# Patient Record
Sex: Male | Born: 1965 | Race: Black or African American | Hispanic: No | Marital: Married | Smoking: Never smoker
Health system: Southern US, Community
[De-identification: ages and names within clinical notes are randomized; demographics above are authoritative.]

## PROBLEM LIST (undated history)

## (undated) DIAGNOSIS — C9590 Leukemia, unspecified not having achieved remission: Secondary | ICD-10-CM

## (undated) DIAGNOSIS — E785 Hyperlipidemia, unspecified: Secondary | ICD-10-CM

## (undated) DIAGNOSIS — I10 Essential (primary) hypertension: Secondary | ICD-10-CM

## (undated) HISTORY — DX: Hyperlipidemia, unspecified: E78.5

## (undated) HISTORY — PX: APPENDECTOMY: SHX54

## (undated) HISTORY — PX: CHOLECYSTECTOMY: SHX55

---

## 2003-07-23 ENCOUNTER — Emergency Department (HOSPITAL_COMMUNITY): Admission: AD | Admit: 2003-07-23 | Discharge: 2003-07-23 | Payer: Self-pay | Admitting: Emergency Medicine

## 2003-07-23 ENCOUNTER — Encounter: Payer: Self-pay | Admitting: Emergency Medicine

## 2005-01-21 ENCOUNTER — Inpatient Hospital Stay (HOSPITAL_COMMUNITY): Admission: EM | Admit: 2005-01-21 | Discharge: 2005-01-25 | Payer: Self-pay | Admitting: *Deleted

## 2009-10-24 ENCOUNTER — Inpatient Hospital Stay (HOSPITAL_COMMUNITY): Admission: EM | Admit: 2009-10-24 | Discharge: 2009-11-05 | Payer: Self-pay | Admitting: Emergency Medicine

## 2011-01-14 IMAGING — CT CT PELVIS W/ CM
1 of 3 series · 14 of 32 positions shown, 19 images · IV contrast (Omnipaque 300)
Comparison: 01/22/2005.

CT ABDOMEN

CLINICAL DATA: Nausea and vomiting for 2 days.  Abdominal pain.
Status post appendectomy and cholecystectomy.

CT ABDOMEN AND PELVIS WITH CONTRAST
TECHNIQUE: Multidetector CT imaging of the abdomen and pelvis was
performed using the standard protocol following bolus
administration of intravenous contrast.
Contrast: 100 ml [id number].

[Series 2: abd_pel_with 5.0 b40f · axial · 0.66mm/px · z∈[-508,-108]mm · 14 of 91 slices shown, 19 images]
[im 6/91  soft-tissue]
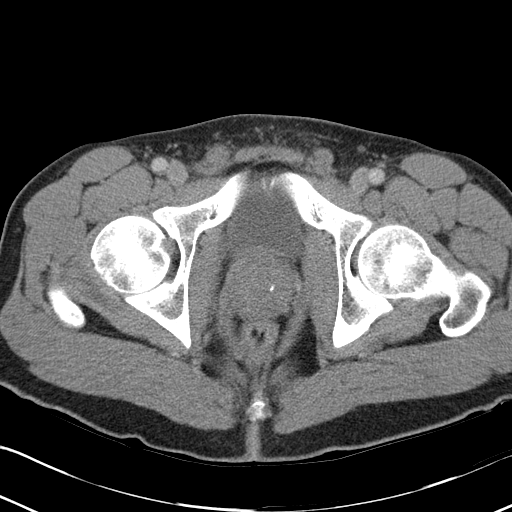
[im 6/91  bone]
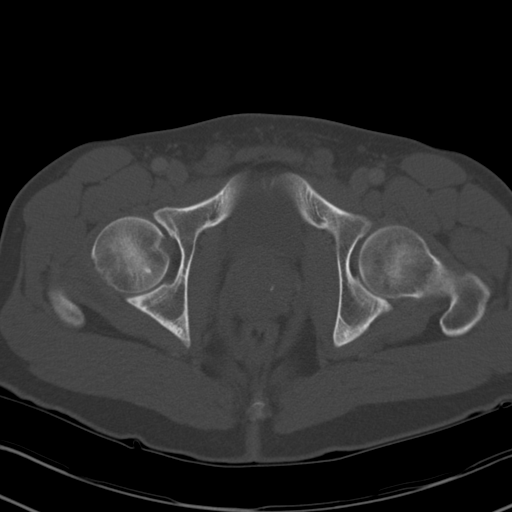
[im 11/91  soft-tissue]
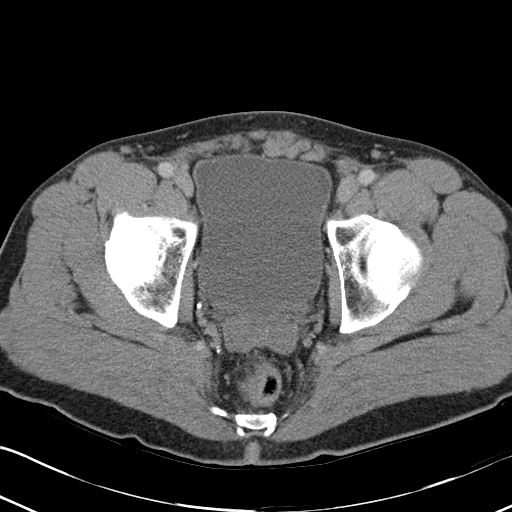
[im 21/91  soft-tissue]
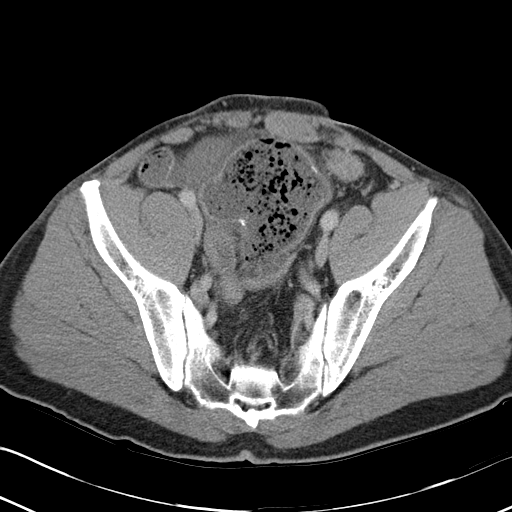
[im 26/91  soft-tissue]
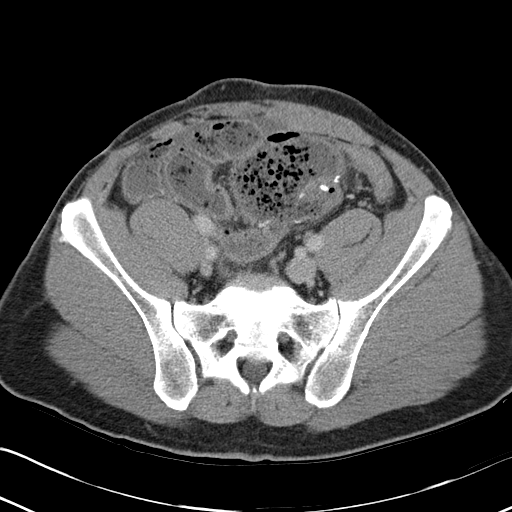
[im 31/91  soft-tissue]
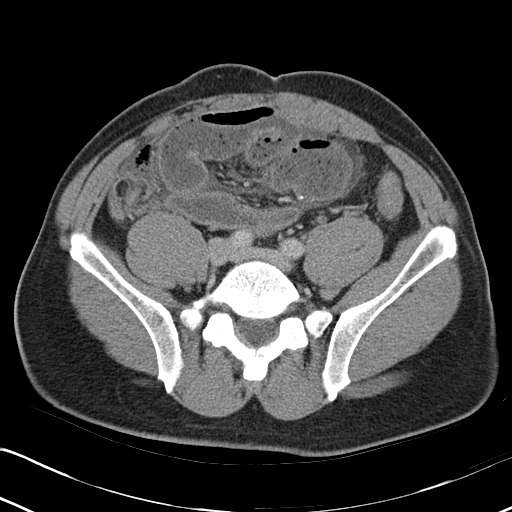
[im 41/91  soft-tissue]
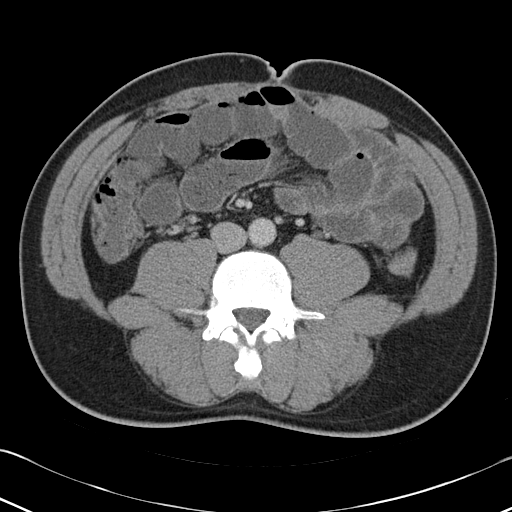
[im 46/91  soft-tissue]
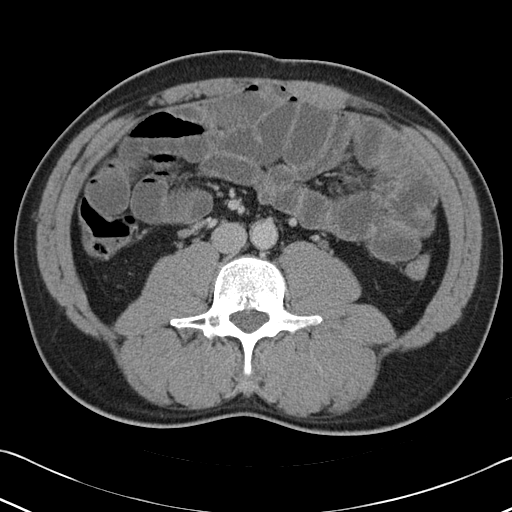
[im 51/91  soft-tissue]
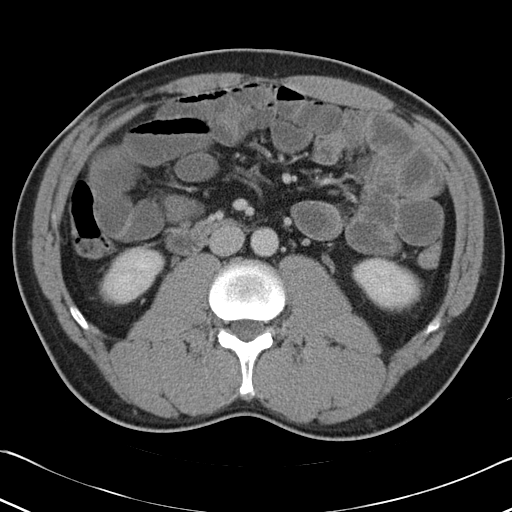
[im 61/91  soft-tissue]
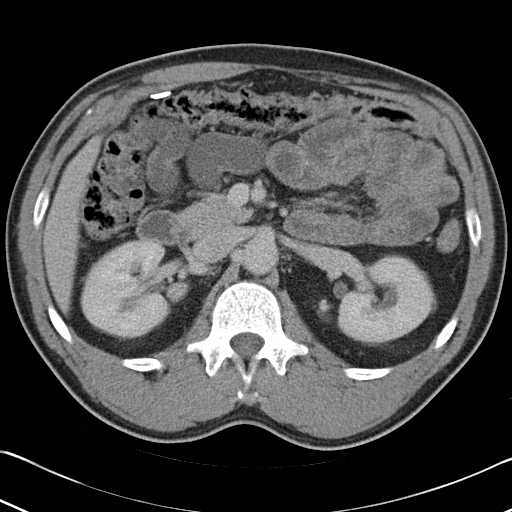
[im 61/91  bone]
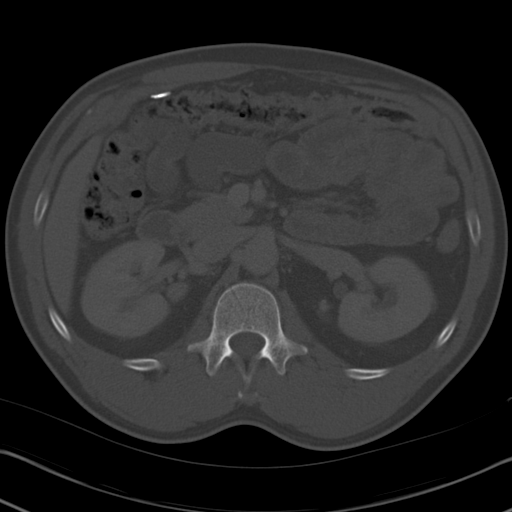
[im 66/91  soft-tissue]
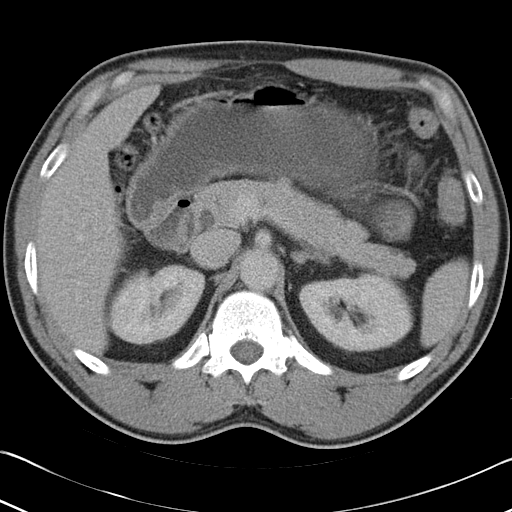
[im 71/91  soft-tissue]
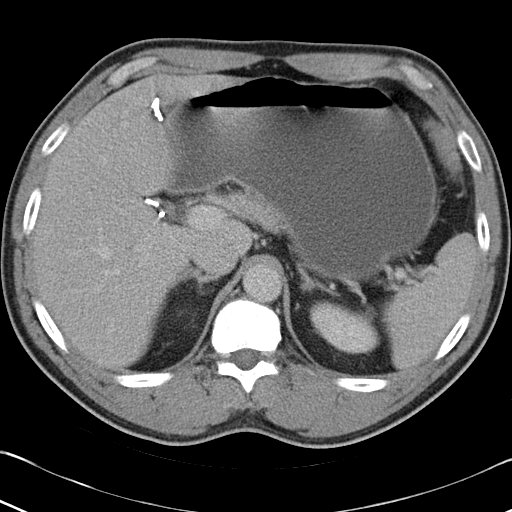
[im 71/91  lung]
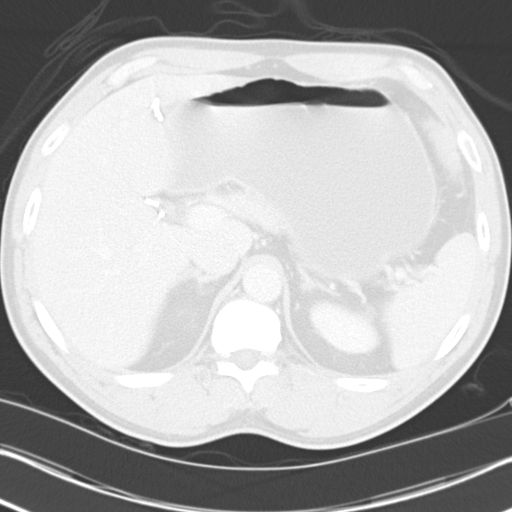
[im 76/91  lung]
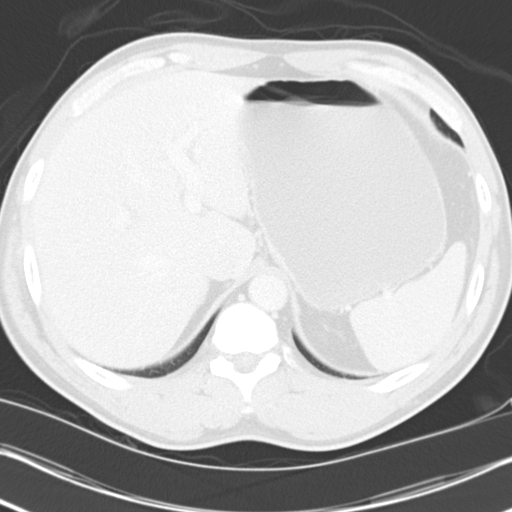
[im 81/91  soft-tissue]
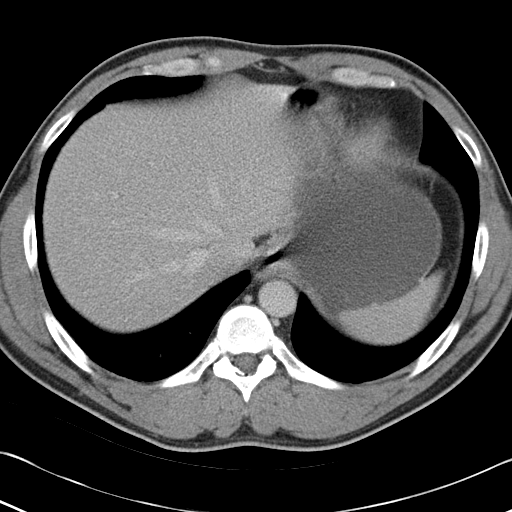
[im 81/91  lung]
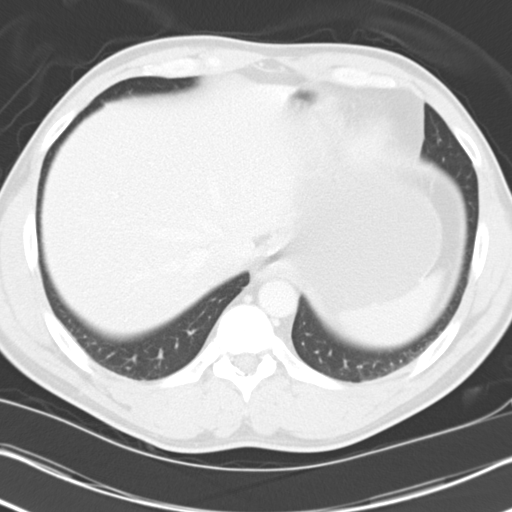
[im 86/91  soft-tissue]
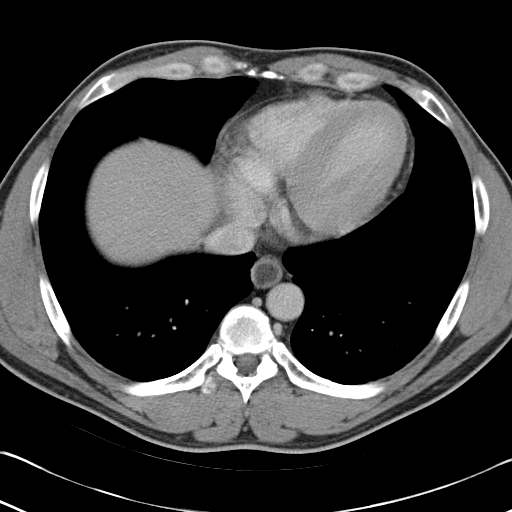
[im 86/91  lung]
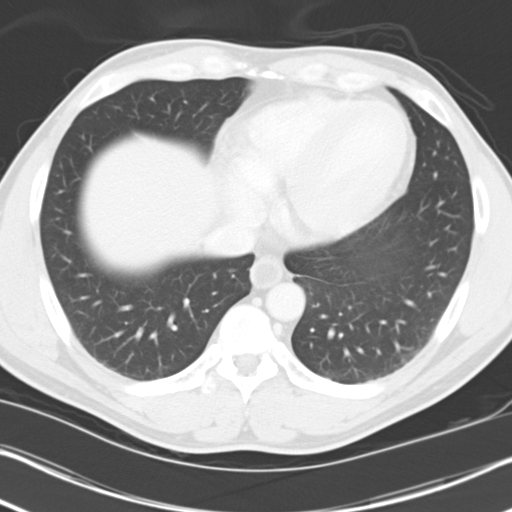

[14 of 32 positions shown; findings below may reference images not displayed]

FINDINGS: Minimal atelectatic changes lung bases.  Status post
cholecystectomy.  No focal hepatic, splenic, pancreatic or adrenal
lesion.  Duplicated left renal collecting system tear within the
left upper pole there is a 1.1 cm low density structure which
cannot be confirmed as a simple cyst but was present previously and
does not appear larger.  There are scattered smaller sub centimeter
low density lesions within both kidneys too small to adequately
characterize.

No abdominal aortic aneurysm.

Fluid filled dilated distal esophagus, stomach and small bowel to
the level of the right lower quadrant.  The right colon is
decompressed.  It is possible this small bowel obstruction is
related to adhesions.  No free intraperitoneal air or abscess
collection.  No bony destructive lesion.
IMPRESSION: Small bowel obstructive pattern.  Question if this is related to
adhesions?

Sub centimeter renal lesions too small to adequately characterize.

CT PELVIS
FINDINGS: Status post colonic surgery.  Moderate amount of stool
in the rectosigmoid region at the level of the surgery.
Decompressed remainder of colon.  Bladder appears to be grossly
within normal limits.
IMPRESSION: Prior colonic surgery with prominent stool at the surgical site
(question if the patient had pouch formation).  The remainder of
the colon is decompressed.

## 2011-02-14 LAB — BASIC METABOLIC PANEL
BUN: 10 mg/dL (ref 6–23)
BUN: 11 mg/dL (ref 6–23)
BUN: 6 mg/dL (ref 6–23)
BUN: 6 mg/dL (ref 6–23)
CO2: 25 mEq/L (ref 19–32)
CO2: 27 mEq/L (ref 19–32)
Calcium: 8.4 mg/dL (ref 8.4–10.5)
Calcium: 8.4 mg/dL (ref 8.4–10.5)
Calcium: 8.6 mg/dL (ref 8.4–10.5)
Chloride: 100 mEq/L (ref 96–112)
Chloride: 100 mEq/L (ref 96–112)
Creatinine, Ser: 0.63 mg/dL (ref 0.4–1.5)
Creatinine, Ser: 0.86 mg/dL (ref 0.4–1.5)
Creatinine, Ser: 0.88 mg/dL (ref 0.4–1.5)
GFR calc Af Amer: >60 mL/min (ref >60)
GFR calc Af Amer: >60 mL/min (ref >60)
GFR calc Af Amer: >60 mL/min (ref >60)
GFR calc Af Amer: >60 mL/min (ref >60)
GFR calc non Af Amer: >60 mL/min (ref >60)
GFR calc non Af Amer: >60 mL/min (ref >60)
GFR calc non Af Amer: >60 mL/min (ref >60)
Glucose, Bld: 128 mg/dL — ABNORMAL HIGH (ref 70–99)
Glucose, Bld: 144 mg/dL — ABNORMAL HIGH (ref 70–99)
Glucose, Bld: 78 mg/dL (ref 70–99)
Potassium: 3.4 mEq/L — ABNORMAL LOW (ref 3.5–5.1)
Potassium: 3.6 mEq/L (ref 3.5–5.1)
Potassium: 3.8 mEq/L (ref 3.5–5.1)
Sodium: 133 mEq/L — ABNORMAL LOW (ref 135–145)
Sodium: 135 mEq/L (ref 135–145)
Sodium: 137 mEq/L (ref 135–145)

## 2011-02-14 LAB — DIFFERENTIAL
Basophils Absolute: 0 10*3/uL (ref 0.0–0.1)
Basophils Absolute: 0 10*3/uL (ref 0.0–0.1)
Basophils Absolute: 0 10*3/uL (ref 0.0–0.1)
Basophils Absolute: 0 10*3/uL (ref 0.0–0.1)
Basophils Relative: 0 % (ref 0–1)
Basophils Relative: 0 % (ref 0–1)
Basophils Relative: 0 % (ref 0–1)
Eosinophils Absolute: 0.1 10*3/uL (ref 0.0–0.7)
Eosinophils Absolute: 0.1 10*3/uL (ref 0.0–0.7)
Eosinophils Relative: 2 % (ref 0–5)
Eosinophils Relative: 2 % (ref 0–5)
Lymphocytes Relative: 11 % — ABNORMAL LOW (ref 12–46)
Lymphs Abs: 0.8 10*3/uL (ref 0.7–4.0)
Lymphs Abs: 0.9 10*3/uL (ref 0.7–4.0)
Monocytes Absolute: 0.8 10*3/uL (ref 0.1–1.0)
Monocytes Absolute: 1.2 10*3/uL — ABNORMAL HIGH (ref 0.1–1.0)
Monocytes Relative: 12 % (ref 3–12)
Monocytes Relative: 13 % — ABNORMAL HIGH (ref 3–12)
Monocytes Relative: 15 % — ABNORMAL HIGH (ref 3–12)
Neutro Abs: 4.4 10*3/uL (ref 1.7–7.7)
Neutro Abs: 4.9 10*3/uL (ref 1.7–7.7)
Neutro Abs: 5.9 10*3/uL (ref 1.7–7.7)
Neutrophils Relative %: 70 % (ref 43–77)
Neutrophils Relative %: 72 % (ref 43–77)
Neutrophils Relative %: 78 % — ABNORMAL HIGH (ref 43–77)

## 2011-02-14 LAB — CBC
HCT: 33 % — ABNORMAL LOW (ref 39.0–52.0)
HCT: 35.8 % — ABNORMAL LOW (ref 39.0–52.0)
HCT: 36.5 % — ABNORMAL LOW (ref 39.0–52.0)
HCT: 39.7 % (ref 39.0–52.0)
Hemoglobin: 11.1 g/dL — ABNORMAL LOW (ref 13.0–17.0)
Hemoglobin: 11.9 g/dL — ABNORMAL LOW (ref 13.0–17.0)
Hemoglobin: 12.2 g/dL — ABNORMAL LOW (ref 13.0–17.0)
Hemoglobin: 13 g/dL (ref 13.0–17.0)
MCHC: 32.8 g/dL (ref 30.0–36.0)
MCHC: 33.8 g/dL (ref 30.0–36.0)
MCV: 85.6 fL (ref 78.0–100.0)
MCV: 85.6 fL (ref 78.0–100.0)
MCV: 86 fL (ref 78.0–100.0)
Platelets: 159 10*3/uL (ref 150–400)
Platelets: 169 10*3/uL (ref 150–400)
Platelets: 181 10*3/uL (ref 150–400)
RBC: 3.85 MIL/uL — ABNORMAL LOW (ref 4.22–5.81)
RBC: 4.23 MIL/uL (ref 4.22–5.81)
RDW: 12.9 % (ref 11.5–15.5)
RDW: 13 % (ref 11.5–15.5)
RDW: 13.2 % (ref 11.5–15.5)
RDW: 13.6 % (ref 11.5–15.5)
WBC: 6.3 10*3/uL (ref 4.0–10.5)
WBC: 6.8 10*3/uL (ref 4.0–10.5)

## 2011-02-14 LAB — MAGNESIUM
Magnesium: 1.8 mg/dL (ref 1.5–2.5)
Magnesium: 1.8 mg/dL (ref 1.5–2.5)

## 2011-02-14 LAB — PHOSPHORUS: Phosphorus: 4.2 mg/dL (ref 2.3–4.6)

## 2011-02-15 LAB — URINE MICROSCOPIC-ADD ON

## 2011-02-15 LAB — CBC
HCT: 38.1 % — ABNORMAL LOW (ref 39.0–52.0)
HCT: 42 % (ref 39.0–52.0)
Hemoglobin: 12.8 g/dL — ABNORMAL LOW (ref 13.0–17.0)
Hemoglobin: 14 g/dL (ref 13.0–17.0)
MCHC: 33.3 g/dL (ref 30.0–36.0)
MCHC: 33.7 g/dL (ref 30.0–36.0)
MCV: 85.6 fL (ref 78.0–100.0)
MCV: 86.4 fL (ref 78.0–100.0)
Platelets: 158 10*3/uL (ref 150–400)
Platelets: 160 10*3/uL (ref 150–400)
RDW: 12.9 % (ref 11.5–15.5)
RDW: 13 % (ref 11.5–15.5)
WBC: 10.9 10*3/uL — ABNORMAL HIGH (ref 4.0–10.5)

## 2011-02-15 LAB — DIFFERENTIAL
Basophils Absolute: 0 10*3/uL (ref 0.0–0.1)
Basophils Relative: 0 % (ref 0–1)
Basophils Relative: 0 % (ref 0–1)
Eosinophils Absolute: 0 10*3/uL (ref 0.0–0.7)
Eosinophils Relative: 1 % (ref 0–5)
Lymphs Abs: 0.5 10*3/uL — ABNORMAL LOW (ref 0.7–4.0)
Lymphs Abs: 1.2 10*3/uL (ref 0.7–4.0)
Monocytes Absolute: 0.2 10*3/uL (ref 0.1–1.0)
Monocytes Absolute: 0.7 10*3/uL (ref 0.1–1.0)
Monocytes Relative: 2 % — ABNORMAL LOW (ref 3–12)
Neutro Abs: 7.7 10*3/uL (ref 1.7–7.7)
Neutrophils Relative %: 82 % — ABNORMAL HIGH (ref 43–77)
Neutrophils Relative %: 91 % — ABNORMAL HIGH (ref 43–77)

## 2011-02-15 LAB — URINALYSIS, ROUTINE W REFLEX MICROSCOPIC
Bilirubin Urine: NEGATIVE
Glucose, UA: NEGATIVE mg/dL
Leukocytes, UA: NEGATIVE
Protein, ur: NEGATIVE mg/dL
Specific Gravity, Urine: 1.015 (ref 1.005–1.030)
Urobilinogen, UA: 0.2 mg/dL (ref 0.0–1.0)
pH: 7 (ref 5.0–8.0)

## 2011-02-15 LAB — BASIC METABOLIC PANEL
BUN: 7 mg/dL (ref 6–23)
CO2: 25 mEq/L (ref 19–32)
Calcium: 8.6 mg/dL (ref 8.4–10.5)
Chloride: 100 mEq/L (ref 96–112)
Creatinine, Ser: 0.83 mg/dL (ref 0.4–1.5)
Glucose, Bld: 82 mg/dL (ref 70–99)
Potassium: 4 mEq/L (ref 3.5–5.1)
Sodium: 137 mEq/L (ref 135–145)

## 2011-02-15 LAB — STONE ANALYSIS: Stone Weight KSTONE: 0.114 g

## 2011-02-15 LAB — COMPREHENSIVE METABOLIC PANEL
Albumin: 4.1 g/dL (ref 3.5–5.2)
BUN: 8 mg/dL (ref 6–23)
Calcium: 9.4 mg/dL (ref 8.4–10.5)
Glucose, Bld: 119 mg/dL — ABNORMAL HIGH (ref 70–99)
Sodium: 137 mEq/L (ref 135–145)
Total Protein: 8 g/dL (ref 6.0–8.3)

## 2011-02-15 LAB — LACTIC ACID, PLASMA: Lactic Acid, Venous: 2.7 mmol/L — ABNORMAL HIGH (ref 0.5–2.2)

## 2011-02-15 LAB — URINE CULTURE
Colony Count: NO GROWTH
Culture: NO GROWTH

## 2011-04-01 NOTE — Discharge Summary (Signed)
Bryce Hamilton, Bryce Hamilton               ACCOUNT NO.:  [id number]   MEDICAL RECORD NO.:  [id number]          PATIENT TYPE:  INP   LOCATION:  A226                          FACILITY:  [facility]   PHYSICIAN:  Osvaldo Shipper, MD     DATE OF BIRTH:  1966-08-06   DATE OF ADMISSION:  01/21/2005  DATE OF DISCHARGE:  03/14/2006LH                                 DISCHARGE SUMMARY   PRIMARY CARE PHYSICIAN:  The patient does not have a primary care doctor.   DISCHARGE DIAGNOSIS:  Adynamic ileus versus partial small bowel obstruction.   HISTORY OF PRESENT ILLNESS:  Please review the history and physical dictated  on January 21, 2005 for details regarding the patient's presenting illness.   BRIEF HOSPITAL COURSE:  The patient is a 45 year old African-American male  with no significant medical history, however, history of abdominal surgeries  including appendectomy and cholecystectomy and for adhesions in the past.  The patient presented with acute abdominal pain and nausea.  The patient  also had a few episodes of emesis.  The patient was admitted with diagnosis  of partial small bowel obstruction versus ileus.  The patient was kept NPO,  was started on antiemetics.  He was put on intravenous fluids.  Dr. Elpidio Anis was consulted to see the patient.  The patient underwent serial  abdominal series, also underwent a CT scan of his abdomen as well as a small  bowel follow through.  All of these studies suggested improvement in his  obstruction and actually the small bowel follow through showed slow transit  time but no evidence for any acute obstruction.  The patient also improved  clinically over the course of this admission with all of his symptoms  resolving.  He was started on a p.o. diet last night and he was tolerated  the dinner as well as breakfast at the time of discharge.  His vital signs  are all stable.  He does not have any nausea or vomiting or any abdominal  pain and he is considered stable for  discharge.   DISCHARGE MEDICATIONS:  The only thing patient needs to take is Reglan 10 mg  q.a.c. and q.h.s.   DIET:  Regular.   ACTIVITY:  As tolerated.   FOLLOW UP CARE:  Follow up with Dr. Elpidio Anis in two to three weeks.  An  appointment will be set up for him.   The patient has been given a note to stay off work until Monday, January 31, 2005.      GK/MEDQ  D:  01/25/2005  T:  01/25/2005  Job:  [id number]   cc:   Dirk Dress. Katrinka Blazing, M.D.  [address]  [place]  [place] [address]  Fax: [phone]

## 2011-04-01 NOTE — Consult Note (Signed)
[name], Hamilton               ACCOUNT NO.:  [id number]   MEDICAL RECORD NO.:  [id number]          PATIENT TYPE:  INP   LOCATION:  A226                          FACILITY:  [facility]   PHYSICIAN:  Dirk Dress. Katrinka Blazing, M.D.   DATE OF BIRTH:  02/14/1966   DATE OF CONSULTATION:  01/21/2005  DATE OF DISCHARGE:                                   CONSULTATION   Bryce Hamilton is a 45 year old male admitted for evaluation of severe  abdominal pain with pattern suggestive of either partial obstruction or  ileus.  The patient gives a history of having onset of severe cramping,  suprapubic abdominal pain after eating on the evening of 8 March.  The pain  progressed throughout the night, but he tried to go to work the next day.  He had severe cramping, abdominal pain, with nausea and vomiting on that  night and in the morning and was unable to work.  He returned from work and  was able to go to bed but had persistent pain with nausea and vomiting.  He  was seen in the emergency room in the early morning of 10 March with  complaint of diffuse crampy abdominal pain and nausea.  His workup  suggestive adynamic ileus, and the patient is admitted for evaluation.   PAST MEDICAL HISTORY:  1.  The patient had a ruptured appendix at age 41.  Based on his abdominal      scars and his history, he had total abdominal peritonitis requiring      continuous peritoneal lavage and extensive drainage.  He was      hospitalized for greater than two months at that time.  He recovered      from that and has done relatively well except that he has had two      exploratory laparotomies for small-bowel obstruction, all related to the      major inflammatory process in the right lower quadrant according to the      patient.  2.  He had a cholecystectomy done which, according to history, was done      laparoscopically and was done uneventfully.  This is remarkable in that      his history suggests that he had acute  peritonitis.   His medical history otherwise is unremarkable.  He has no other medical  illnesses, and he takes no chronic medications.   PHYSICAL EXAMINATION:  GENERAL:  He is lying quietly in bed.  He does not  appear to be in any acute distress.  VITAL SIGNS:  Blood pressure 139/88, pulse 90, respirations 18.  Temperature  98.2.  HEENT:  Unremarkable.  NECK:  Supple.  No JVD, bruit, adenopathy, or thyromegaly.  CHEST:  Clear to auscultation.  No rales, rubs, rhonchi, or wheezes.  HEART:  Regular rate and rhythm without murmur, gallop, or rub.  Normal S1  and S2.  ABDOMEN:  Nondistended.  He has no tenderness by palpation.  He has  hyperactive bowel sounds.  The patient has a right upper quadrant transverse  midline incision extending from mid epigastric area down to  the suprapubic  area.  There is a right lower quadrant paramedian incision, a tangential  incision in both lower quadrants following the course of the inguinal  ligament.  He has multiple healed puncture sites from suspected previous  drains.  There is no evidence of hernia except the superior-most aspect of  the midline incision bulges would be suspect.  EXTREMITIES:  No cyanosis, clubbing, or edema.  NEUROLOGIC:  Nonfocal.   LABORATORY DATA AND OTHER STUDIES:  Abdominal series shows dilated colon and  small bowel.  There is gas in the colon, starting in the cecum and extending  all the way down into the rectum.  There are dilated loops of small bowel  scattered throughout the mid abdomen.  On the upright film, there are  differential air/fluid levels, and the colon is well into the small bowel.   IMPRESSION:  Suspected adynamic ileus due to gastroenteritis  Doubt complete  bowel obstruction at this time.  There is the possibility of partial  obstruction; however, with the extensive amount of gas in the colon, it is  quite apparent that some gas is getting through.   RECOMMENDATIONS:  The patient should have serial  abdominal series.  If he is  not clinically improved in the morning, CT of the abdomen with oral contrast  should be done to check the possibility of strictured small bowel.  If he  should develop nausea and vomiting, a nasogastric tube  with decompression  of his stomach and the proximal small bowel probably will facilitate his  improvement.  Reglan has been added IV. This only works in the stomach and  the proximal small bowel, but increased propulsive force proximally usually  helps emptying facility.  I will follow the patient closely, but I doubt if  he will need surgical therapy.      LCS/MEDQ  D:  01/21/2005  T:  01/21/2005  Job:  [id number]

## 2011-04-01 NOTE — H&P (Signed)
Bryce Hamilton, Bryce Hamilton               ACCOUNT NO.:  [id number]   MEDICAL RECORD NO.:  [id number]          PATIENT TYPE:  INP   LOCATION:  A226                          FACILITY:  [facility]   PHYSICIAN:  Margaretmary Dys, M.D.DATE OF BIRTH:  1966/07/02   DATE OF ADMISSION:  01/21/2005  DATE OF DISCHARGE:  LH                                HISTORY & PHYSICAL   ADMISSION DIAGNOSES:  1.  Acute abdominal pain.  2.  Small-bowel obstruction, likely secondary to adhesions.  3.  Obstipation.   CHIEF COMPLAINT:  Abdominal pain of 3 days' duration.   HISTORY OF PRESENT ILLNESS:  Bryce Hamilton is a 45 year old African  American male who presented to the emergency room earlier this morning,  complaining of diffuse abdominal pain.  He describes the pain as being  generalized all over his abdomen, sharp to crampy-like occasionally, seems  to be partially relieved when he passes gas.  He has not had any bowel  movement since Wednesday night; at the time, his bowel movements were a  little harder than normal.  He denies any diarrhea.  He did have some nausea  and actually vomited on Wednesday night, but has not vomited since.   He denies any fever, chills or rigors.  There is no history of  gastroenteritis in the members of his family.   The patient has had multiple surgeries in the past complicated by adhesions  and actually had 2 surgeries to release these adhesions in the past.   The patient currently rates his pain as a 5-6/10.  He also feels bloated.  The pain radiates to the back occasionally.   He says the pain is a little different in character from what he had when he  had adhesion surgery.  He did note that he had a lot of vomiting then.   He has no cough, no shortness of breath.  No headache, dizziness or  lightheadedness.  No skin changes.  No pedal edema.   REVIEW OF SYSTEMS:  A 10-point review of systems was otherwise negative  except as mentioned in the history of present  illness above.   PAST MEDICAL HISTORY:  1.  Ruptured appendix when he was 45 years old.  2.  Gallbladder surgery with cholecystectomy.  3.  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   He denies any history of hypertension, diabetes or cholesterol.   MEDICATIONS:  He does not take any medicines.   FAMILY HISTORY:  His father died from leukemia at the age of 11.  The mother  is 65 years old and she is alive and well.  He has 5 other siblings who are  doing very well as far as he knows.   ALLERGIES:  He has no known drug allergies.   SOCIAL HISTORY:  He is married.  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  He has 3 kids who are healthy with no medical problems.  He  does not smoke, does not drink alcohol, denies any IV drug abuse.   PHYSICAL EXAM:  GENERAL:  Conscious, alert, comfortable, not in acute  distress.  VITAL SIGNS:  Blood pressure was 123/77, pulse of 88, respiratory rate was  18, temperature 97.7.  Oxygen SATS were 100% on room air.  HEENT:  Normocephalic, atraumatic.  Oral mucosa was moist with no exudates.  No thrush was noted.  NECK:  Neck was supple with no JVD or lymphadenopathy.  LUNGS:  Lungs were clear clinically with good air entry bilaterally.  HEART:  S1 and S2, regular.  No S3, S4, gallops or rubs.  ABDOMEN:  Abdomen was not distended.  The patient had multiple scars, both  around the McBurney's point, infra- and supraumbilical areas and over his  liver.  He has diffuse tenderness on palpation.  He had no rebound.  Bowel  sounds were hypoactive.  He had no guarding or rigidity.  EXTREMITIES:  No pitting pedal edema and no calf induration or tenderness  were noted.   LABORATORY DATA:  White blood cell count was 7.6, hemoglobin 14.5,  hematocrit 41.5, platelet count 201,000.  There was no left shift.  Sodium  was 129, potassium 3.7, chloride of 99, CO2 27, glucose 117, BUN of 6,  creatinine 0.9, total bilirubin 1.5.  Alkaline  phosphatase 57, AST is 22,  ALT of 32, total protein 7.4, albumin 3.9, calcium is 8.8, amylase 159,  lipase of 22.  Urinalysis:  Yellow, hazy in appearance; trace ketones; trace  protein.   Abdominal series show distended loops of bowel.  Official reading is still  pending.   ASSESSMENT AND PLAN:  Bryce Hamilton is a 45 year old African American  presenting with acute abdominal pain, crampy in nature, associated with some  obstipation.  The patient also had a single episode of vomiting on the night  of onset.   The patient likely has a small-bowel obstruction, as indicated by his  abdominal series.  His small-bowel obstruction is likely secondary to  adhesions from prior surgery.  The patient is not significantly obstructed  at this time.  He continues to pass some gas with relief of his abdominal  cramping.   He also does not have significant air-fluid levels on his abdominal series.   Plan will be to admit him to 2A for closer monitoring.  I am concerned from  the patient's prior surgeries that he might need surgery again, although at  this time, I do not see any indication for it.  We probably can manage him  conservatively.  I would not put an nasogastric tube at this time, as he is  not significantly distended and is not vomiting.  I will keep him nothing-by-  mouth, but will allow him to have ice chips.  I will hydrate him with fluids  of normal saline at 100 mL/hr with potassium chloride in it.   I will request surgical consult with Dr. Jerolyn Shin C. Katrinka Hamilton as an advance step  so he is aware of the patient in case his obstruction worsens.   The patient is mostly healthy and does not take any chronic medications.   I have explained the overall plan to the patient, who is fairly familiar  with the plan, as he has had 2 bowel obstructions in the past.   I also explained the role of the hospitalists' service in his care.  The patient verbalized full understanding.      AM/MEDQ   D:  01/21/2005  T:  01/21/2005  Job:  [id number]

## 2012-02-12 ENCOUNTER — Emergency Department (HOSPITAL_COMMUNITY)
Admission: EM | Admit: 2012-02-12 | Discharge: 2012-02-12 | Disposition: A | Discharge status: Home / Self Care | Payer: BC Managed Care – PPO | Attending: Emergency Medicine | Admitting: Emergency Medicine

## 2012-02-12 ENCOUNTER — Encounter (HOSPITAL_COMMUNITY): Payer: Self-pay | Admitting: *Deleted

## 2012-02-12 DIAGNOSIS — H73019 Bullous myringitis, unspecified ear: Secondary | ICD-10-CM | POA: Insufficient documentation

## 2012-02-12 DIAGNOSIS — R030 Elevated blood-pressure reading, without diagnosis of hypertension: Secondary | ICD-10-CM | POA: Insufficient documentation

## 2012-02-12 DIAGNOSIS — H73012 Bullous myringitis, left ear: Secondary | ICD-10-CM

## 2012-02-12 DIAGNOSIS — R000 Tachycardia, unspecified: Secondary | ICD-10-CM | POA: Insufficient documentation

## 2012-02-12 MED ORDER — AZITHROMYCIN 250 MG PO TABS: 250.0000 mg | ORAL_TABLET | Freq: Every day | ORAL | Status: AC

## 2012-02-12 MED ORDER — OXYCODONE-ACETAMINOPHEN 5-325 MG PO TABS: 1.0000 | ORAL_TABLET | ORAL | Status: AC | PRN

## 2012-02-12 MED ORDER — AZITHROMYCIN 250 MG PO TABS
500.0000 mg | ORAL_TABLET | Freq: Once | ORAL | Status: AC
  Administered 2012-02-12: 500 mg via ORAL
  Filled 2012-02-12: qty 2

## 2012-02-12 NOTE — Discharge Instructions (Signed)
Bullous Myringitis You have an infection of the ear drum called myringitis. Bullous means blisters have formed. These can produce clear or slightly bloody ear drainage. This infection can be caused by both viruses and germs (bacteria). Symptoms of myringitis include severe earache, slight hearing loss, and clear or bloody drainage from the ear. It is different from most ear infections in that there is no fluid trapped behind the ear drum. Treatment often includes antibiotic ear drops and pain medicine. Sometimes an oral antibiotic may be prescribed. Until the infection is resolved, you should keep water from entering your ear by plugging it with a cotton ball covered with petroleum jelly when you shower.  SEEK MEDICAL CARE IF:  You develop a cough or other symptoms of a respiratory infection.   Your symptoms are not improving after 2 days of treatment.   You develop a severe headache or stiff neck.  Document Released: 12/08/2004 Document Revised: 10/20/2011 Document Reviewed: 10/28/2008 [facility] Patient Information 2012 [place], [place].    Azithromycin tablets What is this medicine? AZITHROMYCIN (az ith roe MYE sin) is a macrolide antibiotic. It is used to treat or prevent certain kinds of bacterial infections. It will not work for colds, flu, or other viral infections. This medicine may be used for other purposes; ask your health care provider or pharmacist if you have questions. What should I tell my health care provider before I take this medicine? They need to know if you have any of these conditions: -kidney disease -liver disease -irregular heartbeat or heart disease -an unusual or allergic reaction to azithromycin, erythromycin, other macrolide antibiotics, foods, dyes, or preservatives -pregnant or trying to get pregnant -breast-feeding How should I use this medicine? Take this medicine by mouth with a full glass of water. Follow the directions on the prescription label. The  tablets can be taken with food or on an empty stomach. If the medicine upsets your stomach, take it with food. Take your medicine at regular intervals. Do not take your medicine more often than directed. Take all of your medicine as directed even if you think your are better. Do not skip doses or stop your medicine early. Talk to your pediatrician regarding the use of this medicine in children. Special care may be needed. Overdosage: If you think you have taken too much of this medicine contact a poison control center or emergency room at once. NOTE: This medicine is only for you. Do not share this medicine with others. What if I miss a dose? If you miss a dose, take it as soon as you can. If it is almost time for your next dose, take only that dose. Do not take double or extra doses. What may interact with this medicine? Do not take this medicine with any of the following medications: -lincomycin This medicine may also interact with the following medications: -amiodarone -antacids -cyclosporine -digoxin -dihydroergotamine or ergotamine -magnesium -nelfinavir -phenytoin -warfarin This list may not describe all possible interactions. Give your health care provider a list of all the medicines, herbs, non-prescription drugs, or dietary supplements you use. Also tell them if you smoke, drink alcohol, or use illegal drugs. Some items may interact with your medicine. What should I watch for while using this medicine? Tell your doctor or health care professional if your symptoms do not improve. Do not treat diarrhea with over the counter products. Contact your doctor if you have diarrhea that lasts more than 2 days or if it is severe and watery. This medicine can  make you more sensitive to the sun. Keep out of the sun. If you cannot avoid being in the sun, wear protective clothing and use sunscreen. Do not use sun lamps or tanning beds/booths. What side effects may I notice from receiving this  medicine? Side effects that you should report to your doctor or health care professional as soon as possible: -allergic reactions like skin rash, itching or hives, swelling of the face, lips, or tongue -confusion, nightmares or hallucinations -dark urine -difficulty breathing -hearing loss -irregular heartbeat or chest pain -pain or difficulty passing urine -redness, blistering, peeling or loosening of the skin, including inside the mouth -white patches or sores in the mouth -yellowing of the eyes or skin Side effects that usually do not require medical attention (report to your doctor or health care professional if they continue or are bothersome): -diarrhea -dizziness, drowsiness -headache -stomach upset or vomiting -tooth discoloration -vaginal irritation This list may not describe all possible side effects. Call your doctor for medical advice about side effects. You may report side effects to FDA at 1-800-FDA-1088. Where should I keep my medicine? Keep out of the reach of children. Store at room temperature between 15 and 30 degrees C (59 and 86 degrees F). Throw away any unused medicine after the expiration date. NOTE: This sheet is a summary. It may not cover all possible information. If you have questions about this medicine, talk to your doctor, pharmacist, or health care provider.  2012, [employer]vier/[employer] Standard. (01/22/2008 1:50:13 PM)  Acetaminophen; Oxycodone tablets What is this medicine? ACETAMINOPHEN; OXYCODONE (a set a MEE noe fen; ox i KOE done) is a pain reliever. It is used to treat mild to moderate pain. This medicine may be used for other purposes; ask your health care provider or pharmacist if you have questions. What should I tell my health care provider before I take this medicine? They need to know if you have any of these conditions: -brain tumor -Crohn's disease, inflammatory bowel disease, or ulcerative colitis -drink more than 3 alcohol containing drinks  per day -drug abuse or addiction -head injury -heart or circulation problems -kidney disease or problems going to the bathroom -liver disease -lung disease, asthma, or breathing problems -an unusual or allergic reaction to acetaminophen, oxycodone, other opioid analgesics, other medicines, foods, dyes, or preservatives -pregnant or trying to get pregnant -breast-feeding How should I use this medicine? Take this medicine by mouth with a full glass of water. Follow the directions on the prescription label. Take your medicine at regular intervals. Do not take your medicine more often than directed. Talk to your pediatrician regarding the use of this medicine in children. Special care may be needed. Patients over 70 years old may have a stronger reaction and need a smaller dose. Overdosage: If you think you have taken too much of this medicine contact a poison control center or emergency room at once. NOTE: This medicine is only for you. Do not share this medicine with others. What if I miss a dose? If you miss a dose, take it as soon as you can. If it is almost time for your next dose, take only that dose. Do not take double or extra doses. What may interact with this medicine? -alcohol or medicines that contain alcohol -antihistamines -barbiturates like amobarbital, butalbital, butabarbital, methohexital, pentobarbital, phenobarbital, thiopental, and secobarbital -benztropine -drugs for bladder problems like solifenacin, trospium, oxybutynin, tolterodine, hyoscyamine, and methscopolamine -drugs for breathing problems like ipratropium and tiotropium -drugs for certain stomach or intestine problems  like propantheline, homatropine methylbromide, glycopyrrolate, atropine, belladonna, and dicyclomine -general anesthetics like etomidate, ketamine, nitrous oxide, propofol, desflurane, enflurane, halothane, isoflurane, and sevoflurane -medicines for depression, anxiety, or psychotic  disturbances -medicines for pain like codeine, morphine, pentazocine, buprenorphine, butorphanol, nalbuphine, tramadol, and propoxyphene -medicines for sleep -muscle relaxants -naltrexone -phenothiazines like perphenazine, thioridazine, chlorpromazine, mesoridazine, fluphenazine, prochlorperazine, promazine, and trifluoperazine -scopolamine -trihexyphenidyl This list may not describe all possible interactions. Give your health care provider a list of all the medicines, herbs, non-prescription drugs, or dietary supplements you use. Also tell them if you smoke, drink alcohol, or use illegal drugs. Some items may interact with your medicine. What should I watch for while using this medicine? Tell your doctor or health care professional if your pain does not go away, if it gets worse, or if you have new or a different type of pain. You may develop tolerance to the medicine. Tolerance means that you will need a higher dose of the medication for pain relief. Tolerance is normal and is expected if you take this medicine for a long time. Do not suddenly stop taking your medicine because you may develop a severe reaction. Your body becomes used to the medicine. This does NOT mean you are addicted. Addiction is a behavior related to getting and using a drug for a nonmedical reason. If you have pain, you have a medical reason to take pain medicine. Your doctor will tell you how much medicine to take. If your doctor wants you to stop the medicine, the dose will be slowly lowered over time to avoid any side effects. You may get drowsy or dizzy. Do not drive, use machinery, or do anything that needs mental alertness until you know how this medicine affects you. Do not stand or sit up quickly, especially if you are an older patient. This reduces the risk of dizzy or fainting spells. Alcohol may interfere with the effect of this medicine. Avoid alcoholic drinks. The medicine will cause constipation. Try to have a bowel  movement at least every 2 to 3 days. If you do not have a bowel movement for 3 days, call your doctor or health care professional. Do not take Tylenol (acetaminophen) or medicines that have acetaminophen with this medicine. Too much acetaminophen can be very dangerous. Many nonprescription medicines contain acetaminophen. Always read the labels carefully to avoid taking more acetaminophen. What side effects may I notice from receiving this medicine? Side effects that you should report to your doctor or health care professional as soon as possible: -allergic reactions like skin rash, itching or hives, swelling of the face, lips, or tongue -breathing difficulties, wheezing -confusion -light headedness or fainting spells -severe stomach pain -yellowing of the skin or the whites of the eyes Side effects that usually do not require medical attention (report to your doctor or health care professional if they continue or are bothersome): -dizziness -drowsiness -nausea -vomiting This list may not describe all possible side effects. Call your doctor for medical advice about side effects. You may report side effects to FDA at 1-800-FDA-1088. Where should I keep my medicine? Keep out of the reach of children. This medicine can be abused. Keep your medicine in a safe place to protect it from theft. Do not share this medicine with anyone. Selling or giving away this medicine is dangerous and against the law. Store at room temperature between 20 and 25 degrees C (68 and 77 degrees F). Keep container tightly closed. Protect from light. Flush any unused medicines down the  toilet. Do not use the medicine after the expiration date. NOTE: This sheet is a summary. It may not cover all possible information. If you have questions about this medicine, talk to your doctor, pharmacist, or health care provider.  2012, Elsevier/Gold Standard. (09/29/2008 10:01:21 AM)

## 2012-02-12 NOTE — ED Provider Notes (Signed)
History     CSN: [id number]  Arrival date & time 02/12/12  0405   First MD Initiated Contact with Patient 02/12/12 0451      Chief Complaint  Patient presents with  . Otalgia    (Consider location/radiation/quality/duration/timing/severity/associated sxs/prior treatment) Patient is a 46 y.o. male presenting with ear pain. The history is provided by the patient.  Otalgia  He had a cold last week but had been improving but yesterday he started having pain and decreased hearing in his left ear which have gotten progressively worse. Pain is severe and it was 10 out of 10 at its worst but as 8/10 currently. He did take a dose of ibuprofen which gave him slight relief. A fever but has had chills. He has not had any sweats. Currently, he has no rhinorrhea or sore throat. Pain does radiate to the left side of his face.  History reviewed. No pertinent past medical history.  Past Surgical History  Procedure Date  . Appendectomy   . Cholecystectomy     History reviewed. No pertinent family history.  History  Substance Use Topics  . Smoking status: Never Smoker   . Smokeless tobacco: Not on file  . Alcohol Use: No      Review of Systems  HENT: Positive for ear pain.   All other systems reviewed and are negative.    Allergies  Review of patient's allergies indicates no known allergies.  Home Medications   Current Outpatient Rx  Name Route Sig Dispense Refill  . AZITHROMYCIN 250 MG PO TABS Oral Take 1 tablet (250 mg total) by mouth daily. 4 tablet 0  . OXYCODONE-ACETAMINOPHEN 5-325 MG PO TABS Oral Take 1 tablet by mouth every 4 (four) hours as needed for pain. 6 tablet 0    BP 158/73  Pulse 112  Temp 98.3 F (36.8 C)  Resp 20  Ht 6\' 1"  (1.854 m)  Wt 205 lb (92.987 kg)  BMI 27.05 kg/m2  SpO2 98%  Physical Exam  Nursing note and vitals reviewed.  46 year old male who is resting comfortably and in no acute distress. Vital signs are significant for mild tachycardia  with heart rate of 112, and mild hypertension with blood pressure 150/73. Oxygen saturation is 98% which is normal. Head is normocephalic and atraumatic. PERRLA, EOMI. There's no sinus tenderness. Right tympanic membrane is clear. Left tympanic membrane has erythema and bulla pattern consistent with bullous myringitis. Oropharynx is clear. Neck is nontender and supple without adenopathy. Lungs are clear without rales, wheezes, rhonchi. Heart has regular rate rhythm without murmur. Abdomen is soft, flat, nontender without masses or hepatosplenomegaly. Extremities have full range of motion, no cyanosis or edema. Skin is warm and dry without rash. Neurologic: Mental status is normal, cranial nerves are intact, there no focal motor or sensory deficits.  ED Course  Procedures (including critical care time)    1. Bullous myringitis of left ear       MDM  Bullous myringitis. He'll be treated with azithromycin and Percocet prescription will be given for pain control.        Dione Booze, MD 02/12/12 [phone]

## 2012-02-12 NOTE — ED Notes (Signed)
Pt c/o left ear pain. Worse pain when he lays down.

## 2014-07-03 ENCOUNTER — Ambulatory Visit (INDEPENDENT_AMBULATORY_CARE_PROVIDER_SITE_OTHER): Payer: BC Managed Care – PPO | Admitting: Family Medicine

## 2014-07-03 ENCOUNTER — Encounter: Payer: Self-pay | Admitting: Family Medicine

## 2014-07-03 VITALS — BP 120/82 | Ht 71.0 in | Wt 214.5 lb

## 2014-07-03 DIAGNOSIS — Z125 Encounter for screening for malignant neoplasm of prostate: Secondary | ICD-10-CM

## 2014-07-03 DIAGNOSIS — Z0000 Encounter for general adult medical examination without abnormal findings: Secondary | ICD-10-CM

## 2014-07-03 DIAGNOSIS — Z79899 Other long term (current) drug therapy: Secondary | ICD-10-CM

## 2014-07-03 NOTE — Progress Notes (Signed)
   Subjective:    Patient ID: Bryce Hamilton, male    DOB: 12-30-1965, 48 y.o.   MRN: [id number]  HPI The patient comes in today for a wellness visit.    A review of their health history was completed.  A review of medications was also completed.  Any needed refills; n/a  Eating habits: good  Falls/  MVA accidents in past few months: none  Regular exercise: just on his job  Specialist pt sees on regular basis: none  Preventative health issues were discussed.   Additional concerns: none  Hx of mild elevation in the past of cholesterol, tries to eat right  Diabetes in the family,   Review of Systems  Constitutional: Negative for fever, activity change and appetite change.  HENT: Negative for congestion and rhinorrhea.   Eyes: Negative for discharge.  Respiratory: Negative for cough and wheezing.   Cardiovascular: Negative for chest pain.  Gastrointestinal: Negative for vomiting, abdominal pain and blood in stool.  Genitourinary: Negative for frequency and difficulty urinating.  Musculoskeletal: Negative for neck pain.  Skin: Negative for rash.  Allergic/Immunologic: Negative for environmental allergies and food allergies.  Neurological: Negative for weakness and headaches.  Psychiatric/Behavioral: Negative for agitation.  All other systems reviewed and are negative.      Objective:   Physical Exam  Constitutional: He appears well-developed and well-nourished.  HENT:  Head: Normocephalic and atraumatic.  Right Ear: External ear normal.  Left Ear: External ear normal.  Nose: Nose normal.  Mouth/Throat: Oropharynx is clear and moist.  Eyes: EOM are normal. Pupils are equal, round, and reactive to light.  Neck: Normal range of motion. Neck supple. No thyromegaly present.  Cardiovascular: Normal rate, regular rhythm and normal heart sounds.   No murmur heard. Pulmonary/Chest: Effort normal and breath sounds normal. No respiratory distress. He has no wheezes.    Abdominal: Soft. Bowel sounds are normal. He exhibits no distension and no mass. There is no tenderness.  Genitourinary: Penis normal.  Prostate exam within normal limits  Musculoskeletal: Normal range of motion. He exhibits no edema.  Lymphadenopathy:    He has no cervical adenopathy.  Neurological: He is alert. He exhibits normal muscle tone.  Skin: Skin is warm and dry. No erythema.  Psychiatric: He has a normal mood and affect. His behavior is normal. Judgment normal.          Assessment & Plan:  Impression 1 wellness exam #2 history of borderline hyperlipidemia. Plan appropriate blood work. Further recommendations based on results Diet exercise discussed in encourage. Colonoscopy also encourage. We'll do patient GI sheet. WSL

## 2014-07-05 ENCOUNTER — Encounter: Payer: Self-pay | Admitting: *Deleted

## 2014-07-05 LAB — BASIC METABOLIC PANEL
BUN: 10 mg/dL (ref 6–23)
CO2: 28 meq/L (ref 19–32)
Calcium: 9.3 mg/dL (ref 8.4–10.5)
Chloride: 104 mEq/L (ref 96–112)
Creat: 0.91 mg/dL (ref 0.50–1.35)
GLUCOSE: 105 mg/dL — AB (ref 70–99)
POTASSIUM: 4.4 meq/L (ref 3.5–5.3)
SODIUM: 138 meq/L (ref 135–145)

## 2014-07-05 LAB — HEPATIC FUNCTION PANEL
ALT: 22 U/L (ref 0–53)
AST: 21 U/L (ref 0–37)
Albumin: 4.2 g/dL (ref 3.5–5.2)
Alkaline Phosphatase: 44 U/L (ref 39–117)
BILIRUBIN DIRECT: 0.2 mg/dL (ref 0.0–0.3)
BILIRUBIN INDIRECT: 0.7 mg/dL (ref 0.2–1.2)
BILIRUBIN TOTAL: 0.9 mg/dL (ref 0.2–1.2)
Total Protein: 7 g/dL (ref 6.0–8.3)

## 2014-07-05 LAB — LIPID PANEL
CHOL/HDL RATIO: 3.6 ratio
CHOLESTEROL: 205 mg/dL — AB (ref 0–200)
HDL: 57 mg/dL (ref >39)
LDL Cholesterol: 135 mg/dL — ABNORMAL HIGH (ref 0–99)
Triglycerides: 65 mg/dL (ref <150)
VLDL: 13 mg/dL (ref 0–40)

## 2014-07-07 LAB — PSA: PSA: 1.14 ng/mL (ref <=4.00)

## 2014-07-14 ENCOUNTER — Encounter: Payer: Self-pay | Admitting: Family Medicine

## 2015-09-15 ENCOUNTER — Ambulatory Visit (INDEPENDENT_AMBULATORY_CARE_PROVIDER_SITE_OTHER): Payer: BC Managed Care – PPO | Admitting: Family Medicine

## 2015-09-15 ENCOUNTER — Encounter: Payer: Self-pay | Admitting: Family Medicine

## 2015-09-15 VITALS — BP 128/84 | Ht 71.5 in | Wt 223.0 lb

## 2015-09-15 DIAGNOSIS — Z79899 Other long term (current) drug therapy: Secondary | ICD-10-CM

## 2015-09-15 DIAGNOSIS — Z23 Encounter for immunization: Secondary | ICD-10-CM | POA: Diagnosis not present

## 2015-09-15 DIAGNOSIS — Z0000 Encounter for general adult medical examination without abnormal findings: Secondary | ICD-10-CM

## 2015-09-15 DIAGNOSIS — Z125 Encounter for screening for malignant neoplasm of prostate: Secondary | ICD-10-CM

## 2015-09-15 DIAGNOSIS — Z13220 Encounter for screening for lipoid disorders: Secondary | ICD-10-CM | POA: Diagnosis not present

## 2015-09-15 NOTE — Progress Notes (Signed)
   Subjective:    Patient ID: Bryce Hamilton, male    DOB: 07-15-1966, 49 y.o.   MRN: [id number]  HPI The patient comes in today for a wellness visit.  Work [employer] and [employer] part time  A review of their health history was completed.  A review of medications was also completed.  Any needed refills: no meds currently  Eating habits: not good   Falls/  MVA accidents in past few months: none  Regular exercise: no   Specialist pt sees on regular basis: none  Preventative health issues were discussed.   Additional concerns:  None   Colonoscopy- has not had one yet  exercise is so so  Job is so so   Flu shot already given  No new medical problems    Review of Systems  Constitutional: Negative for fever, activity change and appetite change.  HENT: Negative for congestion and rhinorrhea.   Eyes: Negative for discharge.  Respiratory: Negative for cough and wheezing.   Cardiovascular: Negative for chest pain.  Gastrointestinal: Negative for vomiting, abdominal pain and blood in stool.  Genitourinary: Negative for frequency and difficulty urinating.  Musculoskeletal: Negative for neck pain.  Skin: Negative for rash.  Allergic/Immunologic: Negative for environmental allergies and food allergies.  Neurological: Negative for weakness and headaches.  Psychiatric/Behavioral: Negative for agitation.  All other systems reviewed and are negative.      Objective:   Physical Exam  Constitutional: He appears well-developed and well-nourished.  HENT:  Head: Normocephalic and atraumatic.  Right Ear: External ear normal.  Left Ear: External ear normal.  Nose: Nose normal.  Mouth/Throat: Oropharynx is clear and moist.  Eyes: EOM are normal. Pupils are equal, round, and reactive to light.  Neck: Normal range of motion. Neck supple. No thyromegaly present.  Cardiovascular: Normal rate, regular rhythm and normal heart sounds.   No murmur heard. Pulmonary/Chest: Effort  normal and breath sounds normal. No respiratory distress. He has no wheezes.  Abdominal: Soft. Bowel sounds are normal. He exhibits no distension and no mass. There is no tenderness.  Genitourinary: Penis normal.  Musculoskeletal: Normal range of motion. He exhibits no edema.  Lymphadenopathy:    He has no cervical adenopathy.  Neurological: He is alert. He exhibits normal muscle tone.  Skin: Skin is warm and dry. No erythema.  Psychiatric: He has a normal mood and affect. His behavior is normal. Judgment normal.  Vitals reviewed.         Assessment & Plan:  Impression 1 wellness exam plan diet discussed exercise discussed colonoscopy recommended

## 2015-09-16 LAB — HEPATIC FUNCTION PANEL
ALBUMIN: 4.7 g/dL (ref 3.5–5.5)
ALK PHOS: 63 IU/L (ref 39–117)
ALT: 37 IU/L (ref 0–44)
AST: 28 IU/L (ref 0–40)
BILIRUBIN TOTAL: 0.7 mg/dL (ref 0.0–1.2)
BILIRUBIN, DIRECT: 0.16 mg/dL (ref 0.00–0.40)
Total Protein: 7.9 g/dL (ref 6.0–8.5)

## 2015-09-16 LAB — LIPID PANEL
CHOLESTEROL TOTAL: 257 mg/dL — AB (ref 100–199)
Chol/HDL Ratio: 4.2 ratio units (ref 0.0–5.0)
HDL: 61 mg/dL (ref >39)
LDL CALC: 181 mg/dL — AB (ref 0–99)
TRIGLYCERIDES: 74 mg/dL (ref 0–149)
VLDL Cholesterol Cal: 15 mg/dL (ref 5–40)

## 2015-09-16 LAB — BASIC METABOLIC PANEL
BUN / CREAT RATIO: 15 (ref 9–20)
BUN: 13 mg/dL (ref 6–24)
CALCIUM: 9.6 mg/dL (ref 8.7–10.2)
CHLORIDE: 101 mmol/L (ref 97–106)
CO2: 23 mmol/L (ref 18–29)
Creatinine, Ser: 0.89 mg/dL (ref 0.76–1.27)
GFR, EST AFRICAN AMERICAN: 115 mL/min/{1.73_m2} (ref >59)
GFR, EST NON AFRICAN AMERICAN: 100 mL/min/{1.73_m2} (ref >59)
Glucose: 99 mg/dL (ref 65–99)
POTASSIUM: 4.2 mmol/L (ref 3.5–5.2)
SODIUM: 140 mmol/L (ref 136–144)

## 2015-09-16 LAB — PSA: Prostate Specific Ag, Serum: 1.1 ng/mL (ref 0.0–4.0)

## 2015-09-25 ENCOUNTER — Encounter: Payer: Self-pay | Admitting: Family Medicine

## 2015-09-25 ENCOUNTER — Ambulatory Visit (INDEPENDENT_AMBULATORY_CARE_PROVIDER_SITE_OTHER): Payer: BC Managed Care – PPO | Admitting: Family Medicine

## 2015-09-25 VITALS — BP 140/82 | Ht 71.5 in | Wt 225.2 lb

## 2015-09-25 DIAGNOSIS — Z13220 Encounter for screening for lipoid disorders: Secondary | ICD-10-CM

## 2015-09-25 DIAGNOSIS — E785 Hyperlipidemia, unspecified: Secondary | ICD-10-CM | POA: Diagnosis not present

## 2015-09-25 NOTE — Patient Instructions (Signed)
Results for orders placed or performed in visit on 09/15/15  Lipid panel  Result Value Ref Range   Cholesterol, Total 257 (H) 100 - 199 mg/dL   Triglycerides 74 0 - 149 mg/dL   HDL 61 >39 mg/dL   VLDL Cholesterol Cal 15 5 - 40 mg/dL   LDL Calculated 181 (H) 0 - 99 mg/dL   Chol/HDL Ratio 4.2 0.0 - 5.0 ratio units  Hepatic function panel  Result Value Ref Range   Total Protein 7.9 6.0 - 8.5 g/dL   Albumin 4.7 3.5 - 5.5 g/dL   Bilirubin Total 0.7 0.0 - 1.2 mg/dL   Bilirubin, Direct 0.16 0.00 - 0.40 mg/dL   Alkaline Phosphatase 63 39 - 117 IU/L   AST 28 0 - 40 IU/L   ALT 37 0 - 44 IU/L  Basic metabolic panel  Result Value Ref Range   Glucose 99 65 - 99 mg/dL   BUN 13 6 - 24 mg/dL   Creatinine, Ser 0.89 0.76 - 1.27 mg/dL   GFR calc non Af Amer 100 >59 mL/min/1.73   GFR calc Af Amer 115 >59 mL/min/1.73   BUN/Creatinine Ratio 15 9 - 20   Sodium 140 136 - 144 mmol/L   Potassium 4.2 3.5 - 5.2 mmol/L   Chloride 101 97 - 106 mmol/L   CO2 23 18 - 29 mmol/L   Calcium 9.6 8.7 - 10.2 mg/dL  PSA  Result Value Ref Range   Prostate Specific Ag, Serum 1.1 0.0 - 4.0 ng/mL   Fat and Cholesterol Restricted Diet High levels of fat and cholesterol in your blood may lead to various health problems, such as diseases of the heart, blood vessels, gallbladder, liver, and pancreas. Fats are concentrated sources of energy that come in various forms. Certain types of fat, including saturated fat, may be harmful in excess. Cholesterol is a substance needed by your body in small amounts. Your body makes all the cholesterol it needs. Excess cholesterol comes from the food you eat. When you have high levels of cholesterol and saturated fat in your blood, health problems can develop because the excess fat and cholesterol will gather along the walls of your blood vessels, causing them to narrow. Choosing the right foods will help you control your intake of fat and cholesterol. This will help keep the levels of these  substances in your blood within normal limits and reduce your risk of disease. WHAT IS MY PLAN? Your health care provider recommends that you:  Get no more than __________ % of the total calories in your daily diet from fat.  Limit your intake of saturated fat to less than ______% of your total calories each day.  Limit the amount of cholesterol in your diet to less than _________mg per day. WHAT TYPES OF FAT SHOULD I CHOOSE?  Choose healthy fats more often. Choose monounsaturated and polyunsaturated fats, such as olive and canola oil, flaxseeds, walnuts, almonds, and seeds.  Eat more omega-3 fats. Good choices include salmon, mackerel, sardines, tuna, flaxseed oil, and ground flaxseeds. Aim to eat fish at least two times a week.  Limit saturated fats. Saturated fats are primarily found in animal products, such as meats, butter, and cream. Plant sources of saturated fats include palm oil, palm kernel oil, and coconut oil.  Avoid foods with partially hydrogenated oils in them. These contain trans fats. Examples of foods that contain trans fats are stick margarine, some tub margarines, cookies, crackers, and other baked goods. WHAT GENERAL GUIDELINES DO  I NEED TO FOLLOW? These guidelines for healthy eating will help you control your intake of fat and cholesterol:  Check food labels carefully to identify foods with trans fats or high amounts of saturated fat.  Fill one half of your plate with vegetables and green salads.  Fill one fourth of your plate with whole grains. Look for the word "whole" as the first word in the ingredient list.  Fill one fourth of your plate with lean protein foods.  Limit fruit to two servings a day. Choose fruit instead of juice.  Eat more foods that contain soluble fiber. Examples of foods that contain this type of fiber are apples, broccoli, carrots, beans, peas, and barley. Aim to get 20-30 g of fiber per day.  Eat more home-cooked food and less  restaurant, buffet, and fast food.  Limit or avoid alcohol.  Limit foods high in starch and sugar.  Limit fried foods.  Cook foods using methods other than frying. Baking, boiling, grilling, and broiling are all great options.  Lose weight if you are overweight. Losing just 5-10% of your initial body weight can help your overall health and prevent diseases such as diabetes and heart disease. WHAT FOODS CAN I EAT? Grains Whole grains, such as whole wheat or whole grain breads, crackers, cereals, and pasta. Unsweetened oatmeal, bulgur, barley, quinoa, or brown rice. Corn or whole wheat flour tortillas. Vegetables Fresh or frozen vegetables (raw, steamed, roasted, or grilled). Green salads. Fruits All fresh, canned (in natural juice), or frozen fruits. Meat and Other Protein Products Ground beef (85% or leaner), grass-fed beef, or beef trimmed of fat. Skinless chicken or [place]. Ground chicken or [place]. Pork trimmed of fat. All fish and seafood. Eggs. Dried beans, peas, or lentils. Unsalted nuts or seeds. Unsalted canned or dry beans. Dairy Low-fat dairy products, such as skim or 1% milk, 2% or reduced-fat cheeses, low-fat ricotta or cottage cheese, or plain low-fat yogurt. Fats and Oils Tub margarines without trans fats. Light or reduced-fat mayonnaise and salad dressings. Avocado. Olive, canola, sesame, or safflower oils. Natural peanut or almond butter (choose ones without added sugar and oil). The items listed above may not be a complete list of recommended foods or beverages. Contact your dietitian for more options. WHAT FOODS ARE NOT RECOMMENDED? Grains White bread. White pasta. White rice. Cornbread. Bagels, pastries, and croissants. Crackers that contain trans fat. Vegetables White potatoes. Corn. Creamed or fried vegetables. Vegetables in a cheese sauce. Fruits Dried fruits. Canned fruit in light or heavy syrup. Fruit juice. Meat and Other Protein Products Fatty cuts of meat.  Ribs, chicken wings, bacon, sausage, bologna, salami, chitterlings, fatback, hot dogs, bratwurst, and packaged luncheon meats. Liver and organ meats. Dairy Whole or 2% milk, cream, half-and-half, and cream cheese. Whole milk cheeses. Whole-fat or sweetened yogurt. Full-fat cheeses. Nondairy creamers and whipped toppings. Processed cheese, cheese spreads, or cheese curds. Sweets and Desserts Corn syrup, sugars, honey, and molasses. Candy. Jam and jelly. Syrup. Sweetened cereals. Cookies, pies, cakes, donuts, muffins, and ice cream. Fats and Oils Butter, stick margarine, lard, shortening, ghee, or bacon fat. Coconut, palm kernel, or palm oils. Beverages Alcohol. Sweetened drinks (such as sodas, lemonade, and fruit drinks or punches). The items listed above may not be a complete list of foods and beverages to avoid. Contact your dietitian for more information.   This information is not intended to replace advice given to you by your health care provider. Make sure you discuss any questions you have with your  health care provider.   Document Released: 10/31/2005 Document Revised: 11/21/2014 Document Reviewed: 01/29/2014 Elsevier Interactive Patient Education [employer].

## 2015-09-25 NOTE — Progress Notes (Signed)
   Subjective:    Patient ID: Bryce Hamilton, male    DOB: 1966-01-21, 49 y.o.   MRN: [id number]  HPI  Patient in today to discuss recent labs results from 09/15/2015.  Results for orders placed or performed in visit on 09/15/15  Lipid panel  Result Value Ref Range   Cholesterol, Total 257 (H) 100 - 199 mg/dL   Triglycerides 74 0 - 149 mg/dL   HDL 61 >39 mg/dL   VLDL Cholesterol Cal 15 5 - 40 mg/dL   LDL Calculated 181 (H) 0 - 99 mg/dL   Chol/HDL Ratio 4.2 0.0 - 5.0 ratio units  Hepatic function panel  Result Value Ref Range   Total Protein 7.9 6.0 - 8.5 g/dL   Albumin 4.7 3.5 - 5.5 g/dL   Bilirubin Total 0.7 0.0 - 1.2 mg/dL   Bilirubin, Direct 0.16 0.00 - 0.40 mg/dL   Alkaline Phosphatase 63 39 - 117 IU/L   AST 28 0 - 40 IU/L   ALT 37 0 - 44 IU/L  Basic metabolic panel  Result Value Ref Range   Glucose 99 65 - 99 mg/dL   BUN 13 6 - 24 mg/dL   Creatinine, Ser 0.89 0.76 - 1.27 mg/dL   GFR calc non Af Amer 100 >59 mL/min/1.73   GFR calc Af Amer 115 >59 mL/min/1.73   BUN/Creatinine Ratio 15 9 - 20   Sodium 140 136 - 144 mmol/L   Potassium 4.2 3.5 - 5.2 mmol/L   Chloride 101 97 - 106 mmol/L   CO2 23 18 - 29 mmol/L   Calcium 9.6 8.7 - 10.2 mg/dL  PSA  Result Value Ref Range   Prostate Specific Ag, Serum 1.1 0.0 - 4.0 ng/mL   Pt has shifted his meal in a major way Patient states no other concerns this visit.  Review of Systems No headache no chest pain no back pain no abdominal pain ROS otherwise negative    Objective:   Physical Exam  Alert vital stable HEENT normal lungs clear heart regular in rhythm.      Assessment & Plan:  Impression hyperlipidemia long discussion held patient does have some risk factors admits to poor dietary habits plan educational information given. Long-term rationale for tight control discussed patient will recheck blood work in several months if considerably improved will not need medication if similar levels or only borderline improvement  will need medicine WSL

## 2015-09-27 DIAGNOSIS — E785 Hyperlipidemia, unspecified: Secondary | ICD-10-CM | POA: Insufficient documentation

## 2016-01-19 ENCOUNTER — Telehealth: Payer: Self-pay

## 2016-01-19 NOTE — Telephone Encounter (Signed)
Pt of Dr Lance Sell called to be triaged for his first colonoscopy. Please call [phone] (He is aware that triages are done between 130pm-430pm)

## 2016-01-19 NOTE — Telephone Encounter (Signed)
Gastroenterology Pre-Procedure Review  Request Date: 01/19/2016 Requesting Physician: Dr. Wolfgang Phoenix  PATIENT REVIEW QUESTIONS: The patient responded to the following health history questions as indicated:    1. Diabetes Melitis: no 2. Joint replacements in the past 12 months: no 3. Major health problems in the past 3 months: no 4. Has an artificial valve or MVP: no 5. Has a defibrillator: no 6. Has been advised in past to take antibiotics in advance of a procedure like teeth cleaning: no 7. Family history of colon cancer: no  8. Alcohol Use: no 9. History of sleep apnea: no     MEDICATIONS & ALLERGIES:    Patient reports the following regarding taking any blood thinners:   Plavix? no Aspirin? no Coumadin? no  Patient confirms/reports the following medications:  No current outpatient prescriptions on file.   No current facility-administered medications for this visit.    Patient confirms/reports the following allergies:  No Known Allergies  No orders of the defined types were placed in this encounter.    AUTHORIZATION INFORMATION Primary Insurance:  ID #:   Group #:  Pre-Cert / Auth required:  Pre-Cert / Auth #:   Secondary Insurance:   ID #:  Group #:  Pre-Cert / Auth required:  Pre-Cert / Auth #:   SCHEDULE INFORMATION: Procedure has been scheduled as follows:  Date: 02/10/2016             Time:  1:45 PM Location: [facility] Short Stay  This Gastroenterology Pre-Precedure Review Form is being routed to the following provider(s): Barney Drain, MD

## 2016-01-19 NOTE — Telephone Encounter (Signed)
PREPOPIK-DRINK WATER TO KEEP URINE LIGHT YELLOW. FULL LIQUIDS WITH BREAKFAST.  Full Liquid Diet A high-calorie, high-protein supplement should be used to meet your nutritional requirements when the full liquid diet is continued for more than 2 or 3 days. If this diet is to be used for an extended period of time (more than 7 days), a multivitamin should be considered.  Breads and Starches  Allowed: None are allowed except crackers pureed (made into a thick, smooth soup) in soup.   Avoid: Any others.    Potatoes/Pasta/Rice  Allowed: ANY ITEM AS A SOUP OR SMALL PLATE OF MASHED POTATOES OR SCRAMBLED EGGS.       Vegetables  Allowed: Strained tomato or vegetable juice. Vegetables pureed in soup.   Avoid: Any others.    Fruit  Allowed: Any strained fruit juices and fruit drinks. Include 1 serving of citrus or vitamin C-enriched fruit juice daily.   Avoid: Any others.  Meat and Meat Substitutes  Allowed: Egg  Avoid: Any meat, fish, or fowl. All cheese.  Milk  Allowed: SOY Milk beverages, including milk shakes and instant breakfast mixes. Smooth yogurt.   Avoid: Any others. Avoid dairy products if not tolerated.    Soups and Combination Foods  Allowed: Broth, strained cream soups. Strained, broth-based soups.   Avoid: Any others.    Desserts and Sweets  Allowed: flavored gelatin, tapioca, ice cream, sherbet, smooth pudding, junket, fruit ices, frozen ice pops, pudding pops, frozen fudge pops, chocolate syrup. Sugar, honey, jelly, syrup.   Avoid: Any others.  Fats and Oils  Allowed: Margarine, butter, cream, sour cream, oils.   Avoid: Any others.  Beverages  Allowed: All.   Avoid: None.  Condiments  Allowed: Iodized salt, pepper, spices, flavorings. Cocoa powder.   Avoid: Any others.    SAMPLE MEAL PLAN Breakfast   cup orange juice.   1 OR 2 EGGS  1 cup milk.   1 cup beverage (coffee or tea).   Cream or sugar, if desired.    Midmorning  Snack  2 SCRAMBLED OR HARD BOILED EGG   Lunch  1 cup cream soup.    cup fruit juice.   1 cup milk.    cup custard.   1 cup beverage (coffee or tea).   Cream or sugar, if desired.    Midafternoon Snack  1 cup milk shake.  Dinner  1 cup cream soup.    cup fruit juice.   1 cup MILK    cup pudding.   1 cup beverage (coffee or tea).   Cream or sugar, if desired.  Evening Snack  1 cup supplement.  To increase calories, add sugar, cream, butter, or margarine if possible. Nutritional supplements will also increase the total calories.

## 2016-01-20 ENCOUNTER — Other Ambulatory Visit: Payer: Self-pay

## 2016-01-20 DIAGNOSIS — Z1211 Encounter for screening for malignant neoplasm of colon: Secondary | ICD-10-CM

## 2016-01-20 MED ORDER — SOD PICOSULFATE-MAG OX-CIT ACD 10-3.5-12 MG-GM-GM PO PACK: 1.0000 | PACK | ORAL | Status: DC

## 2016-01-20 NOTE — Addendum Note (Signed)
Addended by: Everardo All on: 01/20/2016 10:54 AM   Modules accepted: Orders

## 2016-01-20 NOTE — Telephone Encounter (Signed)
Rx sent to the pharmacy and instructions mailed to pt.  

## 2016-01-28 ENCOUNTER — Telehealth: Payer: Self-pay

## 2016-01-28 NOTE — Telephone Encounter (Signed)
I called [employer] @ [phone] and spoke to [place] who said a PA is not required for the screening colonoscopy as out patient at the hospital.

## 2016-02-10 ENCOUNTER — Ambulatory Visit (HOSPITAL_COMMUNITY)
Admission: RE | Admit: 2016-02-10 | Discharge: 2016-02-10 | Disposition: A | Discharge status: Home / Self Care | Payer: BC Managed Care – PPO | Source: Ambulatory Visit | Attending: Gastroenterology | Admitting: Gastroenterology

## 2016-02-10 ENCOUNTER — Encounter (HOSPITAL_COMMUNITY): Payer: Self-pay | Admitting: *Deleted

## 2016-02-10 ENCOUNTER — Encounter (HOSPITAL_COMMUNITY)
Admission: RE | Disposition: A | Discharge status: Home / Self Care | Payer: Self-pay | Source: Ambulatory Visit | Attending: Gastroenterology

## 2016-02-10 DIAGNOSIS — K648 Other hemorrhoids: Secondary | ICD-10-CM | POA: Insufficient documentation

## 2016-02-10 DIAGNOSIS — E785 Hyperlipidemia, unspecified: Secondary | ICD-10-CM | POA: Insufficient documentation

## 2016-02-10 DIAGNOSIS — K644 Residual hemorrhoidal skin tags: Secondary | ICD-10-CM | POA: Diagnosis not present

## 2016-02-10 DIAGNOSIS — Z1211 Encounter for screening for malignant neoplasm of colon: Secondary | ICD-10-CM | POA: Insufficient documentation

## 2016-02-10 HISTORY — PX: COLONOSCOPY: SHX5424

## 2016-02-10 SURGERY — COLONOSCOPY
Anesthesia: Moderate Sedation

## 2016-02-10 MED ORDER — MIDAZOLAM HCL 5 MG/5ML IJ SOLN
INTRAMUSCULAR | Status: AC
  Filled 2016-02-10: qty 10

## 2016-02-10 MED ORDER — SODIUM CHLORIDE 0.9 % IV SOLN
INTRAVENOUS | Status: DC
  Administered 2016-02-10: 1000 mL via INTRAVENOUS

## 2016-02-10 MED ORDER — SIMETHICONE 40 MG/0.6ML PO SUSP
ORAL | Status: DC | PRN
  Administered 2016-02-10: 14:00:00

## 2016-02-10 MED ORDER — MIDAZOLAM HCL 5 MG/5ML IJ SOLN
INTRAMUSCULAR | Status: DC | PRN
  Administered 2016-02-10: 1 mg via INTRAVENOUS
  Administered 2016-02-10 (×2): 2 mg via INTRAVENOUS

## 2016-02-10 MED ORDER — MEPERIDINE HCL 100 MG/ML IJ SOLN
INTRAMUSCULAR | Status: AC
  Filled 2016-02-10: qty 2

## 2016-02-10 MED ORDER — MEPERIDINE HCL 100 MG/ML IJ SOLN
INTRAMUSCULAR | Status: DC | PRN
  Administered 2016-02-10 (×2): 25 mg via INTRAVENOUS

## 2016-02-10 NOTE — H&P (Signed)
  Primary Care Physician:  Mickie Hillier, MD Primary Gastroenterologist:  Dr. Oneida Alar  Pre-Procedure History & Physical: HPI:  Bryce Hamilton is a 50 y.o. male here for [place].  Past Medical History  Diagnosis Date  . Hyperlipidemia     Past Surgical History  Procedure Laterality Date  . Cholecystectomy    . Appendectomy      has had scar tissue removed in that area since appendectomy    Prior to Admission medications   Medication Sig Start Date End Date Taking? Authorizing Provider  Sod Picosulfate-Mag Ox-Cit Acd 10-3.5-12 MG-GM-GM PACK Take 1 Container by mouth as directed. 01/20/16  Yes Danie Binder, MD    Allergies as of 01/20/2016  . (No Known Allergies)    Family History  Problem Relation Age of Onset  . Hypertension Mother   . Diabetes Father   . Leukemia Father     Social History   Social History  . Marital Status: Married    Spouse Name: N/A  . Number of Children: N/A  . Years of Education: N/A   Occupational History  . Not on file.   Social History Main Topics  . Smoking status: Never Smoker   . Smokeless tobacco: Not on file  . Alcohol Use: No  . Drug Use: No  . Sexual Activity: Not on file   Other Topics Concern  . Not on file   Social History Narrative    Review of Systems: See HPI, otherwise negative ROS   Physical Exam: BP 130/91 mmHg  Pulse 77  Temp(Src) 97.9 F (36.6 C) (Oral)  Resp 18  Ht 6\' 1"  (1.854 m)  Wt 220 lb (99.791 kg)  BMI 29.03 kg/m2  SpO2 100% General:   Alert,  pleasant and cooperative in NAD Head:  Normocephalic and atraumatic. Neck:  Supple; Lungs:  Clear throughout to auscultation.    Heart:  Regular rate and rhythm. Abdomen:  Soft, nontender and nondistended. Normal bowel sounds, without guarding, and without rebound.   Neurologic:  Alert and  oriented x4;  grossly normal neurologically.  Impression/Plan:     SCREENING  Plan:  1. TCS TODAY

## 2016-02-10 NOTE — Op Note (Signed)
[facility] Patient Name: Bryce Hamilton Procedure Date: 02/10/2016 1:59 PM MRN: [id number] Date of Birth: 05/08/1966 Attending MD: Barney Drain , MD CSN: [id number] Age: 50 Admit Type: Outpatient Procedure:                Colonoscopy Indications:              Screening for colorectal malignant neoplasm Providers:                Barney Drain, MD, Rosina Lowenstein, RN, Bonnetta Barry,                            Technician Referring MD:             Rosemary Holms M.D. , MD (Referring MD) Medicines:                Meperidine 50 mg IV, Midazolam 5 mg IV Complications:            No immediate complications. Estimated Blood Loss:     Estimated blood loss: none. Procedure:                Pre-Anesthesia Assessment:                           - Prior to the procedure, a History and Physical                            was performed, and patient medications and                            allergies were reviewed. The patient's tolerance of                            previous anesthesia was also reviewed. The risks                            and benefits of the procedure and the sedation                            options and risks were discussed with the patient.                            All questions were answered, and informed consent                            was obtained. Prior Anticoagulants: The patient has                            taken no previous anticoagulant or antiplatelet                            agents. ASA Grade Assessment: I - A normal, healthy                            patient. After reviewing the risks and benefits,  the patient was deemed in satisfactory condition to                            undergo the procedure.                           After obtaining informed consent, the colonoscope                            was passed under direct vision. Throughout the                            procedure, the patient's blood pressure, pulse, and                       oxygen saturations were monitored continuously. The                            [id number] scope was introduced through                            the anus and advanced to the the cecum, identified                            by appendiceal orifice and ileocecal valve. The                            ileocecal valve, appendiceal orifice, and rectum                            were photographed. The colonoscopy was performed                            without difficulty. The patient tolerated the                            procedure well. The quality of the bowel                            preparation was excellent. Scope In: 2:12:01 PM Scope Out: 2:26:12 PM Scope Withdrawal Time: 0 hours 10 minutes 42 seconds  Total Procedure Duration: 0 hours 14 minutes 11 seconds  Findings:      The digital rectal exam findings include non-thrombosed external       hemorrhoids.      The colon (entire examined portion) appeared normal.      Non-bleeding internal hemorrhoids were found. The hemorrhoids were       medium-sized. Impression:               - Non-thrombosed external hemorrhoids found on                            digital rectal exam.                           - The entire examined colon is normal.                           -  Non-bleeding internal hemorrhoids.                           - No specimens collected. Moderate Sedation:      Moderate (conscious) sedation was administered by the endoscopy nurse       and supervised by the endoscopist. The following parameters were       monitored: oxygen saturation, heart rate, blood pressure, and response       to care. Recommendation:           - Patient has a contact number available for                            emergencies. The signs and symptoms of potential                            delayed complications were discussed with the                            patient. Return to normal activities tomorrow.                             Written discharge instructions were provided to the                            patient.                           - High fiber diet.                           - Continue present medications.                           - Await pathology results.                           - Repeat colonoscopy in 10 years for surveillance. Procedure Code(s):        --- Professional ---                           [phone], Colonoscopy, flexible; diagnostic, including                            collection of specimen(s) by brushing or washing,                            when performed (separate procedure) Diagnosis Code(s):        --- Professional ---                           Z12.11, Encounter for screening for malignant                            neoplasm of colon  K64.4, Residual hemorrhoidal skin tags                           K64.8, Other hemorrhoids CPT copyright 2016 [employer]. All rights reserved. The codes documented in this report are preliminary and upon coder review may  be revised to meet current compliance requirements. Barney Drain, MD Barney Drain, MD 02/10/2016 3:43:55 PM This report has been signed electronically. Number of Addenda: 0

## 2016-02-10 NOTE — Progress Notes (Signed)
Please excuse Bryce Hamilton from work on Wednesday February 10, 2016.  He may return to work on Friday February 12, 2016.

## 2016-02-10 NOTE — Discharge Instructions (Signed)
You have internal hemorrhoids. YOU DID NOT HAVE ANY POLYPS.   FOLLOW A HIGH FIBER DIET. AVOID ITEMS THAT CAUSE BLOATING. SEE INFO BELOW.  USE PREPARATION H FOUR TIMES  A DAY AS NEEDED TO RELIEVE RECTAL PAIN/PRESSURE/BLEEDING.  Next colonoscopy in 10 years.  Colonoscopy Care After Read the instructions outlined below and refer to this sheet in the next week. These discharge instructions provide you with general information on caring for yourself after you leave the hospital. While your treatment has been planned according to the most current medical practices available, unavoidable complications occasionally occur. If you have any problems or questions after discharge, call DR. Mishawn Hemann, [phone].  ACTIVITY  You may resume your regular activity, but move at a slower pace for the next 24 hours.   Take frequent rest periods for the next 24 hours.   Walking will help get rid of the air and reduce the bloated feeling in your belly (abdomen).   No driving for 24 hours (because of the medicine (anesthesia) used during the test).   You may shower.   Do not sign any important legal documents or operate any machinery for 24 hours (because of the anesthesia used during the test).    NUTRITION  Drink plenty of fluids.   You may resume your normal diet as instructed by your doctor.   Begin with a light meal and progress to your normal diet. Heavy or fried foods are harder to digest and may make you feel sick to your stomach (nauseated).   Avoid alcoholic beverages for 24 hours or as instructed.    MEDICATIONS  You may resume your normal medications.   WHAT YOU CAN EXPECT TODAY  Some feelings of bloating in the abdomen.   Passage of more gas than usual.   Spotting of blood in your stool or on the toilet paper  .  IF YOU HAD POLYPS REMOVED DURING THE COLONOSCOPY:  Eat a soft diet IF YOU HAVE NAUSEA, BLOATING, ABDOMINAL PAIN, OR VOMITING.    FINDING OUT THE RESULTS OF YOUR  TEST Not all test results are available during your visit. DR. Oneida Alar WILL CALL YOU WITHIN 7 DAYS OF YOUR PROCEDUE WITH YOUR RESULTS. Do not assume everything is normal if you have not heard from DR. Lillieanna Tuohy IN ONE WEEK, CALL HER OFFICE AT [phone].  SEEK IMMEDIATE MEDICAL ATTENTION AND CALL THE OFFICE: [phone] IF:  You have more than a spotting of blood in your stool.   Your belly is swollen (abdominal distention).   You are nauseated or vomiting.   You have a temperature over 101F.   You have abdominal pain or discomfort that is severe or gets worse throughout the day.  High-Fiber Diet A high-fiber diet changes your normal diet to include more whole grains, legumes, fruits, and vegetables. Changes in the diet involve replacing refined carbohydrates with unrefined foods. The calorie level of the diet is essentially unchanged. The Dietary Reference Intake (recommended amount) for adult males is 38 grams per day. For adult females, it is 25 grams per day. Pregnant and lactating women should consume 28 grams of fiber per day. Fiber is the intact part of a plant that is not broken down during digestion. Functional fiber is fiber that has been isolated from the plant to provide a beneficial effect in the body. PURPOSE  Increase stool bulk.   Ease and regulate bowel movements.   Lower cholesterol.  REDUCE RISK OF COLON CANCER  INDICATIONS THAT YOU NEED MORE FIBER  Constipation and hemorrhoids.   Uncomplicated diverticulosis (intestine condition) and irritable bowel syndrome.   Weight management.   As a protective measure against hardening of the arteries (atherosclerosis), diabetes, and cancer.   GUIDELINES FOR INCREASING FIBER IN THE DIET  Start adding fiber to the diet slowly. A gradual increase of about 5 more grams (2 slices of whole-wheat bread, 2 servings of most fruits or vegetables, or 1 bowl of high-fiber cereal) per day is best. Too rapid an increase in fiber may  result in constipation, flatulence, and bloating.   Drink enough water and fluids to keep your urine clear or pale yellow. Water, juice, or caffeine-free drinks are recommended. Not drinking enough fluid may cause constipation.   Eat a variety of high-fiber foods rather than one type of fiber.   Try to increase your intake of fiber through using high-fiber foods rather than fiber pills or supplements that contain small amounts of fiber.   The goal is to change the types of food eaten. Do not supplement your present diet with high-fiber foods, but replace foods in your present diet.   INCLUDE A VARIETY OF FIBER SOURCES  Replace refined and processed grains with whole grains, canned fruits with fresh fruits, and incorporate other fiber sources. White rice, white breads, and most bakery goods contain little or no fiber.   Brown whole-grain rice, buckwheat oats, and many fruits and vegetables are all good sources of fiber. These include: broccoli, Brussels sprouts, cabbage, cauliflower, beets, sweet potatoes, white potatoes (skin on), carrots, tomatoes, eggplant, squash, berries, fresh fruits, and dried fruits.   Cereals appear to be the richest source of fiber. Cereal fiber is found in whole grains and bran. Bran is the fiber-rich outer coat of cereal grain, which is largely removed in refining. In whole-grain cereals, the bran remains. In breakfast cereals, the largest amount of fiber is found in those with "bran" in their names. The fiber content is sometimes indicated on the label.   You may need to include additional fruits and vegetables each day.   In baking, for 1 cup white flour, you may use the following substitutions:   1 cup whole-wheat flour minus 2 tablespoons.   1/2 cup white flour plus 1/2 cup whole-wheat flour.   Hemorrhoids Hemorrhoids are dilated (enlarged) veins around the rectum. Sometimes clots will form in the veins. This makes them swollen and painful. These are called  thrombosed hemorrhoids. Causes of hemorrhoids include:  Constipation.   Straining to have a bowel movement.   HEAVY LIFTING  HOME CARE INSTRUCTIONS  Eat a well balanced diet and drink 6 to 8 glasses of water every day to avoid constipation. You may also use a bulk laxative.   Avoid straining to have bowel movements.   Keep anal area dry and clean.   Do not use a donut shaped pillow or sit on the toilet for long periods. This increases blood pooling and pain.   Move your bowels when your body has the urge; this will require less straining and will decrease pain and pressure.

## 2016-02-12 ENCOUNTER — Encounter (HOSPITAL_COMMUNITY): Payer: Self-pay | Admitting: Gastroenterology

## 2016-10-21 ENCOUNTER — Ambulatory Visit (INDEPENDENT_AMBULATORY_CARE_PROVIDER_SITE_OTHER): Payer: BC Managed Care – PPO | Admitting: Family Medicine

## 2016-10-21 ENCOUNTER — Encounter: Payer: Self-pay | Admitting: Family Medicine

## 2016-10-21 VITALS — BP 142/98 | Temp 98.3°F | Ht 73.0 in | Wt 227.0 lb

## 2016-10-21 DIAGNOSIS — J019 Acute sinusitis, unspecified: Secondary | ICD-10-CM

## 2016-10-21 DIAGNOSIS — L03213 Periorbital cellulitis: Secondary | ICD-10-CM

## 2016-10-21 DIAGNOSIS — R030 Elevated blood-pressure reading, without diagnosis of hypertension: Secondary | ICD-10-CM | POA: Diagnosis not present

## 2016-10-21 MED ORDER — CEPHALEXIN 500 MG PO CAPS: 500.0000 mg | ORAL_CAPSULE | Freq: Four times a day (QID) | ORAL | 0 refills | Status: DC

## 2016-10-21 NOTE — Patient Instructions (Addendum)
Please check your blood pressure to 3 times over the course of the next few weeks if it maintains elevated above 130/80 I recommend that you follow-up with Dr. Richardson Landry    DASH Eating Plan DASH stands for "Dietary Approaches to Stop Hypertension." The DASH eating plan is a healthy eating plan that has been shown to reduce high blood pressure (hypertension). Additional health benefits may include reducing the risk of type 2 diabetes mellitus, heart disease, and stroke. The DASH eating plan may also help with weight loss. What do I need to know about the DASH eating plan? For the DASH eating plan, you will follow these general guidelines:  Choose foods with less than 150 milligrams of sodium per serving (as listed on the food label).  Use salt-free seasonings or herbs instead of table salt or sea salt.  Check with your health care provider or pharmacist before using salt substitutes.  Eat lower-sodium products. These are often labeled as "low-sodium" or "no salt added."  Eat fresh foods. Avoid eating a lot of canned foods.  Eat more vegetables, fruits, and low-fat dairy products.  Choose whole grains. Look for the word "whole" as the first word in the ingredient list.  Choose fish and skinless chicken or [place] more often than red meat. Limit fish, poultry, and meat to 6 oz (170 g) each day.  Limit sweets, desserts, sugars, and sugary drinks.  Choose heart-healthy fats.  Eat more home-cooked food and less restaurant, buffet, and fast food.  Limit fried foods.  Do not fry foods. Cook foods using methods such as baking, boiling, grilling, and broiling instead.  When eating at a restaurant, ask that your food be prepared with less salt, or no salt if possible. What foods can I eat? Seek help from a dietitian for individual calorie needs. Grains  Whole grain or whole wheat bread. Brown rice. Whole grain or whole wheat pasta. Quinoa, bulgur, and whole grain cereals. Low-sodium cereals.  Corn or whole wheat flour tortillas. Whole grain cornbread. Whole grain crackers. Low-sodium crackers. Vegetables  Fresh or frozen vegetables (raw, steamed, roasted, or grilled). Low-sodium or reduced-sodium tomato and vegetable juices. Low-sodium or reduced-sodium tomato sauce and paste. Low-sodium or reduced-sodium canned vegetables. Fruits  All fresh, canned (in natural juice), or frozen fruits. Meat and Other Protein Products  Ground beef (85% or leaner), grass-fed beef, or beef trimmed of fat. Skinless chicken or [place]. Ground chicken or [place]. Pork trimmed of fat. All fish and seafood. Eggs. Dried beans, peas, or lentils. Unsalted nuts and seeds. Unsalted canned beans. Dairy  Low-fat dairy products, such as skim or 1% milk, 2% or reduced-fat cheeses, low-fat ricotta or cottage cheese, or plain low-fat yogurt. Low-sodium or reduced-sodium cheeses. Fats and Oils  Tub margarines without trans fats. Light or reduced-fat mayonnaise and salad dressings (reduced sodium). Avocado. Safflower, olive, or canola oils. Natural peanut or almond butter. Other  Unsalted popcorn and pretzels. The items listed above may not be a complete list of recommended foods or beverages. Contact your dietitian for more options.  What foods are not recommended? Grains  White bread. White pasta. White rice. Refined cornbread. Bagels and croissants. Crackers that contain trans fat. Vegetables  Creamed or fried vegetables. Vegetables in a cheese sauce. Regular canned vegetables. Regular canned tomato sauce and paste. Regular tomato and vegetable juices. Fruits  Canned fruit in light or heavy syrup. Fruit juice. Meat and Other Protein Products  Fatty cuts of meat. Ribs, chicken wings, bacon, sausage, bologna, salami, chitterlings,  fatback, hot dogs, bratwurst, and packaged luncheon meats. Salted nuts and seeds. Canned beans with salt. Dairy  Whole or 2% milk, cream, half-and-half, and cream cheese. Whole-fat or  sweetened yogurt. Full-fat cheeses or blue cheese. Nondairy creamers and whipped toppings. Processed cheese, cheese spreads, or cheese curds. Condiments  Onion and garlic salt, seasoned salt, table salt, and sea salt. Canned and packaged gravies. Worcestershire sauce. Tartar sauce. Barbecue sauce. Teriyaki sauce. Soy sauce, including reduced sodium. Steak sauce. Fish sauce. Oyster sauce. Cocktail sauce. Horseradish. Ketchup and mustard. Meat flavorings and tenderizers. Bouillon cubes. Hot sauce. Tabasco sauce. Marinades. Taco seasonings. Relishes. Fats and Oils  Butter, stick margarine, lard, shortening, ghee, and bacon fat. Coconut, palm kernel, or palm oils. Regular salad dressings. Other  Pickles and olives. Salted popcorn and pretzels. The items listed above may not be a complete list of foods and beverages to avoid. Contact your dietitian for more information.  Where can I find more information? National Heart, [employer], and [employer] Institute: travelstabloid.com This information is not intended to replace advice given to you by your health care provider. Make sure you discuss any questions you have with your health care provider. Document Released: 10/20/2011 Document Revised: 04/07/2016 Document Reviewed: 09/04/2013 Elsevier Interactive Patient Education  2017 [employer].

## 2016-10-21 NOTE — Progress Notes (Signed)
   Subjective:    Patient ID: Bryce Hamilton, male    DOB: Jun 19, 1966, 50 y.o.   MRN: [id number]  Sinusitis  This is a new problem. Episode onset: 5 days ago. Associated symptoms include headaches and a sore throat. (Right side of face swollen) Treatments tried: hot salt water.   Some sore throat some headache in addition this the right eyelid is swollen somewhat tender maxillary sinus tenderness as well denies wheezing or difficulty breathing  PMH benign Review of Systems  HENT: Positive for sore throat.   Neurological: Positive for headaches.   No vomiting no wheezing denies neck pain    Objective:   Physical Exam Upper eyelid on the right side is red slightly tender left eyelid appears normal. Throat is normal. Lungs clear heart regular   Blood pressure is elevated patient was advised to maintain a low salt diet exercise checked his blood pressures frequently over the course of next few weeks if they're not looking better he needs a follow-up to see Dr. Richardson Landry    Assessment & Plan:  Viral syndrome Conjunctivitis Possible sinus infection Possible cellulitis of the upper eyelid Antibiotic prescribed warning signs discussed follow-up if ongoing troubles or worse.

## 2017-06-26 ENCOUNTER — Encounter: Payer: Self-pay | Admitting: Family Medicine

## 2017-06-26 ENCOUNTER — Ambulatory Visit (INDEPENDENT_AMBULATORY_CARE_PROVIDER_SITE_OTHER): Payer: BC Managed Care – PPO | Admitting: Family Medicine

## 2017-06-26 VITALS — BP 130/70 | Ht 71.0 in | Wt 220.4 lb

## 2017-06-26 DIAGNOSIS — Z0000 Encounter for general adult medical examination without abnormal findings: Secondary | ICD-10-CM

## 2017-06-26 NOTE — Progress Notes (Signed)
   Subjective:    Patient ID: Bryce Hamilton, male    DOB: 12/18/1965, 51 y.o.   MRN: [id number]  HPI The patient comes in today for a wellness visit.    A review of their health history was completed.  A review of medications was also completed.  Any needed refills; N/A  Eating habits:  Patient states eating habits are good.  Falls/  MVA accidents in past few months:  None   Regular exercise: Patient states lift weights sometimes.  Specialist pt sees on regular basis: None  Preventative health issues were discussed.   Additional concerns: Patient states no other concerns this visit.   Staying busy with work     Review of Systems  Constitutional: Negative for activity change, appetite change and fever.  HENT: Negative for congestion and rhinorrhea.   Eyes: Negative for discharge.  Respiratory: Negative for cough and wheezing.   Cardiovascular: Negative for chest pain.  Gastrointestinal: Negative for abdominal pain, blood in stool and vomiting.  Genitourinary: Negative for difficulty urinating and frequency.  Musculoskeletal: Negative for neck pain.  Skin: Negative for rash.  Allergic/Immunologic: Negative for environmental allergies and food allergies.  Neurological: Negative for weakness and headaches.  Psychiatric/Behavioral: Negative for agitation.  All other systems reviewed and are negative.      Objective:   Physical Exam  Constitutional: He appears well-developed and well-nourished.  HENT:  Head: Normocephalic and atraumatic.  Right Ear: External ear normal.  Left Ear: External ear normal.  Nose: Nose normal.  Mouth/Throat: Oropharynx is clear and moist.  Eyes: Pupils are equal, round, and reactive to light. EOM are normal.  Neck: Normal range of motion. Neck supple. No thyromegaly present.  Cardiovascular: Normal rate, regular rhythm and normal heart sounds.   No murmur heard. Pulmonary/Chest: Effort normal and breath sounds normal. No respiratory  distress. He has no wheezes.  Abdominal: Soft. Bowel sounds are normal. He exhibits no distension and no mass. There is no tenderness.  Genitourinary: Penis normal.  Musculoskeletal: Normal range of motion. He exhibits no edema.  Lymphadenopathy:    He has no cervical adenopathy.  Neurological: He is alert. He exhibits normal muscle tone.  Skin: Skin is warm and dry. No erythema.  Psychiatric: He has a normal mood and affect. His behavior is normal. Judgment normal.  Vitals reviewed.         Assessment & Plan:  Impression well adult exam. Died discuss exercise discussed , up-to-date on colonpy.#2 hyperlipidemia status uncertain He is #3 erectile dysfunctionpatient would prefer tld off on medicine this time plan appropriate blood work. Diet discussed exercise discussed. Further recommendations based results

## 2017-06-28 LAB — BASIC METABOLIC PANEL
BUN / CREAT RATIO: 11 (ref 9–20)
BUN: 10 mg/dL (ref 6–24)
CALCIUM: 9.4 mg/dL (ref 8.7–10.2)
CO2: 25 mmol/L (ref 20–29)
Chloride: 105 mmol/L (ref 96–106)
Creatinine, Ser: 0.9 mg/dL (ref 0.76–1.27)
GFR calc Af Amer: 113 mL/min/{1.73_m2} (ref >59)
GFR, EST NON AFRICAN AMERICAN: 98 mL/min/{1.73_m2} (ref >59)
Glucose: 103 mg/dL — ABNORMAL HIGH (ref 65–99)
Potassium: 4.3 mmol/L (ref 3.5–5.2)
Sodium: 140 mmol/L (ref 134–144)

## 2017-06-28 LAB — LIPID PANEL
CHOL/HDL RATIO: 3.5 ratio (ref 0.0–5.0)
CHOLESTEROL TOTAL: 197 mg/dL (ref 100–199)
HDL: 56 mg/dL (ref >39)
LDL CALC: 123 mg/dL — AB (ref 0–99)
TRIGLYCERIDES: 91 mg/dL (ref 0–149)
VLDL Cholesterol Cal: 18 mg/dL (ref 5–40)

## 2017-06-28 LAB — HEPATIC FUNCTION PANEL
ALBUMIN: 4.5 g/dL (ref 3.5–5.5)
ALT: 24 IU/L (ref 0–44)
AST: 23 IU/L (ref 0–40)
Alkaline Phosphatase: 57 IU/L (ref 39–117)
BILIRUBIN, DIRECT: 0.18 mg/dL (ref 0.00–0.40)
Bilirubin Total: 0.9 mg/dL (ref 0.0–1.2)
Total Protein: 7.5 g/dL (ref 6.0–8.5)

## 2017-06-28 LAB — PSA: Prostate Specific Ag, Serum: 1.2 ng/mL (ref 0.0–4.0)

## 2017-07-02 ENCOUNTER — Encounter: Payer: Self-pay | Admitting: Family Medicine

## 2018-01-21 ENCOUNTER — Inpatient Hospital Stay (HOSPITAL_COMMUNITY)
Admission: EM | Admit: 2018-01-21 | Discharge: 2018-01-22 | DRG: 194 | Payer: BC Managed Care – PPO | Attending: Family Medicine | Admitting: Family Medicine

## 2018-01-21 ENCOUNTER — Emergency Department (HOSPITAL_COMMUNITY): Payer: BC Managed Care – PPO

## 2018-01-21 ENCOUNTER — Encounter (HOSPITAL_COMMUNITY): Payer: Self-pay | Admitting: Emergency Medicine

## 2018-01-21 ENCOUNTER — Other Ambulatory Visit: Payer: Self-pay

## 2018-01-21 DIAGNOSIS — D72829 Elevated white blood cell count, unspecified: Secondary | ICD-10-CM | POA: Diagnosis not present

## 2018-01-21 DIAGNOSIS — E785 Hyperlipidemia, unspecified: Secondary | ICD-10-CM

## 2018-01-21 DIAGNOSIS — Z806 Family history of leukemia: Secondary | ICD-10-CM | POA: Diagnosis not present

## 2018-01-21 DIAGNOSIS — D649 Anemia, unspecified: Secondary | ICD-10-CM

## 2018-01-21 DIAGNOSIS — R000 Tachycardia, unspecified: Secondary | ICD-10-CM | POA: Diagnosis not present

## 2018-01-21 DIAGNOSIS — D61818 Other pancytopenia: Secondary | ICD-10-CM | POA: Diagnosis not present

## 2018-01-21 DIAGNOSIS — D696 Thrombocytopenia, unspecified: Secondary | ICD-10-CM | POA: Diagnosis not present

## 2018-01-21 DIAGNOSIS — C9500 Acute leukemia of unspecified cell type not having achieved remission: Secondary | ICD-10-CM | POA: Diagnosis present

## 2018-01-21 DIAGNOSIS — J189 Pneumonia, unspecified organism: Secondary | ICD-10-CM | POA: Diagnosis not present

## 2018-01-21 DIAGNOSIS — D589 Hereditary hemolytic anemia, unspecified: Secondary | ICD-10-CM | POA: Diagnosis present

## 2018-01-21 DIAGNOSIS — I517 Cardiomegaly: Secondary | ICD-10-CM | POA: Diagnosis not present

## 2018-01-21 DIAGNOSIS — D72825 Bandemia: Secondary | ICD-10-CM | POA: Diagnosis not present

## 2018-01-21 DIAGNOSIS — R531 Weakness: Secondary | ICD-10-CM

## 2018-01-21 DIAGNOSIS — R51 Headache: Secondary | ICD-10-CM | POA: Diagnosis present

## 2018-01-21 DIAGNOSIS — N400 Enlarged prostate without lower urinary tract symptoms: Secondary | ICD-10-CM | POA: Diagnosis present

## 2018-01-21 DIAGNOSIS — D599 Acquired hemolytic anemia, unspecified: Secondary | ICD-10-CM | POA: Diagnosis present

## 2018-01-21 LAB — COMPREHENSIVE METABOLIC PANEL
ALT: 46 U/L (ref 17–63)
AST: 40 U/L (ref 15–41)
Albumin: 2.8 g/dL — ABNORMAL LOW (ref 3.5–5.0)
Alkaline Phosphatase: 63 U/L (ref 38–126)
Anion gap: 12 (ref 5–15)
BILIRUBIN TOTAL: 1.6 mg/dL — AB (ref 0.3–1.2)
BUN: 17 mg/dL (ref 6–20)
CHLORIDE: 102 mmol/L (ref 101–111)
CO2: 22 mmol/L (ref 22–32)
CREATININE: 0.96 mg/dL (ref 0.61–1.24)
Calcium: 8.3 mg/dL — ABNORMAL LOW (ref 8.9–10.3)
Glucose, Bld: 123 mg/dL — ABNORMAL HIGH (ref 65–99)
POTASSIUM: 3.5 mmol/L (ref 3.5–5.1)
Sodium: 136 mmol/L (ref 135–145)
TOTAL PROTEIN: 7.5 g/dL (ref 6.5–8.1)

## 2018-01-21 LAB — BASIC METABOLIC PANEL
ANION GAP: 14 (ref 5–15)
BUN: 18 mg/dL (ref 6–20)
CALCIUM: 9.1 mg/dL (ref 8.9–10.3)
CO2: 22 mmol/L (ref 22–32)
Chloride: 102 mmol/L (ref 101–111)
Creatinine, Ser: 1.06 mg/dL (ref 0.61–1.24)
GFR calc non Af Amer: >60 mL/min (ref >60)
Glucose, Bld: 144 mg/dL — ABNORMAL HIGH (ref 65–99)
Potassium: 3.5 mmol/L (ref 3.5–5.1)
Sodium: 138 mmol/L (ref 135–145)

## 2018-01-21 LAB — PREPARE RBC (CROSSMATCH)

## 2018-01-21 LAB — URINALYSIS, ROUTINE W REFLEX MICROSCOPIC
BILIRUBIN URINE: NEGATIVE
Glucose, UA: NEGATIVE mg/dL
Hgb urine dipstick: NEGATIVE
Ketones, ur: NEGATIVE mg/dL
LEUKOCYTES UA: NEGATIVE
NITRITE: NEGATIVE
Protein, ur: NEGATIVE mg/dL
SPECIFIC GRAVITY, URINE: 1.026 (ref 1.005–1.030)
pH: 6 (ref 5.0–8.0)

## 2018-01-21 LAB — CBG MONITORING, ED: GLUCOSE-CAPILLARY: 128 mg/dL — AB (ref 65–99)

## 2018-01-21 LAB — TSH: TSH: 2.772 u[IU]/mL (ref 0.350–4.500)

## 2018-01-21 LAB — HEMOGLOBIN A1C
Hgb A1c MFr Bld: 7 % — ABNORMAL HIGH (ref 4.8–5.6)
Mean Plasma Glucose: 154.2 mg/dL

## 2018-01-21 LAB — RETICULOCYTES
RBC.: 2.23 MIL/uL — AB (ref 4.22–5.81)
RETIC CT PCT: 0.6 % (ref 0.4–3.1)
Retic Count, Absolute: 13.4 10*3/uL — ABNORMAL LOW (ref 19.0–186.0)

## 2018-01-21 LAB — CBC
HCT: 20.8 % — ABNORMAL LOW (ref 39.0–52.0)
HEMOGLOBIN: 6.8 g/dL — AB (ref 13.0–17.0)
MCH: 31.3 pg (ref 26.0–34.0)
MCHC: 32.7 g/dL (ref 30.0–36.0)
MCV: 95.9 fL (ref 78.0–100.0)
Platelets: 24 10*3/uL — CL (ref 150–400)
RBC: 2.17 MIL/uL — AB (ref 4.22–5.81)
RDW: 15.9 % — ABNORMAL HIGH (ref 11.5–15.5)
WBC: 30.7 10*3/uL — AB (ref 4.0–10.5)

## 2018-01-21 LAB — SAVE SMEAR

## 2018-01-21 LAB — LACTIC ACID, PLASMA: Lactic Acid, Venous: 1.1 mmol/L (ref 0.5–1.9)

## 2018-01-21 LAB — LACTATE DEHYDROGENASE: LDH: 1463 U/L — ABNORMAL HIGH (ref 98–192)

## 2018-01-21 LAB — ABO/RH: ABO/RH(D): O POS

## 2018-01-21 LAB — MONONUCLEOSIS SCREEN: MONO SCREEN: NEGATIVE

## 2018-01-21 MED ORDER — ZOLPIDEM TARTRATE 5 MG PO TABS: 5.0000 mg | ORAL_TABLET | Freq: Every evening | ORAL | Status: DC | PRN

## 2018-01-21 MED ORDER — ACETAMINOPHEN 500 MG PO TABS
1000.0000 mg | ORAL_TABLET | Freq: Once | ORAL | Status: AC
  Administered 2018-01-21: 1000 mg via ORAL
  Filled 2018-01-21: qty 2

## 2018-01-21 MED ORDER — SODIUM CHLORIDE 0.9 % IV SOLN
1.0000 g | INTRAVENOUS | Status: DC
  Administered 2018-01-22: 1 g via INTRAVENOUS
  Filled 2018-01-21: qty 10
  Filled 2018-01-21: qty 1

## 2018-01-21 MED ORDER — SODIUM CHLORIDE 0.9 % IV SOLN
Freq: Once | INTRAVENOUS | Status: AC
  Administered 2018-01-21: 18:00:00 via INTRAVENOUS

## 2018-01-21 MED ORDER — SODIUM CHLORIDE 0.9 % IV SOLN
1.0000 g | Freq: Once | INTRAVENOUS | Status: AC
  Administered 2018-01-21: 1 g via INTRAVENOUS
  Filled 2018-01-21: qty 10

## 2018-01-21 MED ORDER — AZITHROMYCIN 250 MG PO TABS
500.0000 mg | ORAL_TABLET | ORAL | Status: DC
  Administered 2018-01-22: 500 mg via ORAL
  Filled 2018-01-21: qty 2

## 2018-01-21 MED ORDER — SODIUM CHLORIDE 0.9 % IV SOLN
1000.0000 mL | INTRAVENOUS | Status: DC
  Administered 2018-01-21 – 2018-01-22 (×2): 1000 mL via INTRAVENOUS

## 2018-01-21 MED ORDER — SODIUM CHLORIDE 0.9 % IV SOLN
1000.0000 mL | INTRAVENOUS | Status: DC
  Administered 2018-01-21: 1000 mL via INTRAVENOUS

## 2018-01-21 MED ORDER — ACETAMINOPHEN 325 MG PO TABS
650.0000 mg | ORAL_TABLET | Freq: Four times a day (QID) | ORAL | Status: DC | PRN
  Administered 2018-01-21 – 2018-01-22 (×4): 650 mg via ORAL
  Filled 2018-01-21 (×4): qty 2

## 2018-01-21 MED ORDER — SODIUM CHLORIDE 0.9 % IV SOLN
500.0000 mg | Freq: Once | INTRAVENOUS | Status: AC
  Administered 2018-01-21: 500 mg via INTRAVENOUS
  Filled 2018-01-21: qty 500

## 2018-01-21 MED ORDER — IOPAMIDOL (ISOVUE-300) INJECTION 61%
100.0000 mL | Freq: Once | INTRAVENOUS | Status: AC | PRN
  Administered 2018-01-21: 100 mL via INTRAVENOUS

## 2018-01-21 MED ORDER — SODIUM CHLORIDE 0.9 % IV BOLUS (SEPSIS)
1000.0000 mL | Freq: Once | INTRAVENOUS | Status: AC
  Administered 2018-01-21: 1000 mL via INTRAVENOUS

## 2018-01-21 NOTE — ED Notes (Signed)
Pt transported to CT ?

## 2018-01-21 NOTE — ED Notes (Signed)
Pt reports similar episode of extreme weakness and fatigue approx 2 weeks ago and resolved. This episode began Friday and states he could hardly drive home from work. Reports he has been in bed since Friday. Reports SOB with any movement. Sclera noted to be yellow

## 2018-01-21 NOTE — ED Notes (Signed)
Date and time results received: 01/21/18 1016   Test: blood specimen  Critical Value: hemoglobin 6.8, platelets 24  Name of Provider Notified: Bryant,Hobson  Orders Received? Or Actions Taken?: No new orders given.

## 2018-01-21 NOTE — H&P (Signed)
History and Physical  Bryce Hamilton PRF:[id number] DOB: 07/24/1966 DOA: 01/21/2018  Referring physician: Meryl Crutch PCP: Mikey Kirschner, MD   Chief Complaint: Weakness  HPI: Bryce Hamilton is a 52 y.o. male nonsmoker with PMH of hyperlipidemia, had a reported normal colonscopy in 2017 with the exception of hemorrhoids, presented to ED with complaints of 2 weeks of what he describes as extreme fatigue that started to get better for a short time but then returned and became worse.  He has not been able to get out of bed for the past couple of days.  He describes shortness of breath with any type of activity.  He denies chest pain.  He denies fever and chills.  He has been coughing. He describes his symptoms as having no energy, pain in his back and shoulders.  He thought that he had an upper respiratory type infection he took Tylenol and NyQuil.  He was finally able to get to a point where he could return to his work, but later during the week he began to have symptoms again.  This time it seemed to be worse as he felt like he wanted to only be in bed and not get up except for going to the bathroom.  He states that his eating is not what it usually is.  He continues to have back aches, as well as shoulder aching.  He also complains of some generalized headache.  There is been no unusual sore throat, no cough, no diarrhea, no constipation, no unusual rash, no unusual abdominal pain.  There is been no weakness of any one side of the upper or lower extremities.  The weakness has been a generalized type weakness.  The patient denies any recent insect bites or tick bites.  He has not had any recent trips out of the country.  He is unsure of any fevers, but states he does not recall any significant chills.  Prior to the 2-week onset, he has not had this problem in the past. There is been no recent changes in his medications, and he does not have any ongoing medical issues.  The patient says that he donates  blood regularly but has not done so in the last 9 months.  His father died of leukemia.   ED Course: The patient was seen in ED and noted to be weak and had abnormal labs consisting of a white blood cell count of 30.7.  Hemoglobin was 6.8.  He had a platelet count of 24.  He had a market left shift noted on peripheral blood smear.  He had a chest x-ray that suggested pneumonia and a subsequent CT chest that suggested pneumonia.  His mono screen was negative.  Blood cultures were taken from the patient and he was started on IV antibiotics.  He is being admitted to the hospital for further treatment.   Review of Systems: All systems reviewed and apart from history of presenting illness, are negative.  Past Medical History:  Diagnosis Date  . Hyperlipidemia    Past Surgical History:  Procedure Laterality Date  . APPENDECTOMY     has had scar tissue removed in that area since appendectomy  . CHOLECYSTECTOMY    . COLONOSCOPY N/A 02/10/2016   Procedure: COLONOSCOPY;  Surgeon: Danie Binder, MD;  Location: [facility];  Service: Endoscopy;  Laterality: N/A;  1:45 PM   Social History:  reports that  has never smoked. he has never used smokeless tobacco. He reports  that he does not drink alcohol or use drugs.  No Known Allergies  Family History  Problem Relation Age of Onset  . Hypertension Mother   . Diabetes Father   . Leukemia Father     Prior to Admission medications   Not on File   Physical Exam: Vitals:   01/21/18 1000 01/21/18 1030 01/21/18 1100 01/21/18 1130  BP: 133/74 129/77 135/84 125/80  Pulse: (!) 112 (!) 110 (!) 111 (!) 105  Resp: 15 14 18  (!) 24  Temp:      TempSrc:      SpO2: 95% 95% (!) 89% 99%  Weight:      Height:         General exam: Moderately built and nourished patient, lying comfortably supine on the gurney in no obvious distress.  Head, eyes and ENT: Nontraumatic and normocephalic. Pupils equally reacting to light and accommodation. Oral mucosa  moist. Scleral icterus noted bilateral.    Neck: Supple. No JVD, carotid bruit or thyromegaly.  Lymphatics: No lymphadenopathy.  Respiratory system: Abnormal lung sounds with rhonchi heard in both bases.. No increased work of breathing.  Cardiovascular system: S1 and S2 heard, tachycardic. No JVD, murmurs, gallops, clicks or pedal edema.  Gastrointestinal system: Abdomen is nondistended, soft and nontender. Normal bowel sounds heard. No organomegaly or masses appreciated.  Central nervous system: Alert and oriented. No focal neurological deficits.  Extremities: Symmetric 5 x 5 power. Peripheral pulses symmetrically felt.   Skin: No rashes or acute findings.  Musculoskeletal system: Negative exam.  Psychiatry: Pleasant and cooperative.  Labs on Admission:  Basic Metabolic Panel: Recent Labs  Lab 01/21/18 0944  NA 138  K 3.5  CL 102  CO2 22  GLUCOSE 144*  BUN 18  CREATININE 1.06  CALCIUM 9.1   Liver Function Tests: No results for input(s): AST, ALT, ALKPHOS, BILITOT, PROT, ALBUMIN in the last 168 hours. No results for input(s): LIPASE, AMYLASE in the last 168 hours. No results for input(s): AMMONIA in the last 168 hours. CBC: Recent Labs  Lab 01/21/18 0944  WBC 30.7*  HGB 6.8*  HCT 20.8*  MCV 95.9  PLT 24*   Cardiac Enzymes: No results for input(s): CKTOTAL, CKMB, CKMBINDEX, TROPONINI in the last 168 hours.  BNP (last 3 results) No results for input(s): PROBNP in the last 8760 hours. CBG: Recent Labs  Lab 01/21/18 0948  GLUCAP 128*    Radiological Exams on Admission: Dg Chest 2 View  Result Date: 01/21/2018 CLINICAL DATA:  Generalized weakness. EXAM: CHEST - 2 VIEW COMPARISON:  None. FINDINGS: The heart size and mediastinal contours are within normal limits. Mildly increased interstitial markings. No focal consolidation, pleural effusion, or pneumothorax. No acute osseous abnormality. IMPRESSION: 1. Mildly increased interstitial markings which can be  seen with edema or interstitial pneumonia. Electronically Signed   By: Titus Dubin M.D.   On: 01/21/2018 10:44   Ct Chest W Contrast  Result Date: 01/21/2018 CLINICAL DATA:  Pancytopenia, weakness, fatigue EXAM: CT CHEST, ABDOMEN, AND PELVIS WITH CONTRAST TECHNIQUE: Multidetector CT imaging of the chest, abdomen and pelvis was performed following the standard protocol during bolus administration of intravenous contrast. CONTRAST:  17mL ISOVUE-300 IOPAMIDOL (ISOVUE-300) INJECTION 61% COMPARISON:  None. FINDINGS: CT CHEST FINDINGS Cardiovascular: Heart is normal size. Aorta is normal caliber. Mediastinum/Nodes: Small scattered borderline size mediastinal lymph nodes. AP window lymph node has a short axis diameter of 9 mm. Right paratracheal lymph node has a short axis diameter of 7 mm. No axillary or  hilar adenopathy. Slight wall thickening in the distal esophagus. Small hiatal hernia. Trachea unremarkable. Lungs/Pleura: Calcified granuloma in the right upper lobe. There are hazy ground-glass airspace opacities in the lungs bilaterally. Given the normal heart size, favor this being infectious or inflammatory. Small right pleural effusion. Trace left effusion. Dependent atelectasis in the lower lobes. Musculoskeletal: No acute bony abnormality. CT ABDOMEN PELVIS FINDINGS Hepatobiliary: Prior cholecystectomy.  No focal hepatic abnormality. Pancreas: No focal abnormality or ductal dilatation. Spleen: No focal abnormality.  Normal size. Adrenals/Urinary Tract: Small cysts in the kidneys bilaterally. Punctate nonobstructing stone in the lower pole of the right kidney. Parenchymal calcifications in the upper pole of the left kidney. No hydronephrosis. No adrenal mass. Urinary bladder is unremarkable. Stomach/Bowel: Postoperative changes within small bowel loops in the lower pelvis. No evidence of bowel obstruction. Vascular/Lymphatic: Scattered aortic calcifications. No evidence of aneurysm or adenopathy.  Reproductive: Mildly prominent prostate. Other: No free fluid or free air. Musculoskeletal: No acute bony abnormality. IMPRESSION: Hazy ground-glass opacities within the lungs bilaterally with small bilateral effusions. Given the normal heart size, I favor this is infectious or inflammatory pneumonitis. Scattered borderline sized mediastinal lymph nodes. No hilar or axillary adenopathy. Mild wall thickening in the distal esophagus which could be related to esophagitis. Small hiatal hernia. Small bilateral renal cysts. Prior cholecystectomy. Right lower pole nephrolithiasis. Prostate enlargement. Electronically Signed   By: Rolm Baptise M.D.   On: 01/21/2018 12:05   Ct Abdomen Pelvis W Contrast  Result Date: 01/21/2018 CLINICAL DATA:  Pancytopenia, weakness, fatigue EXAM: CT CHEST, ABDOMEN, AND PELVIS WITH CONTRAST TECHNIQUE: Multidetector CT imaging of the chest, abdomen and pelvis was performed following the standard protocol during bolus administration of intravenous contrast. CONTRAST:  172mL ISOVUE-300 IOPAMIDOL (ISOVUE-300) INJECTION 61% COMPARISON:  None. FINDINGS: CT CHEST FINDINGS Cardiovascular: Heart is normal size. Aorta is normal caliber. Mediastinum/Nodes: Small scattered borderline size mediastinal lymph nodes. AP window lymph node has a short axis diameter of 9 mm. Right paratracheal lymph node has a short axis diameter of 7 mm. No axillary or hilar adenopathy. Slight wall thickening in the distal esophagus. Small hiatal hernia. Trachea unremarkable. Lungs/Pleura: Calcified granuloma in the right upper lobe. There are hazy ground-glass airspace opacities in the lungs bilaterally. Given the normal heart size, favor this being infectious or inflammatory. Small right pleural effusion. Trace left effusion. Dependent atelectasis in the lower lobes. Musculoskeletal: No acute bony abnormality. CT ABDOMEN PELVIS FINDINGS Hepatobiliary: Prior cholecystectomy.  No focal hepatic abnormality. Pancreas: No  focal abnormality or ductal dilatation. Spleen: No focal abnormality.  Normal size. Adrenals/Urinary Tract: Small cysts in the kidneys bilaterally. Punctate nonobstructing stone in the lower pole of the right kidney. Parenchymal calcifications in the upper pole of the left kidney. No hydronephrosis. No adrenal mass. Urinary bladder is unremarkable. Stomach/Bowel: Postoperative changes within small bowel loops in the lower pelvis. No evidence of bowel obstruction. Vascular/Lymphatic: Scattered aortic calcifications. No evidence of aneurysm or adenopathy. Reproductive: Mildly prominent prostate. Other: No free fluid or free air. Musculoskeletal: No acute bony abnormality. IMPRESSION: Hazy ground-glass opacities within the lungs bilaterally with small bilateral effusions. Given the normal heart size, I favor this is infectious or inflammatory pneumonitis. Scattered borderline sized mediastinal lymph nodes. No hilar or axillary adenopathy. Mild wall thickening in the distal esophagus which could be related to esophagitis. Small hiatal hernia. Small bilateral renal cysts. Prior cholecystectomy. Right lower pole nephrolithiasis. Prostate enlargement. Electronically Signed   By: Rolm Baptise M.D.   On: 01/21/2018 12:05  EKG: Independently reviewed.  Sinus tachycardia  Assessment/Plan Principal Problem:   CAP (community acquired pneumonia) Active Problems:   Hyperlipidemia   Thrombocytopenia (HCC)   Anemia   Leukocytosis   Generalized weakness  1. Community-acquired pneumonia-admitted to the hospital for supportive treatment with IV antibiotics and supportive care.  Oxygen as needed.  Follow blood cultures. 2. Severe thrombocytopenia-she is not actively bleeding at this time but will need to monitor very closely.  Holding all heparin products.  Hematology consult requested. 3. Leukocytosis-the patient is noted to have a marked left shift and this could be secondary to the acute infection however there is  some concerned about leukemia.  We did request a hematology oncology consult. 4. Generalized weakness-likely secondary to the anemia and acute illness.  Will transfuse 2 units of packed red blood cells. 5. Anemia of unknown cause-transfusing 2 units of packed red blood cells.  Obtain hematology consult as noted above.  Hemoccult stools.  Check an anemia panel. 6. Sinus tachycardia-secondary to the acute illness and treating with supportive therapy, continuing IV fluids and treatment for pneumonia.  DVT Prophylaxis: SCDs Code Status: Full   Family Communication: bedside  Disposition Plan: TBD   Time spent: 67 mins  Irwin Brakeman, MD [facility] Pager [phone]  If 7PM-7AM, please contact night-coverage [email or web address] Password [facility] 01/21/2018, 12:32 PM

## 2018-01-21 NOTE — ED Provider Notes (Signed)
[facility] EMERGENCY DEPARTMENT Provider Note   CSN: [id number] Arrival date & time: 01/21/18  4765     History   Chief Complaint Chief Complaint  Patient presents with  . Fatigue    HPI Bryce Hamilton is a 52 y.o. male.  Patient is a 52 year old male who presents to the emergency department with a complaint of fatigue.  The patient states that this problem started approximately 2 weeks ago.  He describes his symptoms as having no energy, pain in his back and shoulders.  He thought that he had an upper respiratory type infection he took Tylenol and NyQuil.  He was finally able to get to a point where he could return to his work, but later during the week he began to have symptoms again.  This time it seemed to be worse as he felt like he wanted to only be in bed and not get up except for going to the bathroom.  He states that his eating is not what it usually is.  He continues to have back aches, as well as shoulder aching.  He also complains of some generalized headache.  There is been no unusual sore throat, no cough, no diarrhea, no constipation, no unusual rash, no unusual abdominal pain.  There is been no weakness of any one side of the upper or lower extremities.  The weakness has been a generalized type weakness.  The patient denies any recent insect bites or tick bites.  He has not had any recent trips out of the country.  He is unsure of any fevers, but states he does not recall any significant chills.  Prior to the 2-week onset, he has not had this problem in the past.  There is been no recent changes in his medications, and he does not have any ongoing medical issues.      Past Medical History:  Diagnosis Date  . Hyperlipidemia     Patient Active Problem List   Diagnosis Date Noted  . Special screening for malignant neoplasms, colon   . Hyperlipidemia LDL goal <130 09/27/2015    Past Surgical History:  Procedure Laterality Date  . APPENDECTOMY     has had scar  tissue removed in that area since appendectomy  . CHOLECYSTECTOMY    . COLONOSCOPY N/A 02/10/2016   Procedure: COLONOSCOPY;  Surgeon: Danie Binder, MD;  Location: [facility];  Service: Endoscopy;  Laterality: N/A;  1:45 PM       Home Medications    Prior to Admission medications   Not on File    Family History Family History  Problem Relation Age of Onset  . Hypertension Mother   . Diabetes Father   . Leukemia Father     Social History Social History   Tobacco Use  . Smoking status: Never Smoker  . Smokeless tobacco: Never Used  Substance Use Topics  . Alcohol use: No  . Drug use: No     Allergies   Patient has no known allergies.   Review of Systems Review of Systems  Constitutional: Positive for fatigue. Negative for activity change.       All ROS Neg except as noted in HPI  HENT: Positive for congestion. Negative for nosebleeds.   Eyes: Negative for photophobia and discharge.  Respiratory: Negative for cough, shortness of breath and wheezing.   Cardiovascular: Negative for chest pain, palpitations and leg swelling.  Gastrointestinal: Negative for abdominal pain and blood in stool.  Genitourinary: Negative for discharge,  dysuria, frequency and hematuria.  Musculoskeletal: Positive for myalgias. Negative for arthralgias, back pain and neck pain.  Skin: Negative.   Neurological: Positive for headaches. Negative for dizziness, seizures and speech difficulty.  Psychiatric/Behavioral: Negative for confusion and hallucinations.     Physical Exam Updated Vital Signs Pulse (!) 110   Temp 99.4 F (37.4 C) (Oral)   Resp 20   Ht 6\' 1"  (1.854 m)   Wt 104.3 kg (230 lb)   SpO2 97%   BMI 30.34 kg/m   Physical Exam  Constitutional: He is oriented to person, place, and time. He appears well-developed and well-nourished.  Non-toxic appearance.  HENT:  Head: Normocephalic.  Right Ear: Tympanic membrane and external ear normal.  Left Ear: Tympanic membrane  and external ear normal.  Nasal congestion present.  The airway is patent.  The uvula is in the midline.  Eyes: EOM and lids are normal. Pupils are equal, round, and reactive to light.  Neck: Normal range of motion. Neck supple. Carotid bruit is not present.  Cardiovascular: Regular rhythm, normal heart sounds, intact distal pulses and normal pulses. Tachycardia present.  Pulmonary/Chest: Breath sounds normal. No respiratory distress.  Patient speaks in complete sentences without problem.  There is symmetrical rise and fall of the chest.  There is rhonchi at the left lower lung.  There is some decrease in breath sounds at both bases.  Abdominal: Soft. Bowel sounds are normal. There is no tenderness. There is no guarding.  Musculoskeletal: Normal range of motion. He exhibits no edema or tenderness.  No hot joints or swollen joints appreciated.  Lymphadenopathy:       Head (right side): No submandibular adenopathy present.       Head (left side): No submandibular adenopathy present.    He has no cervical adenopathy.  Neurological: He is alert and oriented to person, place, and time. He has normal strength. No cranial nerve deficit or sensory deficit.  Skin: Skin is warm and dry.  Psychiatric: He has a normal mood and affect. His speech is normal.  Nursing note and vitals reviewed.    ED Treatments / Results  Labs (all labs ordered are listed, but only abnormal results are displayed) Labs Reviewed  CBG MONITORING, ED - Abnormal; Notable for the following components:      Result Value   Glucose-Capillary 128 (*)    All other components within normal limits  BASIC METABOLIC PANEL  CBC  URINALYSIS, ROUTINE W REFLEX MICROSCOPIC    EKG  EKG Interpretation None       Radiology No results found.  Procedures Procedures (including critical care time) CRITICAL CARE Performed by: Lily Kocher Total critical care time: *35** minutes Critical care time was exclusive of separately  billable procedures and treating other patients. Critical care was necessary to treat or prevent imminent or life-threatening deterioration. Critical care was time spent personally by me on the following activities: development of treatment plan with patient and/or surrogate as well as nursing, discussions with consultants, evaluation of patient's response to treatment, examination of patient, obtaining history from patient or surrogate, ordering and performing treatments and interventions, ordering and review of laboratory studies, ordering and review of radiographic studies, pulse oximetry and re-evaluation of patient's condition.  Medications Ordered in ED Medications - No data to display   Initial Impression / Assessment and Plan / ED Course  I have reviewed the triage vital signs and the nursing notes.  Pertinent labs & imaging results that were available during my care  of the patient were reviewed by me and considered in my medical decision making (see chart for details).       Final Clinical Impressions(s) / ED Diagnoses  MDM  Temperature is 99.4.  Heart rate is elevated at 110-112.  Pulse oximetry is 95-97% on room air.  Blood pressure is steady at 133/74.    Capillary blood glucose is normal at 128, doubt this is related to hypoglycemia. Critical findings noted on complete blood count including white blood cells elevated at 30,700, white blood cells low at 2.7, hemoglobin low at 6.8, hematocrit low at 20.8, and platelets extremely low at 24.  The electrocardiogram shows a sinus tachycardia at 108, there is some left atrial enlargement present.  No evidence of an acute STEMI or life-threatening arrhythmia.  Basic metabolic panel shows the glucose slightly elevated at 144, otherwise well within normal limits.  I have placed a call to oncology.  We will consider doing a pan scan given the above preliminary results.  I have discussed the findings with the patient in terms which  he understands.  Questions were answered.  Patient denies any pain.  He remains tachycardic.  Pulse oximetry 88%, oxygen started at 2 L/min.  The CT scan of the chest shows a hazy groundglass opacity within the lungs bilaterally with a small bilateral effusion.  CT scan of the abdomen shows some mild wall thickening of the distal esophagus which could be related to esophagitis.  Patient has had a cholecystectomy, there is a right lower pole nephrolithiasis, and there is prostate enlargement.  Case discussed with Dr. Wynetta Emery with [facility].  Patient to be admitted to the hospital.  Case discussed with Dr. Higgs-oncology.  Dr. Ishmael Holter will see the patient on consultation.   Final diagnoses:  Community acquired pneumonia, unspecified laterality  Other pancytopenia [facility])    ED Discharge Orders    None       Lily Kocher, PA-C 01/21/18 [place], [place], DO 01/22/18 2216

## 2018-01-21 NOTE — Progress Notes (Signed)
First unit prbc complete with no adverse reaction.

## 2018-01-21 NOTE — ED Notes (Signed)
O2 @ 2L/min via [place] placed on pt

## 2018-01-21 NOTE — ED Triage Notes (Signed)
Patient c/o generalized weakness with aching in shoulders and back. Denies any coughing, nausea, vomiting, or diarrhea. Unsure of any fevers. Per patient some dizziness.

## 2018-01-22 ENCOUNTER — Encounter (HOSPITAL_COMMUNITY): Payer: Self-pay | Admitting: Family Medicine

## 2018-01-22 DIAGNOSIS — D72825 Bandemia: Secondary | ICD-10-CM

## 2018-01-22 DIAGNOSIS — D599 Acquired hemolytic anemia, unspecified: Secondary | ICD-10-CM

## 2018-01-22 DIAGNOSIS — C9500 Acute leukemia of unspecified cell type not having achieved remission: Secondary | ICD-10-CM

## 2018-01-22 LAB — COMPREHENSIVE METABOLIC PANEL
ALBUMIN: 2.6 g/dL — AB (ref 3.5–5.0)
ALK PHOS: 72 U/L (ref 38–126)
ALT: 48 U/L (ref 17–63)
ANION GAP: 11 (ref 5–15)
AST: 42 U/L — ABNORMAL HIGH (ref 15–41)
BUN: 17 mg/dL (ref 6–20)
CALCIUM: 8.1 mg/dL — AB (ref 8.9–10.3)
CO2: 22 mmol/L (ref 22–32)
CREATININE: 0.8 mg/dL (ref 0.61–1.24)
Chloride: 104 mmol/L (ref 101–111)
GFR calc Af Amer: >60 mL/min (ref >60)
GFR calc non Af Amer: >60 mL/min (ref >60)
GLUCOSE: 123 mg/dL — AB (ref 65–99)
Potassium: 3.1 mmol/L — ABNORMAL LOW (ref 3.5–5.1)
Sodium: 137 mmol/L (ref 135–145)
TOTAL PROTEIN: 7.2 g/dL (ref 6.5–8.1)
Total Bilirubin: 1.7 mg/dL — ABNORMAL HIGH (ref 0.3–1.2)

## 2018-01-22 LAB — TYPE AND SCREEN
ABO/RH(D): O POS
Antibody Screen: NEGATIVE
UNIT DIVISION: 0
UNIT DIVISION: 0

## 2018-01-22 LAB — DIFFERENTIAL
BAND NEUTROPHILS: 0 %
BASOS PCT: 0 %
Blasts: 44 %
Eosinophils Relative: 0 %
LYMPHS PCT: 11 %
Metamyelocytes Relative: 0 %
Monocytes Relative: 20 %
Myelocytes: 8 %
NRBC: 0 /100{WBCs}
Neutrophils Relative %: 17 %
Other: 0 %
PROMYELOCYTES ABS: 0 %

## 2018-01-22 LAB — CBC WITH DIFFERENTIAL/PLATELET
BAND NEUTROPHILS: 5 %
BASOS PCT: 0 %
Blasts: 35 %
EOS PCT: 0 %
HEMATOCRIT: 23.2 % — AB (ref 39.0–52.0)
HEMOGLOBIN: 7.8 g/dL — AB (ref 13.0–17.0)
Lymphocytes Relative: 21 %
MCH: 31.5 pg (ref 26.0–34.0)
MCHC: 33.6 g/dL (ref 30.0–36.0)
MCV: 93.5 fL (ref 78.0–100.0)
MYELOCYTES: 3 %
Metamyelocytes Relative: 10 %
Monocytes Relative: 14 %
Neutrophils Relative %: 10 %
Other: 0 %
PROMYELOCYTES ABS: 2 %
Platelets: 16 10*3/uL — CL (ref 150–400)
RBC: 2.48 MIL/uL — ABNORMAL LOW (ref 4.22–5.81)
RDW: 16.1 % — ABNORMAL HIGH (ref 11.5–15.5)
WBC: 29.2 10*3/uL — ABNORMAL HIGH (ref 4.0–10.5)
nRBC: 0 /100 WBC

## 2018-01-22 LAB — PATHOLOGIST SMEAR REVIEW

## 2018-01-22 LAB — BPAM RBC
BLOOD PRODUCT EXPIRATION DATE: 201904082359
BLOOD PRODUCT EXPIRATION DATE: 201904092359
ISSUE DATE / TIME: 201903101620
ISSUE DATE / TIME: 201903102320
UNIT TYPE AND RH: 5100
UNIT TYPE AND RH: 5100

## 2018-01-22 LAB — HIV ANTIBODY (ROUTINE TESTING W REFLEX): HIV SCREEN 4TH GENERATION: NONREACTIVE

## 2018-01-22 LAB — MAGNESIUM: Magnesium: 1.9 mg/dL (ref 1.7–2.4)

## 2018-01-22 LAB — LACTATE DEHYDROGENASE: LDH: 1756 U/L — ABNORMAL HIGH (ref 98–192)

## 2018-01-22 LAB — FERRITIN: Ferritin: 1429 ng/mL — ABNORMAL HIGH (ref 24–336)

## 2018-01-22 LAB — PROTIME-INR
INR: 1.29
Prothrombin Time: 16 seconds — ABNORMAL HIGH (ref 11.4–15.2)

## 2018-01-22 LAB — IRON AND TIBC
IRON: 60 ug/dL (ref 45–182)
SATURATION RATIOS: 33 % (ref 17.9–39.5)
TIBC: 179 ug/dL — ABNORMAL LOW (ref 250–450)
UIBC: 119 ug/dL

## 2018-01-22 LAB — HAPTOGLOBIN: Haptoglobin: 443 mg/dL — ABNORMAL HIGH (ref 34–200)

## 2018-01-22 LAB — FOLATE: FOLATE: 3.7 ng/mL — AB (ref >5.9)

## 2018-01-22 LAB — VITAMIN B12: VITAMIN B 12: 5060 pg/mL — AB (ref 180–914)

## 2018-01-22 LAB — STREP PNEUMONIAE URINARY ANTIGEN: Strep Pneumo Urinary Antigen: NEGATIVE

## 2018-01-22 LAB — APTT: APTT: 43 s — AB (ref 24–36)

## 2018-01-22 MED ORDER — SODIUM CHLORIDE 0.9 % IV SOLN: Freq: Once | INTRAVENOUS | Status: DC

## 2018-01-22 MED ORDER — POTASSIUM CHLORIDE CRYS ER 20 MEQ PO TBCR
40.0000 meq | EXTENDED_RELEASE_TABLET | Freq: Once | ORAL | Status: AC
  Administered 2018-01-22: 40 meq via ORAL
  Filled 2018-01-22: qty 2

## 2018-01-22 NOTE — Progress Notes (Signed)
Patient discharged to [facility] per orders. Patient aware of plan. Patient verbalizes understanding. IV remains per orders. Belongings packed up by patient visitor. Denies needs at this time. Patient off unit via stretcher by [facility].

## 2018-01-22 NOTE — Progress Notes (Signed)
After bowel movement patient had small amount of loose bright red blood in toilet and on toilet paper.

## 2018-01-22 NOTE — Progress Notes (Signed)
[facility] here for transport. They are taking rest of platelets that have not infused with them approximately 75 mls.

## 2018-01-22 NOTE — Consult Note (Signed)
Referring physician Dr. Wynetta Emery  52 year old male who presented to the emergency room complaining complaint of fatigue.  The patient states that this problem started approximately 2 weeks ago.  He describes symptoms of having no energy, pain in his back and shoulders.  He thought that he had an upper respiratory type infection he took Tylenol and NyQuil and Aleve.  He felt symptoms were improving, but later during the week he began to have symptoms again.  This time it seemed to be worse with extreme fatigue.  He  felt like he wanted to be in bed and not get up except for going to the bathroom.  He also reported some anorexia.  He also complains of generalized headache.  There is been no unusual sore throat, no cough, no diarrhea, no constipation, no unusual rash, no unusual abdominal pain.  He has not had any recent trips out of the country.  He is unsure of any fevers, but states he does not recall any significant chills.  Prior to the 2-week onset, he has not had this problem in the past.  There is been no recent changes in his medications, and he does not have any ongoing medical issues.  I have spoken with his primary care physician Dr.  Lance Sell office and he has not had any recent CBCs performed.  Labs done 01/22/2018 showed a white count of 29.2 hemoglobin 7.8 platelets 16,000 review of his peripheral smear shows left shift with increased number of blasts.  I have asked for path review and smear was reviewed by Dr. Gari Crown who indicated pt has  evidence of 40% blasts.  Chemistries done 01/22/2018 show  potassium was 3.1 creatinine is 0.8, calcium 8.1.  Liver function tests within normal limits LDH was 1756.  He has been transfused.  He reports occasional hemorrhoidal bleeding.  He has undergone a CT chest, abdomen, pelvis on 01/21/2018 that showed hazy opacities favoring pneumonitis.  There were borderline lymph nodes with  mild thickening in the esophagus,  prostatic enlargement.  I have discussed the case  with Dr. Jerrye Noble at [facility] and the patient will be transferred for further evaluation and management of possible leukemia.  Past medical history: hyperlipidemia  Past surgical history:    appendectomy cholecystectomy colonoscopy  Family history: Hypertension in his mother, diabetes in his father, unknown leukemia in his father.  He is not aware of if his father was treated.  Social history:  Never smoked, denies any alcohol or drug use  He has no known drug allergies  Meds prior to admission were over-the-counter medications.  Current medications are Tylenol, Zithromax, Rocephin, Advil, Aleve, Ambien.  10 point review of systems is negative except under general fatigue and URI symptoms.  Under hematology reports occasional hemorrhoidal bleeding.  Physical exam temperature is 98.3 heart rate 97 respirations 22 blood pressure is 138/79 pulse ox is 100% on room air  General he is a well-developed well-nourished man in no acute distress  HEENT normocephalic oropharynx  Lungs clear to auscultation no wheezing noted. Cardiovascular regular rate and rhythm no murmurs noted Abdomen soft positive bowel sounds no hepatosplenomegaly he has mild tenderness palpation on the left. Extremities showed no edema Neuro: cranial nerves II through XII are intact.  He has normal grip strength and no focal deficits noted. Skin no rashes no petechiae noted  Labs done 01/22/2018 white count 29.2 HB7.8 platelets 16,000.  Review of his peripheral smear shows a left shift with increased number  of blasts.  I asked for path review and this was done by Dr. Gari Crown who indicated patient had evidence of 40% myeloid blasts.  Chemistries done 01/22/2018 potassium was 3.1 creatinine 0.8 calcium 8.1 liver function tests were within normal limits LDH was 1756.   CT chest, abdomen, pelvis on 01/21/2018 that showed hazy opacities favoring pneumonitis.  There were borderline lymph nodes  with  mild thickening in the esophagus,  prostatic enlargement.  Impression and plan:  1. Leukocytosis.  Review of his peripheral smear with pathology Dr. Gari Crown shows evidence of 40% myeloid blasts.  I have discussed the case with Dr. Florene Glen at [facility].  The patient will be transferred there for further evaluation and management of possible leukemia.  He is hemodynamically stable with normal renal function.    2.  Anemia.  Hemoglobin is 7.8.  This is likely secondary to abnormal findings that are suggestive of acute leukemia.  He has been transfused.  3.  Thrombocytopenia.  Platelet count is 16,000.  I have requested 1 unit of pheresis platelets for this patient.  4.  Pneumonitis.  This was noted on recent CT of the chest.  He is currently on Zithromax and Rocephin and is afebrile.  5.  Disposition.  Patient will be transferred to [facility] for further management.  CC Dr. Wynetta Emery of [facility].

## 2018-01-22 NOTE — Progress Notes (Addendum)
CRITICAL VALUE ALERT  Critical Value:  Platelets 16  Date & Time Notied:  01/22/18 0605  Provider Notified: Darrick Meigs   Orders Received/Actions taken: no new orders, will have day nurse follow up

## 2018-01-22 NOTE — Progress Notes (Signed)
Multiple transport agencies called and [facility] stated they could transport after 7 PM tonight all others were 10PM or latter.

## 2018-01-22 NOTE — Discharge Summary (Addendum)
Physician Discharge Summary  Bryce Hamilton WLN:[id number] DOB: 1966-10-28 DOA: 01/21/2018  PCP: Mikey Kirschner, MD  Admit date: 01/21/2018 Discharge date: 01/22/2018  Admitted From: Home  Disposition: [place]  Discharge Condition: STABLE, GUARDED  CODE STATUS: FULL    Brief Hospitalization Summary: Please see all hospital notes, images, labs for full details of the hospitalization. HPI: Bryce Hamilton is a 52 y.o. male nonsmoker with PMH of hyperlipidemia, had a reported normal colonscopy in 2017 with the exception of hemorrhoids, presented to ED with complaints of 2 weeks of what he describes as extreme fatigue that started to get better for a short time but then returned and became worse.  He has not been able to get out of bed for the past couple of days.  He describes shortness of breath with any type of activity.  He denies chest pain.  He denies fever and chills.  He has been coughing. He describes his symptoms as having no energy, pain in his back and shoulders. He thought that he had an upper respiratory type infection he took Tylenol and NyQuil. He was finally able to get to a point where he could return to his work, but later during the week he began to have symptoms again. This time it seemed to be worse as he felt like he wanted to only be in bed and not get up except for going to the bathroom. He states that his eating is not what it usually is. He continues to have back aches, as well as shoulder aching. He also complains of some generalized headache. There is been no unusual sore throat, no cough, no diarrhea, no constipation, no unusual rash, no unusual abdominal pain. There is been no weakness of any one side of the upper or lower extremities. The weakness has been a generalized type weakness. The patient denies any recent insect bites or tick bites. He has not had any recent trips out of the country. He is unsure of any fevers, but states he does not  recall any significant chills. Prior to the 2-week onset, he has not had this problem in the past. There is been no recent changes in his medications, and he does not have any ongoing medical issues.  The patient says that he donates blood regularly but has not done so in the last 9 months.  His father died of leukemia.   ED Course: The patient was seen in ED and noted to be weak and had abnormal labs consisting of a white blood cell count of 30.7.  Hemoglobin was 6.8.  He had a platelet count of 24.  He had a market left shift noted on peripheral blood smear.  He had a chest x-ray that suggested pneumonia and a subsequent CT chest that suggested pneumonia.  His mono screen was negative.  Blood cultures were taken from the patient and he was started on IV antibiotics.  He is being admitted to the hospital for further treatment.   1. Community-acquired pneumonia-admitted to the hospital for supportive treatment with IV antibiotics and supportive care.  Oxygen as needed.  Follow blood cultures. 2. Severe thrombocytopenia-He is not actively bleeding at this time.   Hematology consulted and 1 unit platelets ordered.  3. Leukocytosis-the patient is noted to have a marked left shift and this could be secondary to the acute infection however WBC not improving with antibiotic therapy and there is some concern about leukemia.  We did request a hematology oncology  consult who is concerned about acute leukemia.  4. Acute leukemia - I spoke with hematologist and she has made arrangements for patient to transfer to [facility] under Dr. Florene Glen with hematology for further evaluation, diagnosis and management. The patient is transferring today.   5. Generalized weakness-likely secondary to the anemia and acute illness.  s/p 2 units of packed red blood cells and patient remains weak and fatigued. 6. Hemolytic Anemia of unknown cause-s/p 2 units of packed red blood cells.  Obtain hematology evaluate at [facility] as noted above.   7. Sinus tachycardia-secondary to the acute illness and treating with supportive therapy, continuing IV fluids and treatment for pneumonia.  DVT Prophylaxis: SCDs Code Status: Full   Family Communication: bedside  Disposition Plan: transfer to [place]   Discharge Diagnoses:  Principal Problem:   CAP (community acquired pneumonia) Active Problems:   Hemolytic anemia ([place])   Hyperlipidemia   Thrombocytopenia ([facility])   Anemia   Leukocytosis   Generalized weakness  Discharge Instructions:  Allergies as of 01/22/2018   No Known Allergies     Medication List    STOP taking these medications   ibuprofen 200 MG tablet Commonly known as:  ADVIL,MOTRIN   naproxen sodium 220 MG tablet Commonly known as:  ALEVE       No Known Allergies Allergies as of 01/22/2018   No Known Allergies     Medication List    STOP taking these medications   ibuprofen 200 MG tablet Commonly known as:  ADVIL,MOTRIN   naproxen sodium 220 MG tablet Commonly known as:  ALEVE      Procedures/Studies: Dg Chest 2 View  Result Date: 01/21/2018 CLINICAL DATA:  Generalized weakness. EXAM: CHEST - 2 VIEW COMPARISON:  None. FINDINGS: The heart size and mediastinal contours are within normal limits. Mildly increased interstitial markings. No focal consolidation, pleural effusion, or pneumothorax. No acute osseous abnormality. IMPRESSION: 1. Mildly increased interstitial markings which can be seen with edema or interstitial pneumonia. Electronically Signed   By: Titus Dubin M.D.   On: 01/21/2018 10:44   Ct Chest W Contrast  Result Date: 01/21/2018 CLINICAL DATA:  Pancytopenia, weakness, fatigue EXAM: CT CHEST, ABDOMEN, AND PELVIS WITH CONTRAST TECHNIQUE: Multidetector CT imaging of the chest, abdomen and pelvis was performed following the standard protocol during bolus administration of intravenous contrast. CONTRAST:  189mL ISOVUE-300 IOPAMIDOL (ISOVUE-300) INJECTION 61%  COMPARISON:  None. FINDINGS: CT CHEST FINDINGS Cardiovascular: Heart is normal size. Aorta is normal caliber. Mediastinum/Nodes: Small scattered borderline size mediastinal lymph nodes. AP window lymph node has a short axis diameter of 9 mm. Right paratracheal lymph node has a short axis diameter of 7 mm. No axillary or hilar adenopathy. Slight wall thickening in the distal esophagus. Small hiatal hernia. Trachea unremarkable. Lungs/Pleura: Calcified granuloma in the right upper lobe. There are hazy ground-glass airspace opacities in the lungs bilaterally. Given the normal heart size, favor this being infectious or inflammatory. Small right pleural effusion. Trace left effusion. Dependent atelectasis in the lower lobes. Musculoskeletal: No acute bony abnormality. CT ABDOMEN PELVIS FINDINGS Hepatobiliary: Prior cholecystectomy.  No focal hepatic abnormality. Pancreas: No focal abnormality or ductal dilatation. Spleen: No focal abnormality.  Normal size. Adrenals/Urinary Tract: Small cysts in the kidneys bilaterally. Punctate nonobstructing stone in the lower pole of the right kidney. Parenchymal calcifications in the upper pole of the left kidney. No hydronephrosis. No adrenal mass. Urinary bladder is unremarkable. Stomach/Bowel: Postoperative changes within small bowel loops in the lower pelvis. No evidence  of bowel obstruction. Vascular/Lymphatic: Scattered aortic calcifications. No evidence of aneurysm or adenopathy. Reproductive: Mildly prominent prostate. Other: No free fluid or free air. Musculoskeletal: No acute bony abnormality. IMPRESSION: Hazy ground-glass opacities within the lungs bilaterally with small bilateral effusions. Given the normal heart size, I favor this is infectious or inflammatory pneumonitis. Scattered borderline sized mediastinal lymph nodes. No hilar or axillary adenopathy. Mild wall thickening in the distal esophagus which could be related to esophagitis. Small hiatal hernia. Small  bilateral renal cysts. Prior cholecystectomy. Right lower pole nephrolithiasis. Prostate enlargement. Electronically Signed   By: Rolm Baptise M.D.   On: 01/21/2018 12:05   Ct Abdomen Pelvis W Contrast  Result Date: 01/21/2018 CLINICAL DATA:  Pancytopenia, weakness, fatigue EXAM: CT CHEST, ABDOMEN, AND PELVIS WITH CONTRAST TECHNIQUE: Multidetector CT imaging of the chest, abdomen and pelvis was performed following the standard protocol during bolus administration of intravenous contrast. CONTRAST:  115mL ISOVUE-300 IOPAMIDOL (ISOVUE-300) INJECTION 61% COMPARISON:  None. FINDINGS: CT CHEST FINDINGS Cardiovascular: Heart is normal size. Aorta is normal caliber. Mediastinum/Nodes: Small scattered borderline size mediastinal lymph nodes. AP window lymph node has a short axis diameter of 9 mm. Right paratracheal lymph node has a short axis diameter of 7 mm. No axillary or hilar adenopathy. Slight wall thickening in the distal esophagus. Small hiatal hernia. Trachea unremarkable. Lungs/Pleura: Calcified granuloma in the right upper lobe. There are hazy ground-glass airspace opacities in the lungs bilaterally. Given the normal heart size, favor this being infectious or inflammatory. Small right pleural effusion. Trace left effusion. Dependent atelectasis in the lower lobes. Musculoskeletal: No acute bony abnormality. CT ABDOMEN PELVIS FINDINGS Hepatobiliary: Prior cholecystectomy.  No focal hepatic abnormality. Pancreas: No focal abnormality or ductal dilatation. Spleen: No focal abnormality.  Normal size. Adrenals/Urinary Tract: Small cysts in the kidneys bilaterally. Punctate nonobstructing stone in the lower pole of the right kidney. Parenchymal calcifications in the upper pole of the left kidney. No hydronephrosis. No adrenal mass. Urinary bladder is unremarkable. Stomach/Bowel: Postoperative changes within small bowel loops in the lower pelvis. No evidence of bowel obstruction. Vascular/Lymphatic: Scattered aortic  calcifications. No evidence of aneurysm or adenopathy. Reproductive: Mildly prominent prostate. Other: No free fluid or free air. Musculoskeletal: No acute bony abnormality. IMPRESSION: Hazy ground-glass opacities within the lungs bilaterally with small bilateral effusions. Given the normal heart size, I favor this is infectious or inflammatory pneumonitis. Scattered borderline sized mediastinal lymph nodes. No hilar or axillary adenopathy. Mild wall thickening in the distal esophagus which could be related to esophagitis. Small hiatal hernia. Small bilateral renal cysts. Prior cholecystectomy. Right lower pole nephrolithiasis. Prostate enlargement. Electronically Signed   By: Rolm Baptise M.D.   On: 01/21/2018 12:05      Subjective: Pt says that he remains weak.    Discharge Exam: Vitals:   01/22/18 0508 01/22/18 1300  BP: 138/79 (!) 154/85  Pulse: 97 (!) 110  Resp: (!) 22 (!) 22  Temp: 98.3 F (36.8 C) 99.1 F (37.3 C)  SpO2: 100% 92%   Vitals:   01/21/18 2342 01/22/18 0215 01/22/18 0508 01/22/18 1300  BP: 137/76 138/85 138/79 (!) 154/85  Pulse: (!) 104 99 97 (!) 110  Resp: (!) 22 (!) 22 (!) 22 (!) 22  Temp: 98.3 F (36.8 C) 98.6 F (37 C) 98.3 F (36.8 C) 99.1 F (37.3 C)  TempSrc: Oral Oral Oral Oral  SpO2: 98% 99% 100% 92%  Weight:      Height:       General: Pt is alert,  awake, not in acute distress Cardiovascular: RRR, S1/S2 +, no rubs, no gallops Respiratory: CTA bilaterally, no wheezing, no rhonchi Abdominal: Soft, NT, ND, bowel sounds + Extremities: no edema, no cyanosis   The results of significant diagnostics from this hospitalization (including imaging, microbiology, ancillary and laboratory) are listed below for reference.     Microbiology: Recent Results (from the past 240 hour(s))  Blood culture (routine x 2)     Status: None (Preliminary result)   Collection Time: 01/21/18 12:36 PM  Result Value Ref Range Status   Specimen Description RIGHT ANTECUBITAL   Final   Special Requests   Final    BOTTLES DRAWN AEROBIC AND ANAEROBIC Blood Culture adequate volume   Culture   Final    NO GROWTH < 24 HOURS Performed at [facility], [address] [place], [place] [address]    Report Status PENDING  Incomplete  Blood culture (routine x 2)     Status: None (Preliminary result)   Collection Time: 01/21/18 12:37 PM  Result Value Ref Range Status   Specimen Description BLOOD LEFT ARM  Final   Special Requests   Final    BOTTLES DRAWN AEROBIC AND ANAEROBIC Blood Culture adequate volume   Culture   Final    NO GROWTH < 24 HOURS Performed at [facility], [address] [place], [place] [address]    Report Status PENDING  Incomplete     Labs: BNP (last 3 results) No results for input(s): BNP in the last 8760 hours. Basic Metabolic Panel: Recent Labs  Lab 01/21/18 0944 01/21/18 1226 01/22/18 0512  NA 138 136 137  K 3.5 3.5 3.1*  CL 102 102 104  CO2 22 22 22   GLUCOSE 144* 123* 123*  BUN 18 17 17   CREATININE 1.06 0.96 0.80  CALCIUM 9.1 8.3* 8.1*  MG  --   --  1.9   Liver Function Tests: Recent Labs  Lab 01/21/18 1226 01/22/18 0512  AST 40 42*  ALT 46 48  ALKPHOS 63 72  BILITOT 1.6* 1.7*  PROT 7.5 7.2  ALBUMIN 2.8* 2.6*   No results for input(s): LIPASE, AMYLASE in the last 168 hours. No results for input(s): AMMONIA in the last 168 hours. CBC: Recent Labs  Lab 01/21/18 0944 01/22/18 0512  WBC 30.7* 29.2*  HGB 6.8* 7.8*  HCT 20.8* 23.2*  MCV 95.9 93.5  PLT 24* 16*   Cardiac Enzymes: No results for input(s): CKTOTAL, CKMB, CKMBINDEX, TROPONINI in the last 168 hours. BNP: Invalid input(s): POCBNP CBG: Recent Labs  Lab 01/21/18 0948  GLUCAP 128*   D-Dimer No results for input(s): DDIMER in the last 72 hours. Hgb A1c Recent Labs    01/21/18 0944  HGBA1C 7.0*   Lipid Profile No results for input(s): CHOL, HDL, LDLCALC, TRIG, CHOLHDL, LDLDIRECT in the last 72 hours. Thyroid function studies Recent Labs     01/21/18 0950  TSH 2.772   Anemia work up Recent Labs    01/21/18 1226  VITAMINB12 5,060*  FOLATE 3.7*  FERRITIN 1,429*  TIBC 179*  IRON 60  RETICCTPCT 0.6   Urinalysis    Component Value Date/Time   COLORURINE YELLOW 01/21/2018 1250   APPEARANCEUR CLEAR 01/21/2018 1250   LABSPEC 1.026 01/21/2018 1250   PHURINE 6.0 01/21/2018 1250   GLUCOSEU NEGATIVE 01/21/2018 1250   HGBUR NEGATIVE 01/21/2018 1250   BILIRUBINUR NEGATIVE 01/21/2018 1250   KETONESUR NEGATIVE 01/21/2018 1250   PROTEINUR NEGATIVE 01/21/2018 1250   UROBILINOGEN 0.2 10/26/2009 1728  NITRITE NEGATIVE 01/21/2018 1250   LEUKOCYTESUR NEGATIVE 01/21/2018 1250   Sepsis Labs Invalid input(s): PROCALCITONIN,  WBC,  LACTICIDVEN Microbiology Recent Results (from the past 240 hour(s))  Blood culture (routine x 2)     Status: None (Preliminary result)   Collection Time: 01/21/18 12:36 PM  Result Value Ref Range Status   Specimen Description RIGHT ANTECUBITAL  Final   Special Requests   Final    BOTTLES DRAWN AEROBIC AND ANAEROBIC Blood Culture adequate volume   Culture   Final    NO GROWTH < 24 HOURS Performed at [facility], [address], [place], [place] [address]    Report Status PENDING  Incomplete  Blood culture (routine x 2)     Status: None (Preliminary result)   Collection Time: 01/21/18 12:37 PM  Result Value Ref Range Status   Specimen Description BLOOD LEFT ARM  Final   Special Requests   Final    BOTTLES DRAWN AEROBIC AND ANAEROBIC Blood Culture adequate volume   Culture   Final    NO GROWTH < 24 HOURS Performed at [facility], [address] [place], [place] [address]    Report Status PENDING  Incomplete   Time coordinating discharge: 59 MINS  SIGNED:  Irwin Brakeman, MD  [facility] 01/22/2018, 4:02 PM Pager [phone]  If 7PM-7AM, please contact night-coverage [email or web address] Password [email or web address]

## 2018-01-23 LAB — VITAMIN D 25 HYDROXY (VIT D DEFICIENCY, FRACTURES): VIT D 25 HYDROXY: 11.1 ng/mL — AB (ref 30.0–100.0)

## 2018-01-23 LAB — PREPARE PLATELET PHERESIS: Unit division: 0

## 2018-01-23 LAB — HEPATITIS B SURFACE ANTIGEN: Hepatitis B Surface Ag: NEGATIVE

## 2018-01-23 LAB — PROTEIN ELECTROPHORESIS, SERUM
A/G RATIO SPE: 0.7 (ref 0.7–1.7)
ALBUMIN ELP: 2.6 g/dL — AB (ref 2.9–4.4)
ALPHA-1-GLOBULIN: 0.6 g/dL — AB (ref 0.0–0.4)
ALPHA-2-GLOBULIN: 1.1 g/dL — AB (ref 0.4–1.0)
Beta Globulin: 0.9 g/dL (ref 0.7–1.3)
GLOBULIN, TOTAL: 3.9 g/dL (ref 2.2–3.9)
Gamma Globulin: 1.3 g/dL (ref 0.4–1.8)
TOTAL PROTEIN ELP: 6.5 g/dL (ref 6.0–8.5)

## 2018-01-23 LAB — BPAM PLATELET PHERESIS
BLOOD PRODUCT EXPIRATION DATE: 201903132359
ISSUE DATE / TIME: 201903111743
Unit Type and Rh: 6200

## 2018-01-23 LAB — HEPATITIS PANEL, ACUTE
Hep A IgM: NEGATIVE
Hep B C IgM: NEGATIVE
Hepatitis B Surface Ag: NEGATIVE

## 2018-01-23 LAB — HEPATITIS B SURFACE ANTIBODY,QUALITATIVE: HEP B S AB: NONREACTIVE

## 2018-01-24 LAB — HEPATITIS C VRS RNA DETECT BY PCR-QUAL: Hepatitis C Vrs RNA by PCR-Qual: NEGATIVE

## 2018-01-26 LAB — BCR-ABL1, CML/ALL, PCR, QUANT

## 2018-01-26 LAB — CULTURE, BLOOD (ROUTINE X 2)
CULTURE: NO GROWTH
CULTURE: NO GROWTH
Special Requests: ADEQUATE
Special Requests: ADEQUATE

## 2018-02-28 MED ORDER — GENERIC EXTERNAL MEDICATION
5.00 | Status: DC
Start: ? — End: 2018-02-28

## 2018-02-28 MED ORDER — GENERIC EXTERNAL MEDICATION
1.00 | Status: DC
Start: ? — End: 2018-02-28

## 2018-02-28 MED ORDER — SODIUM CHLORIDE 0.9 % IJ SOLN
20.00 | INTRAMUSCULAR | Status: DC
Start: 2018-03-01 — End: 2018-02-28

## 2018-02-28 MED ORDER — ACYCLOVIR 400 MG PO TABS
400.00 | ORAL_TABLET | ORAL | Status: DC
Start: 2018-02-28 — End: 2018-02-28

## 2018-02-28 MED ORDER — SODIUM CHLORIDE 0.9 % IJ SOLN
20.00 | INTRAMUSCULAR | Status: DC
Start: ? — End: 2018-02-28

## 2018-02-28 MED ORDER — MELATONIN 3 MG PO TABS
6.00 | ORAL_TABLET | ORAL | Status: DC
Start: 2018-02-28 — End: 2018-02-28

## 2018-02-28 MED ORDER — POLYETHYLENE GLYCOL 3350 17 G PO PACK
17.00 g | PACK | ORAL | Status: DC
Start: ? — End: 2018-02-28

## 2018-02-28 MED ORDER — POLYETHYLENE GLYCOL 3350 17 G PO PACK
17.00 g | PACK | ORAL | Status: DC
Start: 2018-02-28 — End: 2018-02-28

## 2018-02-28 MED ORDER — IPRATROPIUM-ALBUTEROL 0.5-2.5 (3) MG/3ML IN SOLN
3.00 | RESPIRATORY_TRACT | Status: DC
Start: ? — End: 2018-02-28

## 2018-02-28 MED ORDER — MOXIFLOXACIN HCL 400 MG PO TABS
400.00 | ORAL_TABLET | ORAL | Status: DC
Start: 2018-02-28 — End: 2018-02-28

## 2018-02-28 MED ORDER — SENNOSIDES-DOCUSATE SODIUM 8.6-50 MG PO TABS
1.00 | ORAL_TABLET | ORAL | Status: DC
Start: 2018-02-28 — End: 2018-02-28

## 2018-02-28 MED ORDER — OXYMETAZOLINE HCL 0.05 % NA SOLN
2.00 | NASAL | Status: DC
Start: ? — End: 2018-02-28

## 2018-02-28 MED ORDER — FINASTERIDE 5 MG PO TABS
5.00 | ORAL_TABLET | ORAL | Status: DC
Start: 2018-03-01 — End: 2018-02-28

## 2018-02-28 MED ORDER — TRAMADOL HCL 50 MG PO TABS
25.00 | ORAL_TABLET | ORAL | Status: DC
Start: ? — End: 2018-02-28

## 2018-02-28 MED ORDER — AMLODIPINE BESYLATE 5 MG PO TABS
5.00 | ORAL_TABLET | ORAL | Status: DC
Start: 2018-03-01 — End: 2018-02-28

## 2018-02-28 MED ORDER — PANTOPRAZOLE SODIUM 40 MG PO TBEC
40.00 | DELAYED_RELEASE_TABLET | ORAL | Status: DC
Start: 2018-03-01 — End: 2018-02-28

## 2018-02-28 MED ORDER — LORAZEPAM 0.5 MG PO TABS
0.50 | ORAL_TABLET | ORAL | Status: DC
Start: ? — End: 2018-02-28

## 2018-02-28 MED ORDER — HYDROXYZINE HCL 25 MG PO TABS
25.00 | ORAL_TABLET | ORAL | Status: DC
Start: 2018-02-28 — End: 2018-02-28

## 2018-02-28 MED ORDER — POSACONAZOLE 100 MG PO TBEC
400.00 | DELAYED_RELEASE_TABLET | ORAL | Status: DC
Start: 2018-03-01 — End: 2018-02-28

## 2018-03-07 ENCOUNTER — Encounter: Payer: Self-pay | Admitting: Family Medicine

## 2018-03-07 ENCOUNTER — Ambulatory Visit (INDEPENDENT_AMBULATORY_CARE_PROVIDER_SITE_OTHER): Payer: BC Managed Care – PPO | Admitting: Family Medicine

## 2018-03-07 VITALS — BP 138/74 | Ht 73.0 in | Wt 201.0 lb

## 2018-03-07 DIAGNOSIS — C9200 Acute myeloblastic leukemia, not having achieved remission: Secondary | ICD-10-CM

## 2018-03-07 NOTE — Progress Notes (Signed)
   Subjective:    Patient ID: Bryce Hamilton, male    DOB: 1966-05-12, 52 y.o.   MRN: [id number]  HPIHospitalization follow up AML.   Pt states no other concerns today.    Due to have repeat bone marrow sample  Dx of AML    Awaits further rec then from oncology after this bone marroe  Energy level decent  Appetite  Energy level falling in the weeks before diagnosis     Still pendong on the work   Complete hospital record reviewed today in patient's presence.  He did receive a flu shot.  Due to have a bone marrow later this week to assess response to chemotherapy.  States overall his energy has improved remarkably.  Breathing better.  Has not returned to work yet.   Review of Systems No headache, no major weight loss or weight gain, no chest pain no back pain abdominal pain no change in bowel habits complete ROS otherwise negative     Objective:   Physical Exam  Alert and oriented, vitals reviewed and stable, NAD ENT-TM's and ext canals WNL bilat via otoscopic exam Soft palate, tonsils and post pharynx WNL via oropharyngeal exam Neck-symmetric, no masses; thyroid nonpalpable and nontender Pulmonary-no tachypnea or accessory muscle use; Clear without wheezes via auscultation Card--no abnrml murmurs, rhythm reg and rate WNL Carotid pulses symmetric, without bruits       Assessment & Plan:  1 impression acute myelogenous leukemia.  Responding thus far to interventions relatively well.  Due to have continued testing soon.  Will be receiving another bone marrow.  Energy overall improved.  Not back to work yet.  Activities improving.  Patient has supportive family very supportive friends and church.  Feels he is hearing on the stress of all this reasonably well.  Greater than 50% of this 25 minute face to face visit was spent in counseling and discussion and coordination of care regarding the above diagnosis/diagnosies

## 2018-03-26 MED ORDER — POLYETHYLENE GLYCOL 3350 17 G PO PACK
17.00 g | PACK | ORAL | Status: DC
Start: 2018-03-26 — End: 2018-03-26

## 2018-03-26 MED ORDER — SODIUM CHLORIDE 0.9 % IV SOLN
INTRAVENOUS | Status: DC
Start: ? — End: 2018-03-26

## 2018-03-26 MED ORDER — HYDROXYZINE HCL 25 MG PO TABS
25.00 | ORAL_TABLET | ORAL | Status: DC
Start: ? — End: 2018-03-26

## 2018-03-26 MED ORDER — GENERIC EXTERNAL MEDICATION
2.00 | Status: DC
Start: 2018-03-26 — End: 2018-03-26

## 2018-03-26 MED ORDER — GENERIC EXTERNAL MEDICATION
50.00 | Status: DC
Start: ? — End: 2018-03-26

## 2018-03-26 MED ORDER — PANTOPRAZOLE SODIUM 40 MG PO TBEC
40.00 | DELAYED_RELEASE_TABLET | ORAL | Status: DC
Start: 2018-03-27 — End: 2018-03-26

## 2018-03-26 MED ORDER — PREDNISOLONE ACETATE 1 % OP SUSP
2.00 | OPHTHALMIC | Status: DC
Start: 2018-03-26 — End: 2018-03-26

## 2018-03-26 MED ORDER — LIDOCAINE-PRILOCAINE 2.5-2.5 % EX CREA
TOPICAL_CREAM | CUTANEOUS | Status: DC
Start: ? — End: 2018-03-26

## 2018-03-26 MED ORDER — LIDOCAINE 5 % EX PTCH
1.00 | MEDICATED_PATCH | CUTANEOUS | Status: DC
Start: 2018-03-27 — End: 2018-03-26

## 2018-03-26 MED ORDER — GENERIC EXTERNAL MEDICATION
50.00 | Status: DC
Start: 2018-03-27 — End: 2018-03-26

## 2018-03-26 MED ORDER — ONDANSETRON HCL 8 MG PO TABS
8.00 | ORAL_TABLET | ORAL | Status: DC
Start: 2018-03-26 — End: 2018-03-26

## 2018-03-26 MED ORDER — FINASTERIDE 5 MG PO TABS
5.00 | ORAL_TABLET | ORAL | Status: DC
Start: 2018-03-27 — End: 2018-03-26

## 2018-03-26 MED ORDER — GENERIC EXTERNAL MEDICATION
5.00 | Status: DC
Start: ? — End: 2018-03-26

## 2018-03-26 MED ORDER — AMLODIPINE BESYLATE 5 MG PO TABS
5.00 | ORAL_TABLET | ORAL | Status: DC
Start: 2018-03-27 — End: 2018-03-26

## 2018-03-26 MED ORDER — LORAZEPAM 1 MG PO TABS
1.00 | ORAL_TABLET | ORAL | Status: DC
Start: ? — End: 2018-03-26

## 2018-03-26 MED ORDER — SENNOSIDES-DOCUSATE SODIUM 8.6-50 MG PO TABS
1.00 | ORAL_TABLET | ORAL | Status: DC
Start: ? — End: 2018-03-26

## 2018-03-26 MED ORDER — TRAMADOL HCL 50 MG PO TABS
50.00 | ORAL_TABLET | ORAL | Status: DC
Start: ? — End: 2018-03-26

## 2018-03-27 ENCOUNTER — Inpatient Hospital Stay (HOSPITAL_COMMUNITY): Payer: BC Managed Care – PPO | Attending: Internal Medicine

## 2018-03-27 ENCOUNTER — Other Ambulatory Visit (HOSPITAL_COMMUNITY): Payer: Self-pay

## 2018-03-27 ENCOUNTER — Encounter (HOSPITAL_COMMUNITY): Payer: Self-pay

## 2018-03-27 VITALS — BP 130/78 | HR 89 | Temp 98.2°F | Resp 18

## 2018-03-27 DIAGNOSIS — C9000 Multiple myeloma not having achieved remission: Secondary | ICD-10-CM

## 2018-03-27 DIAGNOSIS — C9200 Acute myeloblastic leukemia, not having achieved remission: Secondary | ICD-10-CM | POA: Diagnosis not present

## 2018-03-27 MED ORDER — HYDROXYZINE HCL 25 MG PO TABS
25.00 | ORAL_TABLET | ORAL | Status: DC
Start: ? — End: 2018-03-27

## 2018-03-27 MED ORDER — SENNOSIDES-DOCUSATE SODIUM 8.6-50 MG PO TABS
1.00 | ORAL_TABLET | ORAL | Status: DC
Start: ? — End: 2018-03-27

## 2018-03-27 MED ORDER — ONDANSETRON HCL 8 MG PO TABS
8.00 | ORAL_TABLET | ORAL | Status: DC
Start: 2018-03-26 — End: 2018-03-27

## 2018-03-27 MED ORDER — LIDOCAINE-PRILOCAINE 2.5-2.5 % EX CREA
TOPICAL_CREAM | CUTANEOUS | Status: DC
Start: ? — End: 2018-03-27

## 2018-03-27 MED ORDER — GENERIC EXTERNAL MEDICATION
50.00 | Status: DC
Start: ? — End: 2018-03-27

## 2018-03-27 MED ORDER — GENERIC EXTERNAL MEDICATION
50.00 | Status: DC
Start: 2018-03-27 — End: 2018-03-27

## 2018-03-27 MED ORDER — SODIUM CHLORIDE 0.9 % IV SOLN
INTRAVENOUS | Status: DC
Start: ? — End: 2018-03-27

## 2018-03-27 MED ORDER — PANTOPRAZOLE SODIUM 40 MG PO TBEC
40.00 | DELAYED_RELEASE_TABLET | ORAL | Status: DC
Start: 2018-03-27 — End: 2018-03-27

## 2018-03-27 MED ORDER — AMLODIPINE BESYLATE 5 MG PO TABS
5.00 | ORAL_TABLET | ORAL | Status: DC
Start: 2018-03-27 — End: 2018-03-27

## 2018-03-27 MED ORDER — FINASTERIDE 5 MG PO TABS
5.00 | ORAL_TABLET | ORAL | Status: DC
Start: 2018-03-27 — End: 2018-03-27

## 2018-03-27 MED ORDER — PEGFILGRASTIM INJECTION 6 MG/0.6ML ~~LOC~~
6.0000 mg | PREFILLED_SYRINGE | Freq: Once | SUBCUTANEOUS | Status: AC
  Administered 2018-03-27: 6 mg via SUBCUTANEOUS

## 2018-03-27 MED ORDER — PEGFILGRASTIM INJECTION 6 MG/0.6ML ~~LOC~~
PREFILLED_SYRINGE | SUBCUTANEOUS | Status: AC
  Filled 2018-03-27: qty 0.6

## 2018-03-27 NOTE — Patient Instructions (Signed)
Pegfilgrastim injection What is this medicine? PEGFILGRASTIM (PEG fil gra stim) is a long-acting granulocyte colony-stimulating factor that stimulates the growth of neutrophils, a type of white blood cell important in the body's fight against infection. It is used to reduce the incidence of fever and infection in patients with certain types of cancer who are receiving chemotherapy that affects the bone marrow, and to increase survival after being exposed to high doses of radiation. This medicine may be used for other purposes; ask your health care provider or pharmacist if you have questions. COMMON BRAND NAME(S): Neulasta What should I tell my health care provider before I take this medicine? They need to know if you have any of these conditions: -kidney disease -latex allergy -ongoing radiation therapy -sickle cell disease -skin reactions to acrylic adhesives (On-Body Injector only) -an unusual or allergic reaction to pegfilgrastim, filgrastim, other medicines, foods, dyes, or preservatives -pregnant or trying to get pregnant -breast-feeding How should I use this medicine? This medicine is for injection under the skin. If you get this medicine at home, you will be taught how to prepare and give the pre-filled syringe or how to use the On-body Injector. Refer to the patient Instructions for Use for detailed instructions. Use exactly as directed. Tell your healthcare provider immediately if you suspect that the On-body Injector may not have performed as intended or if you suspect the use of the On-body Injector resulted in a missed or partial dose. It is important that you put your used needles and syringes in a special sharps container. Do not put them in a trash can. If you do not have a sharps container, call your pharmacist or healthcare provider to get one. Talk to your pediatrician regarding the use of this medicine in children. While this drug may be prescribed for selected conditions,  precautions do apply. Overdosage: If you think you have taken too much of this medicine contact a poison control center or emergency room at once. NOTE: This medicine is only for you. Do not share this medicine with others. What if I miss a dose? It is important not to miss your dose. Call your doctor or health care professional if you miss your dose. If you miss a dose due to an On-body Injector failure or leakage, a new dose should be administered as soon as possible using a single prefilled syringe for manual use. What may interact with this medicine? Interactions have not been studied. Give your health care provider a list of all the medicines, herbs, non-prescription drugs, or dietary supplements you use. Also tell them if you smoke, drink alcohol, or use illegal drugs. Some items may interact with your medicine. This list may not describe all possible interactions. Give your health care provider a list of all the medicines, herbs, non-prescription drugs, or dietary supplements you use. Also tell them if you smoke, drink alcohol, or use illegal drugs. Some items may interact with your medicine. What should I watch for while using this medicine? You may need blood work done while you are taking this medicine. If you are going to need a MRI, CT scan, or other procedure, tell your doctor that you are using this medicine (On-Body Injector only). What side effects may I notice from receiving this medicine? Side effects that you should report to your doctor or health care professional as soon as possible: -allergic reactions like skin rash, itching or hives, swelling of the face, lips, or tongue -dizziness -fever -pain, redness, or irritation at site   where injected -pinpoint red spots on the skin -red or dark-brown urine -shortness of breath or breathing problems -stomach or side pain, or pain at the shoulder -swelling -tiredness -trouble passing urine or change in the amount of urine Side  effects that usually do not require medical attention (report to your doctor or health care professional if they continue or are bothersome): -bone pain -muscle pain This list may not describe all possible side effects. Call your doctor for medical advice about side effects. You may report side effects to FDA at 1-800-FDA-1088. Where should I keep my medicine? Keep out of the reach of children. Store pre-filled syringes in a refrigerator between 2 and 8 degrees C (36 and 46 degrees F). Do not freeze. Keep in carton to protect from light. Throw away this medicine if it is left out of the refrigerator for more than 48 hours. Throw away any unused medicine after the expiration date. NOTE: This sheet is a summary. It may not cover all possible information. If you have questions about this medicine, talk to your doctor, pharmacist, or health care provider.  2018 Elsevier/Gold Standard (2016-10-27 12:58:03)  

## 2018-03-27 NOTE — Progress Notes (Signed)
Patient for neulasta shot today.  Denied fevers or chills.  Reviewed information about neulasta and written information given to take home.  All questions asked and answered.    Patient tolerated injection with no complaints voiced.  Site clean and dry with no bruising or swelling noted.  Band aid applied.  VSS with discharge and left ambulatory with no s/s of distress noted.

## 2018-03-29 ENCOUNTER — Inpatient Hospital Stay (HOSPITAL_COMMUNITY): Payer: BC Managed Care – PPO

## 2018-03-29 ENCOUNTER — Telehealth (HOSPITAL_COMMUNITY): Payer: Self-pay

## 2018-03-29 ENCOUNTER — Other Ambulatory Visit (HOSPITAL_COMMUNITY): Payer: Self-pay

## 2018-03-29 DIAGNOSIS — C9200 Acute myeloblastic leukemia, not having achieved remission: Secondary | ICD-10-CM | POA: Diagnosis not present

## 2018-03-29 LAB — CBC WITH DIFFERENTIAL/PLATELET
Basophils Absolute: 0 10*3/uL (ref 0.0–0.1)
Basophils Relative: 1 %
EOS PCT: 1 %
Eosinophils Absolute: 0 10*3/uL (ref 0.0–0.7)
HEMATOCRIT: 24.4 % — AB (ref 39.0–52.0)
Hemoglobin: 8.1 g/dL — ABNORMAL LOW (ref 13.0–17.0)
LYMPHS ABS: 0.1 10*3/uL — AB (ref 0.7–4.0)
Lymphocytes Relative: 7 %
MCH: 30.8 pg (ref 26.0–34.0)
MCHC: 33.2 g/dL (ref 30.0–36.0)
MCV: 92.8 fL (ref 78.0–100.0)
MONO ABS: 0 10*3/uL — AB (ref 0.1–1.0)
MONOS PCT: 0 %
NEUTROS ABS: 1.4 10*3/uL — AB (ref 1.7–7.7)
Neutrophils Relative %: 91 %
PLATELETS: 119 10*3/uL — AB (ref 150–400)
RBC: 2.63 MIL/uL — ABNORMAL LOW (ref 4.22–5.81)
RDW: 17.1 % — AB (ref 11.5–15.5)
WBC: 1.6 10*3/uL — ABNORMAL LOW (ref 4.0–10.5)

## 2018-03-29 LAB — COMPREHENSIVE METABOLIC PANEL
ALBUMIN: 3.8 g/dL (ref 3.5–5.0)
ALK PHOS: 57 U/L (ref 38–126)
ALT: 35 U/L (ref 17–63)
AST: 22 U/L (ref 15–41)
Anion gap: 8 (ref 5–15)
BILIRUBIN TOTAL: 1.6 mg/dL — AB (ref 0.3–1.2)
BUN: 17 mg/dL (ref 6–20)
CALCIUM: 9.3 mg/dL (ref 8.9–10.3)
CO2: 26 mmol/L (ref 22–32)
CREATININE: 0.78 mg/dL (ref 0.61–1.24)
Chloride: 105 mmol/L (ref 101–111)
GFR calc Af Amer: >60 mL/min (ref >60)
Glucose, Bld: 73 mg/dL (ref 65–99)
POTASSIUM: 4.1 mmol/L (ref 3.5–5.1)
Sodium: 139 mmol/L (ref 135–145)
TOTAL PROTEIN: 7.4 g/dL (ref 6.5–8.1)

## 2018-03-29 LAB — MAGNESIUM: Magnesium: 1.9 mg/dL (ref 1.7–2.4)

## 2018-03-29 NOTE — Progress Notes (Signed)
Orders for blood bank entered.

## 2018-03-29 NOTE — Telephone Encounter (Signed)
Spoke with the patient regarding lab work for today.  Patient stated he feels good and denied SOB and fatigue.  No problems with bleeding.  Instructed to keep appointments for labs for possible transfusions.  Patient verbalized understanding.    Labs faxed to [facility].

## 2018-04-02 ENCOUNTER — Inpatient Hospital Stay (HOSPITAL_COMMUNITY): Payer: BC Managed Care – PPO

## 2018-04-02 DIAGNOSIS — C9200 Acute myeloblastic leukemia, not having achieved remission: Secondary | ICD-10-CM | POA: Diagnosis not present

## 2018-04-02 LAB — COMPREHENSIVE METABOLIC PANEL
ALT: 23 U/L (ref 17–63)
AST: 16 U/L (ref 15–41)
Albumin: 4 g/dL (ref 3.5–5.0)
Alkaline Phosphatase: 61 U/L (ref 38–126)
Anion gap: 7 (ref 5–15)
BUN: 24 mg/dL — AB (ref 6–20)
CHLORIDE: 104 mmol/L (ref 101–111)
CO2: 26 mmol/L (ref 22–32)
CREATININE: 0.75 mg/dL (ref 0.61–1.24)
Calcium: 9.4 mg/dL (ref 8.9–10.3)
Glucose, Bld: 103 mg/dL — ABNORMAL HIGH (ref 65–99)
POTASSIUM: 4.1 mmol/L (ref 3.5–5.1)
SODIUM: 137 mmol/L (ref 135–145)
TOTAL PROTEIN: 7.7 g/dL (ref 6.5–8.1)
Total Bilirubin: 1 mg/dL (ref 0.3–1.2)

## 2018-04-02 LAB — CBC WITH DIFFERENTIAL/PLATELET
BASOS ABS: 0 10*3/uL (ref 0.0–0.1)
Basophils Relative: 0 %
EOS ABS: 0 10*3/uL (ref 0.0–0.7)
EOS PCT: 0 %
HCT: 21.8 % — ABNORMAL LOW (ref 39.0–52.0)
Hemoglobin: 7.2 g/dL — ABNORMAL LOW (ref 13.0–17.0)
LYMPHS PCT: 96 %
Lymphs Abs: 0.2 10*3/uL (ref 0.7–4.0)
MCH: 30.5 pg (ref 26.0–34.0)
MCHC: 33 g/dL (ref 30.0–36.0)
MCV: 92.4 fL (ref 78.0–100.0)
MONO ABS: 0 10*3/uL (ref 0.1–1.0)
Monocytes Relative: 0 %
Neutro Abs: 0 10*3/uL (ref 1.7–7.7)
Neutrophils Relative %: 4 %
PLATELETS: 42 10*3/uL — AB (ref 150–400)
RBC: 2.36 MIL/uL — ABNORMAL LOW (ref 4.22–5.81)
RDW: 15.5 % (ref 11.5–15.5)
WBC: 0.3 10*3/uL — CL (ref 4.0–10.5)

## 2018-04-02 LAB — SAMPLE TO BLOOD BANK

## 2018-04-02 LAB — MAGNESIUM: MAGNESIUM: 1.8 mg/dL (ref 1.7–2.4)

## 2018-04-02 NOTE — Progress Notes (Unsigned)
CRITICAL VALUE ALERT  Critical Value:  WBC 0.3  Date & Time Notied:  04/02/2018 @ 4268  Provider Notified: Labs faxed to pt's MD at [facility]

## 2018-04-03 ENCOUNTER — Other Ambulatory Visit (HOSPITAL_COMMUNITY): Payer: Self-pay

## 2018-04-03 DIAGNOSIS — D509 Iron deficiency anemia, unspecified: Secondary | ICD-10-CM

## 2018-04-03 LAB — PREPARE RBC (CROSSMATCH)

## 2018-04-03 NOTE — Progress Notes (Signed)
Patient has labs drawn here at [place] for [facility] because he lives in [place]. Critical results were called and faxed to [facility] this am. Per the orders , patient is supposed to get 2 units PRBC for Hgb < 8.0. His Hgb is 7.2. Appointment made for patient to receive 2 units of PRBC's in short stay tomorrow at 0900. Patient notified of appt and to not remove his blue bracelet. Patient verbalized understanding. Spoke with Hoyle Sauer in short stay and Ulice Dash in lab.

## 2018-04-04 ENCOUNTER — Encounter (HOSPITAL_COMMUNITY)
Admission: RE | Admit: 2018-04-04 | Discharge: 2018-04-04 | Disposition: A | Discharge status: Home / Self Care | Payer: BC Managed Care – PPO | Source: Ambulatory Visit | Attending: Hematology | Admitting: Hematology

## 2018-04-04 DIAGNOSIS — D509 Iron deficiency anemia, unspecified: Secondary | ICD-10-CM | POA: Diagnosis present

## 2018-04-04 MED ORDER — DIPHENHYDRAMINE HCL 25 MG PO CAPS
ORAL_CAPSULE | ORAL | Status: AC
  Filled 2018-04-04: qty 1

## 2018-04-04 MED ORDER — SODIUM CHLORIDE 0.9 % IV SOLN
250.0000 mL | Freq: Once | INTRAVENOUS | Status: AC
  Administered 2018-04-04: 250 mL via INTRAVENOUS

## 2018-04-04 MED ORDER — DIPHENHYDRAMINE HCL 25 MG PO CAPS
25.0000 mg | ORAL_CAPSULE | Freq: Once | ORAL | Status: AC
  Administered 2018-04-04: 25 mg via ORAL

## 2018-04-04 MED ORDER — SODIUM CHLORIDE 0.9% FLUSH
INTRAVENOUS | Status: AC
  Filled 2018-04-04: qty 10

## 2018-04-04 MED ORDER — HEPARIN SOD (PORK) LOCK FLUSH 100 UNIT/ML IV SOLN
500.0000 [IU] | Freq: Every day | INTRAVENOUS | Status: AC | PRN
  Administered 2018-04-04: 500 [IU]
  Filled 2018-04-04: qty 5

## 2018-04-04 MED ORDER — ACETAMINOPHEN 325 MG PO TABS
ORAL_TABLET | ORAL | Status: AC
  Filled 2018-04-04: qty 2

## 2018-04-04 MED ORDER — ACETAMINOPHEN 325 MG PO TABS
650.0000 mg | ORAL_TABLET | Freq: Once | ORAL | Status: AC
  Administered 2018-04-04: 650 mg via ORAL

## 2018-04-05 ENCOUNTER — Other Ambulatory Visit (HOSPITAL_COMMUNITY): Payer: Self-pay

## 2018-04-05 ENCOUNTER — Telehealth (HOSPITAL_COMMUNITY): Payer: Self-pay

## 2018-04-05 ENCOUNTER — Inpatient Hospital Stay (HOSPITAL_COMMUNITY): Payer: BC Managed Care – PPO

## 2018-04-05 ENCOUNTER — Other Ambulatory Visit: Payer: Self-pay

## 2018-04-05 ENCOUNTER — Encounter (HOSPITAL_COMMUNITY): Payer: Self-pay | Admitting: Emergency Medicine

## 2018-04-05 ENCOUNTER — Emergency Department (HOSPITAL_COMMUNITY)
Admission: EM | Admit: 2018-04-05 | Discharge: 2018-04-05 | Disposition: A | Discharge status: Home / Self Care | Payer: BC Managed Care – PPO | Attending: Emergency Medicine | Admitting: Emergency Medicine

## 2018-04-05 DIAGNOSIS — R509 Fever, unspecified: Secondary | ICD-10-CM | POA: Insufficient documentation

## 2018-04-05 DIAGNOSIS — R141 Gas pain: Secondary | ICD-10-CM | POA: Insufficient documentation

## 2018-04-05 DIAGNOSIS — C9200 Acute myeloblastic leukemia, not having achieved remission: Secondary | ICD-10-CM

## 2018-04-05 DIAGNOSIS — Z5321 Procedure and treatment not carried out due to patient leaving prior to being seen by health care provider: Secondary | ICD-10-CM

## 2018-04-05 HISTORY — DX: Leukemia, unspecified not having achieved remission: C95.90

## 2018-04-05 LAB — CBC WITH DIFFERENTIAL/PLATELET
Basophils Absolute: 0 10*3/uL (ref 0.0–0.1)
Basophils Relative: 0 %
EOS ABS: 0 10*3/uL (ref 0.0–0.7)
EOS PCT: 4 %
HCT: 25.4 % — ABNORMAL LOW (ref 39.0–52.0)
Hemoglobin: 8.6 g/dL — ABNORMAL LOW (ref 13.0–17.0)
LYMPHS ABS: 0.3 10*3/uL (ref 0.7–4.0)
LYMPHS PCT: 92 %
MCH: 29.6 pg (ref 26.0–34.0)
MCHC: 33.9 g/dL (ref 30.0–36.0)
MCV: 87.3 fL (ref 78.0–100.0)
MONO ABS: 0 10*3/uL (ref 0.1–1.0)
Monocytes Relative: 0 %
Neutro Abs: 0 10*3/uL (ref 1.7–7.7)
Neutrophils Relative %: 4 %
PLATELETS: 6 10*3/uL — AB (ref 150–400)
RBC: 2.91 MIL/uL — ABNORMAL LOW (ref 4.22–5.81)
RDW: 15.3 % (ref 11.5–15.5)
WBC: 0.3 10*3/uL — AB (ref 4.0–10.5)

## 2018-04-05 LAB — BPAM RBC
BLOOD PRODUCT EXPIRATION DATE: 201906172359
Blood Product Expiration Date: 201906172359
ISSUE DATE / TIME: 201905220919
ISSUE DATE / TIME: 201905221135
Unit Type and Rh: 5100
Unit Type and Rh: 5100

## 2018-04-05 LAB — TYPE AND SCREEN
ABO/RH(D): O POS
ANTIBODY SCREEN: NEGATIVE
UNIT DIVISION: 0
UNIT DIVISION: 0

## 2018-04-05 LAB — SAMPLE TO BLOOD BANK

## 2018-04-05 MED ORDER — PANTOPRAZOLE SODIUM 40 MG PO TBEC
40.00 | DELAYED_RELEASE_TABLET | ORAL | Status: DC
Start: 2018-04-11 — End: 2018-04-05

## 2018-04-05 MED ORDER — POLYETHYLENE GLYCOL 3350 17 G PO PACK
17.00 g | PACK | ORAL | Status: DC
Start: ? — End: 2018-04-05

## 2018-04-05 MED ORDER — FINASTERIDE 5 MG PO TABS
5.00 | ORAL_TABLET | ORAL | Status: DC
Start: 2018-04-11 — End: 2018-04-05

## 2018-04-05 MED ORDER — FLUCONAZOLE 200 MG PO TABS
200.00 | ORAL_TABLET | ORAL | Status: DC
Start: 2018-04-07 — End: 2018-04-05

## 2018-04-05 MED ORDER — SENNOSIDES-DOCUSATE SODIUM 8.6-50 MG PO TABS
1.00 | ORAL_TABLET | ORAL | Status: DC
Start: ? — End: 2018-04-05

## 2018-04-05 MED ORDER — DIPHENHYDRAMINE HCL 25 MG PO CAPS: 25.0000 mg | ORAL_CAPSULE | Freq: Once | ORAL | Status: DC

## 2018-04-05 MED ORDER — HYDROXYZINE HCL 25 MG PO TABS
25.00 | ORAL_TABLET | ORAL | Status: DC
Start: ? — End: 2018-04-05

## 2018-04-05 MED ORDER — AMLODIPINE BESYLATE 5 MG PO TABS
5.00 | ORAL_TABLET | ORAL | Status: DC
Start: 2018-04-11 — End: 2018-04-05

## 2018-04-05 MED ORDER — LIDOCAINE-PRILOCAINE 2.5-2.5 % EX CREA
TOPICAL_CREAM | CUTANEOUS | Status: DC
Start: ? — End: 2018-04-05

## 2018-04-05 MED ORDER — GENERIC EXTERNAL MEDICATION
Status: DC
Start: ? — End: 2018-04-05

## 2018-04-05 MED ORDER — ACYCLOVIR 400 MG PO TABS
400.00 | ORAL_TABLET | ORAL | Status: DC
Start: 2018-04-06 — End: 2018-04-05

## 2018-04-05 NOTE — ED Provider Notes (Signed)
It appears patient has left without being seen by a physician.   Merrily Pew, MD 04/05/18 2025

## 2018-04-05 NOTE — ED Triage Notes (Signed)
Pt being treated for leukemia at [place] states has had fever today. C/o gas pains to stomach.

## 2018-04-05 NOTE — Telephone Encounter (Signed)
CRITICAL VALUE ALERT Critical value received:  Wbc-0.3  Date of notification:  04/05/18 Time of notification: 0855 Critical value read back:  Yes.   Nurse who received alert:  M.Nychelle Cassata,LPN MD notified (1st page):  S.Katragadda, MD  CRITICAL VALUE ALERT Critical value received:  Platelets-6 Date of notification:  04/05/18 Time of notification: 0233 Critical value read back:  Yes.   Nurse who received alert:  M.Kirbi Farrugia, LPN MD notified (1st page):  S.Kattragadda, MD  CRITICAL VALUE ALERT Critical value received:  Hgb- 8.6 Date of notification:  04/05/18 Time of notification: 0855 Critical value read back:  Yes.   Nurse who received alert:  M.Joanmarie Tsang,LPN MD notified (1st page):  S.Katragadda, MD  Reviewed labs with Dr. Delton Coombes. Per [facility] orders, patient is to receive 1 unit of PRBC's and 1 unit of platelets. The [facility] does not have an open spot available for patient. Called short stay and spoke with Hoyle Sauer, they can give patient blood and platelets on 5/24 at 10am. Orders entered and released. Notified Sherry in blood bank. Notified patient that he needed blood and platelets and appointment date and time. [facility] and called critical results to them as instructed on orders. Spoke with Elige Ko, RN at [facility].

## 2018-04-05 NOTE — ED Notes (Signed)
Pt states is going to go to his drs in [place]

## 2018-04-06 ENCOUNTER — Encounter (HOSPITAL_COMMUNITY)
Admission: RE | Admit: 2018-04-06 | Discharge: 2018-04-06 | Disposition: A | Discharge status: Home / Self Care | Payer: BC Managed Care – PPO | Source: Ambulatory Visit | Attending: Hematology | Admitting: Hematology

## 2018-04-06 LAB — PREPARE PLATELET PHERESIS: Unit division: 0

## 2018-04-06 LAB — BPAM PLATELET PHERESIS
BLOOD PRODUCT EXPIRATION DATE: 201905252359
UNIT TYPE AND RH: 5100

## 2018-04-06 LAB — PREPARE RBC (CROSSMATCH)

## 2018-04-06 MED ORDER — MAGNESIUM CHLORIDE 64 MG PO TBEC
128.00 | DELAYED_RELEASE_TABLET | ORAL | Status: DC
Start: 2018-04-06 — End: 2018-04-06

## 2018-04-06 MED ORDER — GENERIC EXTERNAL MEDICATION
1.50 g | Status: DC
Start: 2018-04-06 — End: 2018-04-06

## 2018-04-06 MED ORDER — GENERIC EXTERNAL MEDICATION
80.00 | Status: DC
Start: ? — End: 2018-04-06

## 2018-04-06 MED ORDER — SODIUM CHLORIDE 0.9 % IV SOLN
250.00 | INTRAVENOUS | Status: DC
Start: ? — End: 2018-04-06

## 2018-04-06 MED ORDER — GENERIC EXTERNAL MEDICATION
4.50 g | Status: DC
Start: 2018-04-10 — End: 2018-04-06

## 2018-04-06 MED ORDER — ONDANSETRON 4 MG PO TBDP
4.00 | ORAL_TABLET | ORAL | Status: DC
Start: ? — End: 2018-04-06

## 2018-04-06 NOTE — Progress Notes (Signed)
Pt was a no show for his 10:00 blood transfusion appt. Talked with pt's wife. Pt was admitted to [facility] in [place], [place] last pm for elevated temp.

## 2018-04-09 LAB — BPAM RBC
Blood Product Expiration Date: 201906192359
UNIT TYPE AND RH: 5100

## 2018-04-09 LAB — TYPE AND SCREEN
ABO/RH(D): O POS
Antibody Screen: NEGATIVE
Unit division: 0

## 2018-04-10 ENCOUNTER — Other Ambulatory Visit (HOSPITAL_COMMUNITY): Payer: BC Managed Care – PPO

## 2018-04-12 ENCOUNTER — Inpatient Hospital Stay (HOSPITAL_COMMUNITY): Payer: BC Managed Care – PPO

## 2018-04-12 DIAGNOSIS — C9200 Acute myeloblastic leukemia, not having achieved remission: Secondary | ICD-10-CM

## 2018-04-12 LAB — CBC WITH DIFFERENTIAL/PLATELET
BASOS PCT: 0 %
Basophils Absolute: 0 10*3/uL (ref 0.0–0.1)
Eosinophils Absolute: 0 10*3/uL (ref 0.0–0.7)
Eosinophils Relative: 0 %
HEMATOCRIT: 24.7 % — AB (ref 39.0–52.0)
HEMOGLOBIN: 8.2 g/dL — AB (ref 13.0–17.0)
LYMPHS PCT: 13 %
Lymphs Abs: 1.7 10*3/uL (ref 0.7–4.0)
MCH: 29.9 pg (ref 26.0–34.0)
MCHC: 33.2 g/dL (ref 30.0–36.0)
MCV: 90.1 fL (ref 78.0–100.0)
MONO ABS: 0.6 10*3/uL (ref 0.1–1.0)
Monocytes Relative: 5 %
NEUTROS PCT: 82 %
Neutro Abs: 10.6 10*3/uL — ABNORMAL HIGH (ref 1.7–7.7)
Platelets: 155 10*3/uL (ref 150–400)
RBC: 2.74 MIL/uL — ABNORMAL LOW (ref 4.22–5.81)
RDW: 15.2 % (ref 11.5–15.5)
WBC: 12.9 10*3/uL — ABNORMAL HIGH (ref 4.0–10.5)

## 2018-04-12 LAB — SAMPLE TO BLOOD BANK

## 2018-04-16 ENCOUNTER — Other Ambulatory Visit (HOSPITAL_COMMUNITY): Payer: BC Managed Care – PPO

## 2018-04-19 ENCOUNTER — Other Ambulatory Visit (HOSPITAL_COMMUNITY): Payer: BC Managed Care – PPO

## 2018-04-23 MED ORDER — POLYETHYLENE GLYCOL 3350 17 G PO PACK
17.00 g | PACK | ORAL | Status: DC
Start: ? — End: 2018-04-23

## 2018-04-23 MED ORDER — ONDANSETRON HCL 8 MG PO TABS
8.00 | ORAL_TABLET | ORAL | Status: DC
Start: 2018-04-23 — End: 2018-04-23

## 2018-04-23 MED ORDER — GENERIC EXTERNAL MEDICATION
2.00 | Status: DC
Start: 2018-04-23 — End: 2018-04-23

## 2018-04-23 MED ORDER — PANTOPRAZOLE SODIUM 40 MG PO TBEC
40.00 | DELAYED_RELEASE_TABLET | ORAL | Status: DC
Start: 2018-04-24 — End: 2018-04-23

## 2018-04-23 MED ORDER — FINASTERIDE 5 MG PO TABS
5.00 | ORAL_TABLET | ORAL | Status: DC
Start: 2018-04-24 — End: 2018-04-23

## 2018-04-23 MED ORDER — SODIUM CHLORIDE 0.9 % IJ SOLN
20.00 | INTRAMUSCULAR | Status: DC
Start: ? — End: 2018-04-23

## 2018-04-23 MED ORDER — PREDNISOLONE ACETATE 1 % OP SUSP
2.00 | OPHTHALMIC | Status: DC
Start: 2018-04-23 — End: 2018-04-23

## 2018-04-23 MED ORDER — GENERIC EXTERNAL MEDICATION
10.00 | Status: DC
Start: ? — End: 2018-04-23

## 2018-04-23 MED ORDER — AMLODIPINE BESYLATE 5 MG PO TABS
5.00 | ORAL_TABLET | ORAL | Status: DC
Start: 2018-04-24 — End: 2018-04-23

## 2018-04-23 MED ORDER — LORAZEPAM 1 MG PO TABS
1.00 | ORAL_TABLET | ORAL | Status: DC
Start: ? — End: 2018-04-23

## 2018-04-23 MED ORDER — SODIUM CHLORIDE 0.9 % IJ SOLN
20.00 | INTRAMUSCULAR | Status: DC
Start: 2018-04-24 — End: 2018-04-23

## 2018-04-23 MED ORDER — HYDROXYZINE HCL 25 MG PO TABS
25.00 | ORAL_TABLET | ORAL | Status: DC
Start: ? — End: 2018-04-23

## 2018-04-23 MED ORDER — SENNOSIDES-DOCUSATE SODIUM 8.6-50 MG PO TABS
1.00 | ORAL_TABLET | ORAL | Status: DC
Start: ? — End: 2018-04-23

## 2018-04-24 ENCOUNTER — Inpatient Hospital Stay (HOSPITAL_COMMUNITY): Payer: BC Managed Care – PPO | Attending: Internal Medicine

## 2018-04-24 DIAGNOSIS — C9200 Acute myeloblastic leukemia, not having achieved remission: Secondary | ICD-10-CM | POA: Diagnosis not present

## 2018-04-24 MED ORDER — PEGFILGRASTIM INJECTION 6 MG/0.6ML ~~LOC~~
PREFILLED_SYRINGE | SUBCUTANEOUS | Status: AC
  Filled 2018-04-24: qty 0.6

## 2018-04-24 MED ORDER — PREDNISOLONE ACETATE 1 % OP SUSP
2.00 | OPHTHALMIC | Status: DC
Start: 2018-04-23 — End: 2018-04-24

## 2018-04-24 MED ORDER — AMLODIPINE BESYLATE 5 MG PO TABS
5.00 | ORAL_TABLET | ORAL | Status: DC
Start: 2018-04-24 — End: 2018-04-24

## 2018-04-24 MED ORDER — LORAZEPAM 1 MG PO TABS
1.00 | ORAL_TABLET | ORAL | Status: DC
Start: ? — End: 2018-04-24

## 2018-04-24 MED ORDER — SODIUM CHLORIDE 0.9 % IJ SOLN
20.00 | INTRAMUSCULAR | Status: DC
Start: 2018-04-24 — End: 2018-04-24

## 2018-04-24 MED ORDER — PANTOPRAZOLE SODIUM 40 MG PO TBEC
40.00 | DELAYED_RELEASE_TABLET | ORAL | Status: DC
Start: 2018-04-24 — End: 2018-04-24

## 2018-04-24 MED ORDER — FINASTERIDE 5 MG PO TABS
5.00 | ORAL_TABLET | ORAL | Status: DC
Start: 2018-04-24 — End: 2018-04-24

## 2018-04-24 MED ORDER — SODIUM CHLORIDE 0.9 % IJ SOLN
20.00 | INTRAMUSCULAR | Status: DC
Start: ? — End: 2018-04-24

## 2018-04-24 MED ORDER — POLYETHYLENE GLYCOL 3350 17 G PO PACK
17.00 g | PACK | ORAL | Status: DC
Start: ? — End: 2018-04-24

## 2018-04-24 MED ORDER — GENERIC EXTERNAL MEDICATION
10.00 | Status: DC
Start: ? — End: 2018-04-24

## 2018-04-24 MED ORDER — HYDROXYZINE HCL 25 MG PO TABS
25.00 | ORAL_TABLET | ORAL | Status: DC
Start: ? — End: 2018-04-24

## 2018-04-24 MED ORDER — PEGFILGRASTIM INJECTION 6 MG/0.6ML ~~LOC~~
6.0000 mg | PREFILLED_SYRINGE | Freq: Once | SUBCUTANEOUS | Status: AC
  Administered 2018-04-24: 6 mg via SUBCUTANEOUS

## 2018-04-24 MED ORDER — SENNOSIDES-DOCUSATE SODIUM 8.6-50 MG PO TABS
1.00 | ORAL_TABLET | ORAL | Status: DC
Start: ? — End: 2018-04-24

## 2018-04-24 MED ORDER — GENERIC EXTERNAL MEDICATION
2.00 | Status: DC
Start: 2018-04-23 — End: 2018-04-24

## 2018-04-24 MED ORDER — ONDANSETRON HCL 8 MG PO TABS
8.00 | ORAL_TABLET | ORAL | Status: DC
Start: 2018-04-23 — End: 2018-04-24

## 2018-04-24 NOTE — Patient Instructions (Signed)
[place] at [facility] Discharge Instructions  neulasta given Follow up as scheduled.   Thank you for choosing [place] at [facility] to provide your oncology and hematology care.  To afford each patient quality time with our provider, please arrive at least 15 minutes before your scheduled appointment time.   If you have a lab appointment with the [facility] please come in thru the  Main Entrance and check in at the main information desk  You need to re-schedule your appointment should you arrive 10 or more minutes late.  We strive to give you quality time with our providers, and arriving late affects you and other patients whose appointments are after yours.  Also, if you no show three or more times for appointments you may be dismissed from the clinic at the providers discretion.     Again, thank you for choosing [facility].  Our hope is that these requests will decrease the amount of time that you wait before being seen by our physicians.       _____________________________________________________________  Should you have questions after your visit to [facility], please contact our office at [phone] between the hours of 8:30 a.m. and 4:30 p.m.  Voicemails left after 4:30 p.m. will not be returned until the following business day.  For prescription refill requests, have your pharmacy contact our office.       Resources For Cancer Patients and their Caregivers ? American Cancer Society: Can assist with transportation, wigs, general needs, runs Look Good Feel Better.        816-156-7342 ? Cancer Care: Provides financial assistance, online support groups, medication/co-pay assistance.  1-800-813-HOPE 336-707-8397) ? [place] Assists [place] cancer patients and their families through emotional , educational and financial support.  940 187 0595 ? [place] DSS Where to  apply for food stamps, Medicaid and utility assistance. 984-462-5049 ? RCATS: Transportation to medical appointments. 820-244-3563 ? Social Security Administration: May apply for disability if have a Stage IV cancer. 671 860 0561 716-659-4908 ? [employer], Disability and Transit Services: Assists with nutrition, care and transit needs. [place] Support Programs:   > Cancer Support Group  2nd Tuesday of the month 1pm-2pm, Journey Room   > Creative Journey  3rd Tuesday of the month 1130am-1pm, Journey Room

## 2018-04-24 NOTE — Progress Notes (Signed)
See Mar for injection details.   Vitals stable and discharged home from clinic ambulatory. Follow up as scheduled.

## 2018-04-26 ENCOUNTER — Inpatient Hospital Stay (HOSPITAL_COMMUNITY): Payer: BC Managed Care – PPO

## 2018-04-26 DIAGNOSIS — C9200 Acute myeloblastic leukemia, not having achieved remission: Secondary | ICD-10-CM

## 2018-04-26 LAB — CBC WITH DIFFERENTIAL/PLATELET
Basophils Absolute: 0 10*3/uL (ref 0.0–0.1)
Basophils Relative: 0 %
EOS ABS: 0 10*3/uL (ref 0.0–0.7)
EOS PCT: 0 %
HCT: 25.4 % — ABNORMAL LOW (ref 39.0–52.0)
HEMOGLOBIN: 8.1 g/dL — AB (ref 13.0–17.0)
LYMPHS PCT: 1 %
Lymphs Abs: 0.1 10*3/uL — ABNORMAL LOW (ref 0.7–4.0)
MCH: 29.9 pg (ref 26.0–34.0)
MCHC: 31.9 g/dL (ref 30.0–36.0)
MCV: 93.7 fL (ref 78.0–100.0)
MONOS PCT: 0 %
Monocytes Absolute: 0 10*3/uL — ABNORMAL LOW (ref 0.1–1.0)
Neutro Abs: 9.5 10*3/uL — ABNORMAL HIGH (ref 1.7–7.7)
Neutrophils Relative %: 99 %
PLATELETS: 224 10*3/uL (ref 150–400)
RBC: 2.71 MIL/uL — AB (ref 4.22–5.81)
RDW: 15.6 % — ABNORMAL HIGH (ref 11.5–15.5)
WBC: 9.6 10*3/uL (ref 4.0–10.5)

## 2018-04-26 LAB — SAMPLE TO BLOOD BANK

## 2018-04-26 LAB — COMPREHENSIVE METABOLIC PANEL
ALK PHOS: 65 U/L (ref 38–126)
ALT: 23 U/L (ref 17–63)
ANION GAP: 8 (ref 5–15)
AST: 16 U/L (ref 15–41)
Albumin: 3.6 g/dL (ref 3.5–5.0)
BUN: 20 mg/dL (ref 6–20)
CALCIUM: 9.2 mg/dL (ref 8.9–10.3)
CO2: 27 mmol/L (ref 22–32)
CREATININE: 0.82 mg/dL (ref 0.61–1.24)
Chloride: 105 mmol/L (ref 101–111)
Glucose, Bld: 101 mg/dL — ABNORMAL HIGH (ref 65–99)
Potassium: 4.1 mmol/L (ref 3.5–5.1)
SODIUM: 140 mmol/L (ref 135–145)
Total Bilirubin: 1.2 mg/dL (ref 0.3–1.2)
Total Protein: 7.6 g/dL (ref 6.5–8.1)

## 2018-04-26 LAB — MAGNESIUM: Magnesium: 1.9 mg/dL (ref 1.7–2.4)

## 2018-05-03 ENCOUNTER — Inpatient Hospital Stay (HOSPITAL_COMMUNITY): Payer: BC Managed Care – PPO

## 2018-05-03 ENCOUNTER — Other Ambulatory Visit (HOSPITAL_COMMUNITY): Payer: Self-pay

## 2018-05-03 DIAGNOSIS — C9200 Acute myeloblastic leukemia, not having achieved remission: Secondary | ICD-10-CM

## 2018-05-03 DIAGNOSIS — D599 Acquired hemolytic anemia, unspecified: Secondary | ICD-10-CM

## 2018-05-03 LAB — CBC WITH DIFFERENTIAL/PLATELET
Basophils Absolute: 0 10*3/uL (ref 0.0–0.1)
Basophils Relative: 0 %
Eosinophils Absolute: 0 10*3/uL (ref 0.0–0.7)
Eosinophils Relative: 2 %
HCT: 18.5 % — ABNORMAL LOW (ref 39.0–52.0)
HEMOGLOBIN: 6.2 g/dL — AB (ref 13.0–17.0)
LYMPHS ABS: 0.2 10*3/uL (ref 0.7–4.0)
LYMPHS PCT: 28 %
MCH: 30 pg (ref 26.0–34.0)
MCHC: 33.5 g/dL (ref 30.0–36.0)
MCV: 89.4 fL (ref 78.0–100.0)
Monocytes Absolute: 0.1 10*3/uL (ref 0.1–1.0)
Monocytes Relative: 10 %
NEUTROS ABS: 0.4 10*3/uL (ref 1.7–7.7)
NEUTROS PCT: 60 %
PLATELETS: 8 10*3/uL — AB (ref 150–400)
RBC: 2.07 MIL/uL — AB (ref 4.22–5.81)
RDW: 15.3 % (ref 11.5–15.5)
WBC: 0.7 10*3/uL — AB (ref 4.0–10.5)

## 2018-05-03 LAB — COMPREHENSIVE METABOLIC PANEL
ALT: 15 U/L — AB (ref 17–63)
AST: 14 U/L — ABNORMAL LOW (ref 15–41)
Albumin: 3.3 g/dL — ABNORMAL LOW (ref 3.5–5.0)
Alkaline Phosphatase: 53 U/L (ref 38–126)
Anion gap: 8 (ref 5–15)
BUN: 16 mg/dL (ref 6–20)
CALCIUM: 8.8 mg/dL — AB (ref 8.9–10.3)
CHLORIDE: 104 mmol/L (ref 101–111)
CO2: 28 mmol/L (ref 22–32)
CREATININE: 0.77 mg/dL (ref 0.61–1.24)
Glucose, Bld: 107 mg/dL — ABNORMAL HIGH (ref 65–99)
Potassium: 3.3 mmol/L — ABNORMAL LOW (ref 3.5–5.1)
Sodium: 140 mmol/L (ref 135–145)
Total Bilirubin: 0.5 mg/dL (ref 0.3–1.2)
Total Protein: 7.1 g/dL (ref 6.5–8.1)

## 2018-05-03 LAB — PREPARE RBC (CROSSMATCH)

## 2018-05-03 LAB — MAGNESIUM: MAGNESIUM: 1.7 mg/dL (ref 1.7–2.4)

## 2018-05-03 NOTE — Progress Notes (Unsigned)
CRITICAL VALUE ALERT Critical value received:  WBC 0.7, Hemogobin 6.2, Platelets 8 Date of notification:  05-03-2018 Time of notification: 0945 Critical value read back:  Yes.   Nurse who received alert:  C.page RN MD notified (1st page):  Dr. Walden Field

## 2018-05-04 ENCOUNTER — Encounter (HOSPITAL_COMMUNITY): Payer: Self-pay

## 2018-05-04 ENCOUNTER — Inpatient Hospital Stay (HOSPITAL_COMMUNITY): Payer: BC Managed Care – PPO

## 2018-05-04 DIAGNOSIS — C9200 Acute myeloblastic leukemia, not having achieved remission: Secondary | ICD-10-CM

## 2018-05-04 MED ORDER — ACETAMINOPHEN 325 MG PO TABS
ORAL_TABLET | ORAL | Status: AC
  Filled 2018-05-04: qty 2

## 2018-05-04 MED ORDER — SODIUM CHLORIDE 0.9% FLUSH
10.0000 mL | INTRAVENOUS | Status: AC | PRN
  Administered 2018-05-04: 10 mL

## 2018-05-04 MED ORDER — DIPHENHYDRAMINE HCL 25 MG PO CAPS
ORAL_CAPSULE | ORAL | Status: AC
  Filled 2018-05-04: qty 1

## 2018-05-04 MED ORDER — HEPARIN SOD (PORK) LOCK FLUSH 100 UNIT/ML IV SOLN
500.0000 [IU] | Freq: Every day | INTRAVENOUS | Status: AC | PRN
  Administered 2018-05-04: 500 [IU]
  Filled 2018-05-04: qty 5

## 2018-05-04 MED ORDER — ACETAMINOPHEN 325 MG PO TABS
650.0000 mg | ORAL_TABLET | Freq: Once | ORAL | Status: AC
  Administered 2018-05-04: 650 mg via ORAL

## 2018-05-04 MED ORDER — DIPHENHYDRAMINE HCL 25 MG PO CAPS
25.0000 mg | ORAL_CAPSULE | Freq: Once | ORAL | Status: AC
  Administered 2018-05-04: 25 mg via ORAL

## 2018-05-04 MED ORDER — SODIUM CHLORIDE 0.9 % IV SOLN
250.0000 mL | Freq: Once | INTRAVENOUS | Status: AC
  Administered 2018-05-04: 250 mL via INTRAVENOUS

## 2018-05-04 NOTE — Patient Instructions (Signed)
[place] at [facility] Discharge Instructions  Received 2 units of blood and 1 unit of Platelets today. Follow-up as scheduled. Call clinic for any questions or concerns   Thank you for choosing [place] at [facility] to provide your oncology and hematology care.  To afford each patient quality time with our provider, please arrive at least 15 minutes before your scheduled appointment time.   If you have a lab appointment with the [place] please come in thru the  Main Entrance and check in at the main information desk  You need to re-schedule your appointment should you arrive 10 or more minutes late.  We strive to give you quality time with our providers, and arriving late affects you and other patients whose appointments are after yours.  Also, if you no show three or more times for appointments you may be dismissed from the clinic at the providers discretion.     Again, thank you for choosing [facility].  Our hope is that these requests will decrease the amount of time that you wait before being seen by our physicians.       _____________________________________________________________  Should you have questions after your visit to [facility], please contact our office at [phone] between the hours of 8:30 a.m. and 4:30 p.m.  Voicemails left after 4:30 p.m. will not be returned until the following business day.  For prescription refill requests, have your pharmacy contact our office.       Resources For Cancer Patients and their Caregivers ? American Cancer Society: Can assist with transportation, wigs, general needs, runs Look Good Feel Better.        4082009000 ? Cancer Care: Provides financial assistance, online support groups, medication/co-pay assistance.  1-800-813-HOPE 289-190-5282) ? [place] Assists [place] cancer patients and their families through  emotional , educational and financial support.  2090873025 ? [place] DSS Where to apply for food stamps, Medicaid and utility assistance. (220) 887-6336 ? RCATS: Transportation to medical appointments. 684-258-7242 ? Social Security Administration: May apply for disability if have a Stage IV cancer. (365)568-9698 731-860-3184 ? [employer], Disability and Transit Services: Assists with nutrition, care and transit needs. [place] Support Programs:   > Cancer Support Group  2nd Tuesday of the month 1pm-2pm, Journey Room   > Creative Journey  3rd Tuesday of the month 1130am-1pm, Journey Room

## 2018-05-04 NOTE — Progress Notes (Signed)
Bryce Hamilton tolerated 2 units of PRBC's and 1 unit of platelets ( all irradiated) well without complaints or incident. VSS throughout all transfusions and upon discharge. Pt discharged self ambulatory in satisfactory condition

## 2018-05-05 LAB — TYPE AND SCREEN
ABO/RH(D): O POS
Antibody Screen: NEGATIVE
Unit division: 0
Unit division: 0

## 2018-05-05 LAB — BPAM RBC
BLOOD PRODUCT EXPIRATION DATE: 201907152359
BLOOD PRODUCT EXPIRATION DATE: 201907162359
ISSUE DATE / TIME: 201906210926
ISSUE DATE / TIME: 201906211125
UNIT TYPE AND RH: 5100
Unit Type and Rh: 5100

## 2018-05-05 LAB — BPAM PLATELET PHERESIS
Blood Product Expiration Date: 201906222359
ISSUE DATE / TIME: 201906211346
UNIT TYPE AND RH: 9500

## 2018-05-05 LAB — PREPARE PLATELET PHERESIS: Unit division: 0

## 2018-05-07 ENCOUNTER — Other Ambulatory Visit (HOSPITAL_COMMUNITY): Payer: Self-pay

## 2018-05-07 ENCOUNTER — Inpatient Hospital Stay (HOSPITAL_COMMUNITY): Payer: BC Managed Care – PPO

## 2018-05-07 DIAGNOSIS — D599 Acquired hemolytic anemia, unspecified: Secondary | ICD-10-CM

## 2018-05-07 DIAGNOSIS — C9200 Acute myeloblastic leukemia, not having achieved remission: Secondary | ICD-10-CM

## 2018-05-07 DIAGNOSIS — D696 Thrombocytopenia, unspecified: Secondary | ICD-10-CM

## 2018-05-07 LAB — MAGNESIUM: Magnesium: 1.7 mg/dL (ref 1.7–2.4)

## 2018-05-07 LAB — CBC WITH DIFFERENTIAL/PLATELET
BASOS ABS: 0 10*3/uL (ref 0.0–0.1)
BASOS PCT: 0 %
EOS ABS: 0 10*3/uL (ref 0.0–0.7)
EOS PCT: 0 %
HCT: 25.8 % — ABNORMAL LOW (ref 39.0–52.0)
HEMOGLOBIN: 8.7 g/dL — AB (ref 13.0–17.0)
LYMPHS PCT: 7 %
Lymphs Abs: 0.4 10*3/uL — ABNORMAL LOW (ref 0.7–4.0)
MCH: 29.9 pg (ref 26.0–34.0)
MCHC: 33.7 g/dL (ref 30.0–36.0)
MCV: 88.7 fL (ref 78.0–100.0)
Monocytes Absolute: 0.4 10*3/uL (ref 0.1–1.0)
Monocytes Relative: 8 %
Neutro Abs: 4.9 10*3/uL (ref 1.7–7.7)
Neutrophils Relative %: 85 %
Platelets: 19 10*3/uL — CL (ref 150–400)
RBC: 2.91 MIL/uL — AB (ref 4.22–5.81)
RDW: 14.6 % (ref 11.5–15.5)
WBC: 5.7 10*3/uL (ref 4.0–10.5)

## 2018-05-07 LAB — COMPREHENSIVE METABOLIC PANEL
ALBUMIN: 3.5 g/dL (ref 3.5–5.0)
ALK PHOS: 67 U/L (ref 38–126)
ALT: 14 U/L — AB (ref 17–63)
AST: 13 U/L — AB (ref 15–41)
Anion gap: 9 (ref 5–15)
BUN: 17 mg/dL (ref 6–20)
CALCIUM: 9.1 mg/dL (ref 8.9–10.3)
CHLORIDE: 104 mmol/L (ref 101–111)
CO2: 28 mmol/L (ref 22–32)
CREATININE: 0.83 mg/dL (ref 0.61–1.24)
GFR calc Af Amer: >60 mL/min (ref >60)
GFR calc non Af Amer: >60 mL/min (ref >60)
GLUCOSE: 115 mg/dL — AB (ref 65–99)
Potassium: 3.4 mmol/L — ABNORMAL LOW (ref 3.5–5.1)
SODIUM: 141 mmol/L (ref 135–145)
Total Bilirubin: 0.7 mg/dL (ref 0.3–1.2)
Total Protein: 7.5 g/dL (ref 6.5–8.1)

## 2018-05-07 LAB — SAMPLE TO BLOOD BANK

## 2018-05-07 NOTE — Progress Notes (Unsigned)
CRITICAL VALUE ALERT Critical value received:  Platelets 18,000 Date of notification:  05-07-2018 Time of notification: 0920 Critical value read back:  Yes.   Nurse who received alert:  C. Page RN MD notified (1st page):  Dr. Walden Field

## 2018-05-08 ENCOUNTER — Inpatient Hospital Stay (HOSPITAL_COMMUNITY): Payer: BC Managed Care – PPO

## 2018-05-08 ENCOUNTER — Encounter (HOSPITAL_COMMUNITY): Payer: Self-pay

## 2018-05-08 ENCOUNTER — Other Ambulatory Visit: Payer: Self-pay

## 2018-05-08 DIAGNOSIS — D696 Thrombocytopenia, unspecified: Secondary | ICD-10-CM

## 2018-05-08 DIAGNOSIS — C9200 Acute myeloblastic leukemia, not having achieved remission: Secondary | ICD-10-CM | POA: Diagnosis not present

## 2018-05-08 MED ORDER — ACETAMINOPHEN 325 MG PO TABS
650.0000 mg | ORAL_TABLET | Freq: Once | ORAL | Status: AC
  Administered 2018-05-08: 650 mg via ORAL

## 2018-05-08 MED ORDER — DIPHENHYDRAMINE HCL 25 MG PO CAPS
25.0000 mg | ORAL_CAPSULE | Freq: Once | ORAL | Status: AC
  Administered 2018-05-08: 25 mg via ORAL

## 2018-05-08 MED ORDER — ACETAMINOPHEN 325 MG PO TABS
ORAL_TABLET | ORAL | Status: AC
  Filled 2018-05-08: qty 2

## 2018-05-08 MED ORDER — HEPARIN SOD (PORK) LOCK FLUSH 100 UNIT/ML IV SOLN
500.0000 [IU] | Freq: Once | INTRAVENOUS | Status: AC
  Administered 2018-05-08: 500 [IU] via INTRAVENOUS

## 2018-05-08 MED ORDER — DIPHENHYDRAMINE HCL 25 MG PO CAPS
ORAL_CAPSULE | ORAL | Status: AC
  Filled 2018-05-08: qty 1

## 2018-05-08 MED ORDER — SODIUM CHLORIDE 0.9 % IV SOLN
250.0000 mL | Freq: Once | INTRAVENOUS | Status: AC
  Administered 2018-05-08: 250 mL via INTRAVENOUS

## 2018-05-08 NOTE — Progress Notes (Signed)
Tolerated transfusion w/o adverse reaction.  Alert, in no distress.  VSS.  Discharged ambulatory.  

## 2018-05-09 ENCOUNTER — Other Ambulatory Visit (HOSPITAL_COMMUNITY): Payer: Self-pay

## 2018-05-09 DIAGNOSIS — C9200 Acute myeloblastic leukemia, not having achieved remission: Secondary | ICD-10-CM

## 2018-05-09 LAB — PREPARE PLATELET PHERESIS: UNIT DIVISION: 0

## 2018-05-09 LAB — BPAM PLATELET PHERESIS
Blood Product Expiration Date: 201906262359
ISSUE DATE / TIME: 201906251016
UNIT TYPE AND RH: 6200

## 2018-05-10 ENCOUNTER — Other Ambulatory Visit (HOSPITAL_COMMUNITY): Payer: Self-pay

## 2018-05-10 ENCOUNTER — Inpatient Hospital Stay (HOSPITAL_COMMUNITY): Payer: BC Managed Care – PPO

## 2018-05-10 DIAGNOSIS — C9200 Acute myeloblastic leukemia, not having achieved remission: Secondary | ICD-10-CM

## 2018-05-10 LAB — CBC WITH DIFFERENTIAL/PLATELET
Basophils Absolute: 0 10*3/uL (ref 0.0–0.1)
Basophils Relative: 0 %
Eosinophils Absolute: 0 10*3/uL (ref 0.0–0.7)
Eosinophils Relative: 0 %
HEMATOCRIT: 24.6 % — AB (ref 39.0–52.0)
Hemoglobin: 8 g/dL — ABNORMAL LOW (ref 13.0–17.0)
LYMPHS PCT: 7 %
Lymphs Abs: 0.4 10*3/uL — ABNORMAL LOW (ref 0.7–4.0)
MCH: 29.1 pg (ref 26.0–34.0)
MCHC: 32.5 g/dL (ref 30.0–36.0)
MCV: 89.5 fL (ref 78.0–100.0)
MONO ABS: 0.5 10*3/uL (ref 0.1–1.0)
MONOS PCT: 9 %
Neutro Abs: 4 10*3/uL (ref 1.7–7.7)
Neutrophils Relative %: 84 %
Platelets: 81 10*3/uL — ABNORMAL LOW (ref 150–400)
RBC: 2.75 MIL/uL — ABNORMAL LOW (ref 4.22–5.81)
RDW: 14.6 % (ref 11.5–15.5)
WBC: 4.8 10*3/uL (ref 4.0–10.5)

## 2018-05-10 LAB — SAMPLE TO BLOOD BANK

## 2018-05-14 ENCOUNTER — Inpatient Hospital Stay (HOSPITAL_COMMUNITY): Payer: BC Managed Care – PPO | Attending: Hematology

## 2018-05-14 DIAGNOSIS — C9200 Acute myeloblastic leukemia, not having achieved remission: Secondary | ICD-10-CM | POA: Diagnosis present

## 2018-05-14 LAB — CBC WITH DIFFERENTIAL/PLATELET
BASOS ABS: 0 10*3/uL (ref 0.0–0.1)
Basophils Relative: 0 %
EOS PCT: 0 %
Eosinophils Absolute: 0 10*3/uL (ref 0.0–0.7)
HCT: 25.3 % — ABNORMAL LOW (ref 39.0–52.0)
Hemoglobin: 8.2 g/dL — ABNORMAL LOW (ref 13.0–17.0)
Lymphocytes Relative: 11 %
Lymphs Abs: 0.5 10*3/uL — ABNORMAL LOW (ref 0.7–4.0)
MCH: 29.3 pg (ref 26.0–34.0)
MCHC: 32.4 g/dL (ref 30.0–36.0)
MCV: 90.4 fL (ref 78.0–100.0)
MONO ABS: 0.6 10*3/uL (ref 0.1–1.0)
Monocytes Relative: 14 %
Neutro Abs: 3.1 10*3/uL (ref 1.7–7.7)
Neutrophils Relative %: 75 %
PLATELETS: 137 10*3/uL — AB (ref 150–400)
RBC: 2.8 MIL/uL — ABNORMAL LOW (ref 4.22–5.81)
RDW: 14.7 % (ref 11.5–15.5)
WBC: 4.1 10*3/uL (ref 4.0–10.5)

## 2018-05-14 LAB — COMPREHENSIVE METABOLIC PANEL
ALT: 15 U/L (ref 0–44)
ANION GAP: 8 (ref 5–15)
AST: 17 U/L (ref 15–41)
Albumin: 3.4 g/dL — ABNORMAL LOW (ref 3.5–5.0)
Alkaline Phosphatase: 65 U/L (ref 38–126)
BUN: 13 mg/dL (ref 6–20)
CHLORIDE: 105 mmol/L (ref 98–111)
CO2: 29 mmol/L (ref 22–32)
Calcium: 9.1 mg/dL (ref 8.9–10.3)
Creatinine, Ser: 0.95 mg/dL (ref 0.61–1.24)
Glucose, Bld: 118 mg/dL — ABNORMAL HIGH (ref 70–99)
POTASSIUM: 2.9 mmol/L — AB (ref 3.5–5.1)
Sodium: 142 mmol/L (ref 135–145)
Total Bilirubin: 0.5 mg/dL (ref 0.3–1.2)
Total Protein: 7.6 g/dL (ref 6.5–8.1)

## 2018-05-14 LAB — MAGNESIUM: MAGNESIUM: 1.8 mg/dL (ref 1.7–2.4)

## 2018-06-01 ENCOUNTER — Other Ambulatory Visit (HOSPITAL_COMMUNITY): Payer: Self-pay | Admitting: *Deleted

## 2018-06-01 DIAGNOSIS — C9200 Acute myeloblastic leukemia, not having achieved remission: Secondary | ICD-10-CM

## 2018-06-01 MED ORDER — PEGFILGRASTIM INJECTION 6 MG/0.6ML ~~LOC~~: 6.0000 mg | PREFILLED_SYRINGE | Freq: Once | SUBCUTANEOUS | Status: DC

## 2018-06-01 NOTE — Addendum Note (Signed)
Addended by: Henreitta Leber E on: 06/01/2018 02:18 PM   Modules accepted: Orders

## 2018-06-04 ENCOUNTER — Telehealth (HOSPITAL_COMMUNITY): Payer: Self-pay | Admitting: Internal Medicine

## 2018-06-04 MED ORDER — GENERIC EXTERNAL MEDICATION
10.00 | Status: DC
Start: ? — End: 2018-06-04

## 2018-06-04 MED ORDER — LORAZEPAM 1 MG PO TABS
1.00 | ORAL_TABLET | ORAL | Status: DC
Start: ? — End: 2018-06-04

## 2018-06-04 MED ORDER — SODIUM CHLORIDE 0.9 % IV SOLN
INTRAVENOUS | Status: DC
Start: ? — End: 2018-06-04

## 2018-06-04 MED ORDER — FINASTERIDE 5 MG PO TABS
5.00 | ORAL_TABLET | ORAL | Status: DC
Start: 2018-06-05 — End: 2018-06-04

## 2018-06-04 MED ORDER — GENERIC EXTERNAL MEDICATION
2.00 | Status: DC
Start: 2018-06-04 — End: 2018-06-04

## 2018-06-04 MED ORDER — POLYETHYLENE GLYCOL 3350 17 G PO PACK
17.00 g | PACK | ORAL | Status: DC
Start: ? — End: 2018-06-04

## 2018-06-04 MED ORDER — PANTOPRAZOLE SODIUM 40 MG PO TBEC
40.00 | DELAYED_RELEASE_TABLET | ORAL | Status: DC
Start: 2018-06-05 — End: 2018-06-04

## 2018-06-04 MED ORDER — SENNOSIDES-DOCUSATE SODIUM 8.6-50 MG PO TABS
1.00 | ORAL_TABLET | ORAL | Status: DC
Start: ? — End: 2018-06-04

## 2018-06-04 MED ORDER — HYDROXYZINE HCL 25 MG PO TABS
25.00 | ORAL_TABLET | ORAL | Status: DC
Start: ? — End: 2018-06-04

## 2018-06-04 MED ORDER — PREDNISOLONE ACETATE 1 % OP SUSP
2.00 | OPHTHALMIC | Status: DC
Start: 2018-06-04 — End: 2018-06-04

## 2018-06-04 MED ORDER — AMLODIPINE BESYLATE 5 MG PO TABS
5.00 | ORAL_TABLET | ORAL | Status: DC
Start: 2018-06-05 — End: 2018-06-04

## 2018-06-04 MED ORDER — GENERIC EXTERNAL MEDICATION
80.00 | Status: DC
Start: ? — End: 2018-06-04

## 2018-06-04 MED ORDER — ONDANSETRON HCL 8 MG PO TABS
8.00 | ORAL_TABLET | ORAL | Status: DC
Start: 2018-06-04 — End: 2018-06-04

## 2018-06-04 NOTE — Telephone Encounter (Signed)
After rcving a call from a rn reviewer with [employer] stating if we changed our Neulasta req to Undenyca or another biosimilar it could be approved at RN level. Spoke with Urbano Heir and was given ok to change. Pc to [employer] changed request to [facility] and it was still sent to review because of pt ANC levels.

## 2018-06-05 ENCOUNTER — Other Ambulatory Visit: Payer: Self-pay

## 2018-06-05 ENCOUNTER — Inpatient Hospital Stay (HOSPITAL_COMMUNITY): Payer: BC Managed Care – PPO

## 2018-06-05 ENCOUNTER — Ambulatory Visit (HOSPITAL_COMMUNITY): Payer: BC Managed Care – PPO

## 2018-06-05 VITALS — BP 154/80 | HR 89 | Temp 98.1°F | Resp 18

## 2018-06-05 DIAGNOSIS — C9200 Acute myeloblastic leukemia, not having achieved remission: Secondary | ICD-10-CM | POA: Diagnosis not present

## 2018-06-05 MED ORDER — PEGFILGRASTIM-CBQV 6 MG/0.6ML ~~LOC~~ SOSY
PREFILLED_SYRINGE | SUBCUTANEOUS | Status: AC
  Filled 2018-06-05: qty 0.6

## 2018-06-05 MED ORDER — PEGFILGRASTIM-CBQV 6 MG/0.6ML ~~LOC~~ SOSY
6.0000 mg | PREFILLED_SYRINGE | Freq: Once | SUBCUTANEOUS | Status: AC
  Administered 2018-06-05: 6 mg via SUBCUTANEOUS

## 2018-06-05 NOTE — Progress Notes (Signed)
Pt here today for Udenyca injection. Pt given injection in right arm. Pt stable and discharged home ambulatory. Pt to return as scheduled for next visit.

## 2018-06-07 ENCOUNTER — Other Ambulatory Visit (HOSPITAL_COMMUNITY): Payer: Self-pay

## 2018-06-07 ENCOUNTER — Inpatient Hospital Stay (HOSPITAL_COMMUNITY): Payer: BC Managed Care – PPO

## 2018-06-07 DIAGNOSIS — C9200 Acute myeloblastic leukemia, not having achieved remission: Secondary | ICD-10-CM | POA: Diagnosis not present

## 2018-06-07 LAB — CBC WITH DIFFERENTIAL/PLATELET
BASOS ABS: 0 10*3/uL (ref 0.0–0.1)
BASOS PCT: 0 %
EOS ABS: 0 10*3/uL (ref 0.0–0.7)
Eosinophils Relative: 0 %
HEMATOCRIT: 28 % — AB (ref 39.0–52.0)
HEMOGLOBIN: 9.1 g/dL — AB (ref 13.0–17.0)
Lymphocytes Relative: 3 %
Lymphs Abs: 0.1 10*3/uL — ABNORMAL LOW (ref 0.7–4.0)
MCH: 30.6 pg (ref 26.0–34.0)
MCHC: 32.5 g/dL (ref 30.0–36.0)
MCV: 94.3 fL (ref 78.0–100.0)
MONOS PCT: 0 %
Monocytes Absolute: 0 10*3/uL — ABNORMAL LOW (ref 0.1–1.0)
NEUTROS ABS: 2.2 10*3/uL (ref 1.7–7.7)
NEUTROS PCT: 97 %
Platelets: 113 10*3/uL — ABNORMAL LOW (ref 150–400)
RBC: 2.97 MIL/uL — AB (ref 4.22–5.81)
RDW: 17.5 % — ABNORMAL HIGH (ref 11.5–15.5)
WBC: 2.3 10*3/uL — AB (ref 4.0–10.5)

## 2018-06-07 LAB — SAMPLE TO BLOOD BANK

## 2018-06-11 ENCOUNTER — Inpatient Hospital Stay (HOSPITAL_COMMUNITY): Payer: BC Managed Care – PPO

## 2018-06-11 DIAGNOSIS — C9200 Acute myeloblastic leukemia, not having achieved remission: Secondary | ICD-10-CM | POA: Diagnosis not present

## 2018-06-11 LAB — COMPREHENSIVE METABOLIC PANEL
ALT: 29 U/L (ref 0–44)
AST: 19 U/L (ref 15–41)
Albumin: 3.8 g/dL (ref 3.5–5.0)
Alkaline Phosphatase: 64 U/L (ref 38–126)
Anion gap: 6 (ref 5–15)
BUN: 18 mg/dL (ref 6–20)
CO2: 29 mmol/L (ref 22–32)
Calcium: 9.5 mg/dL (ref 8.9–10.3)
Chloride: 104 mmol/L (ref 98–111)
Creatinine, Ser: 0.81 mg/dL (ref 0.61–1.24)
GFR calc Af Amer: >60 mL/min (ref >60)
GFR calc non Af Amer: >60 mL/min (ref >60)
Glucose, Bld: 106 mg/dL — ABNORMAL HIGH (ref 70–99)
Potassium: 3.6 mmol/L (ref 3.5–5.1)
Sodium: 139 mmol/L (ref 135–145)
Total Bilirubin: 0.9 mg/dL (ref 0.3–1.2)
Total Protein: 7.5 g/dL (ref 6.5–8.1)

## 2018-06-11 LAB — SAMPLE TO BLOOD BANK

## 2018-06-11 LAB — CBC WITH DIFFERENTIAL/PLATELET
HEMATOCRIT: 25.5 % — AB (ref 39.0–52.0)
Hemoglobin: 8.4 g/dL — ABNORMAL LOW (ref 13.0–17.0)
MCH: 30.8 pg (ref 26.0–34.0)
MCHC: 32.9 g/dL (ref 30.0–36.0)
MCV: 93.4 fL (ref 78.0–100.0)
Platelets: 34 10*3/uL — ABNORMAL LOW (ref 150–400)
RBC: 2.73 MIL/uL — AB (ref 4.22–5.81)
RDW: 16.8 % — ABNORMAL HIGH (ref 11.5–15.5)
WBC: 0.2 10*3/uL — CL (ref 4.0–10.5)

## 2018-06-11 LAB — MAGNESIUM: MAGNESIUM: 1.8 mg/dL (ref 1.7–2.4)

## 2018-06-11 NOTE — Progress Notes (Unsigned)
CRITICAL VALUE ALERT Critical value received:  WBC 0.2 Differential unable to be performed!  Date of notification:  06/11/18 Time of notification: 4356 Critical value read back:  Yes.   Nurse who received alert:  Venita Lick LPN MD notified (1st page):  Dr. Walden Field @1020   A copy of labs will be faxed to [facility] and the critical will be called to them as well.

## 2018-06-14 ENCOUNTER — Other Ambulatory Visit (HOSPITAL_COMMUNITY): Payer: BC Managed Care – PPO

## 2018-06-18 ENCOUNTER — Other Ambulatory Visit (HOSPITAL_COMMUNITY): Payer: BC Managed Care – PPO

## 2018-06-20 MED ORDER — SENNOSIDES-DOCUSATE SODIUM 8.6-50 MG PO TABS
1.00 | ORAL_TABLET | ORAL | Status: DC
Start: ? — End: 2018-06-20

## 2018-06-20 MED ORDER — HYDROXYZINE HCL 25 MG PO TABS
25.00 | ORAL_TABLET | ORAL | Status: DC
Start: ? — End: 2018-06-20

## 2018-06-20 MED ORDER — ONDANSETRON 4 MG PO TBDP
8.00 | ORAL_TABLET | ORAL | Status: DC
Start: ? — End: 2018-06-20

## 2018-06-20 MED ORDER — TRAMADOL HCL 50 MG PO TABS
50.00 | ORAL_TABLET | ORAL | Status: DC
Start: ? — End: 2018-06-20

## 2018-06-20 MED ORDER — FINASTERIDE 5 MG PO TABS
5.00 | ORAL_TABLET | ORAL | Status: DC
Start: 2018-06-21 — End: 2018-06-20

## 2018-06-20 MED ORDER — LIDOCAINE 5 % EX PTCH
1.00 | MEDICATED_PATCH | CUTANEOUS | Status: DC
Start: 2018-06-21 — End: 2018-06-20

## 2018-06-20 MED ORDER — GENERIC EXTERNAL MEDICATION
1.00 g | Status: DC
Start: 2018-06-21 — End: 2018-06-20

## 2018-06-20 MED ORDER — PANTOPRAZOLE SODIUM 40 MG PO TBEC
40.00 | DELAYED_RELEASE_TABLET | ORAL | Status: DC
Start: 2018-06-21 — End: 2018-06-20

## 2018-06-21 ENCOUNTER — Other Ambulatory Visit (HOSPITAL_COMMUNITY): Payer: BC Managed Care – PPO

## 2018-06-25 ENCOUNTER — Other Ambulatory Visit (HOSPITAL_COMMUNITY)
Admission: RE | Admit: 2018-06-25 | Discharge: 2018-06-25 | Disposition: A | Discharge status: Home / Self Care | Payer: BC Managed Care – PPO | Source: Other Acute Inpatient Hospital | Attending: Hematology and Oncology | Admitting: Hematology and Oncology

## 2018-06-25 ENCOUNTER — Other Ambulatory Visit (HOSPITAL_COMMUNITY): Payer: BC Managed Care – PPO

## 2018-06-25 DIAGNOSIS — R7881 Bacteremia: Secondary | ICD-10-CM | POA: Insufficient documentation

## 2018-06-25 DIAGNOSIS — C9201 Acute myeloblastic leukemia, in remission: Secondary | ICD-10-CM | POA: Insufficient documentation

## 2018-06-25 DIAGNOSIS — R509 Fever, unspecified: Secondary | ICD-10-CM | POA: Diagnosis present

## 2018-06-25 LAB — CBC WITH DIFFERENTIAL/PLATELET
Basophils Absolute: 0 10*3/uL (ref 0.0–0.1)
Basophils Relative: 0 %
EOS ABS: 0 10*3/uL (ref 0.0–0.7)
Eosinophils Relative: 0 %
HEMATOCRIT: 27.1 % — AB (ref 39.0–52.0)
Hemoglobin: 9 g/dL — ABNORMAL LOW (ref 13.0–17.0)
Lymphocytes Relative: 12 %
Lymphs Abs: 0.9 10*3/uL (ref 0.7–4.0)
MCH: 30.4 pg (ref 26.0–34.0)
MCHC: 33.2 g/dL (ref 30.0–36.0)
MCV: 91.6 fL (ref 78.0–100.0)
MONO ABS: 0.5 10*3/uL (ref 0.1–1.0)
MONOS PCT: 7 %
Neutro Abs: 5.9 10*3/uL (ref 1.7–7.7)
Neutrophils Relative %: 81 %
Platelets: 167 10*3/uL (ref 150–400)
RBC: 2.96 MIL/uL — ABNORMAL LOW (ref 4.22–5.81)
RDW: 15.7 % — AB (ref 11.5–15.5)
WBC: 7.3 10*3/uL (ref 4.0–10.5)

## 2018-06-25 LAB — COMPREHENSIVE METABOLIC PANEL
ALT: 57 U/L — ABNORMAL HIGH (ref 0–44)
ANION GAP: 9 (ref 5–15)
AST: 23 U/L (ref 15–41)
Albumin: 3.2 g/dL — ABNORMAL LOW (ref 3.5–5.0)
Alkaline Phosphatase: 78 U/L (ref 38–126)
BILIRUBIN TOTAL: 0.4 mg/dL (ref 0.3–1.2)
BUN: 11 mg/dL (ref 6–20)
CO2: 27 mmol/L (ref 22–32)
Calcium: 9.2 mg/dL (ref 8.9–10.3)
Chloride: 104 mmol/L (ref 98–111)
Creatinine, Ser: 0.77 mg/dL (ref 0.61–1.24)
GFR calc non Af Amer: >60 mL/min (ref >60)
GLUCOSE: 95 mg/dL (ref 70–99)
POTASSIUM: 4.2 mmol/L (ref 3.5–5.1)
SODIUM: 140 mmol/L (ref 135–145)
TOTAL PROTEIN: 7.1 g/dL (ref 6.5–8.1)

## 2018-06-25 LAB — SEDIMENTATION RATE: SED RATE: 130 mm/h — AB (ref 0–16)

## 2018-06-26 ENCOUNTER — Ambulatory Visit: Payer: BC Managed Care – PPO | Admitting: Family Medicine

## 2018-06-26 ENCOUNTER — Encounter: Payer: Self-pay | Admitting: Family Medicine

## 2018-06-26 VITALS — BP 132/82 | Ht 73.0 in | Wt 206.2 lb

## 2018-06-26 DIAGNOSIS — C9200 Acute myeloblastic leukemia, not having achieved remission: Secondary | ICD-10-CM | POA: Diagnosis not present

## 2018-06-26 DIAGNOSIS — A4151 Sepsis due to Escherichia coli [E. coli]: Secondary | ICD-10-CM

## 2018-06-26 LAB — MISC LABCORP TEST (SEND OUT): Labcorp test code: 6627

## 2018-06-26 NOTE — Progress Notes (Signed)
   Subjective:    Patient ID: Bryce Hamilton, male    DOB: 1965/11/26, 52 y.o.   MRN: [id number]  HPI  Patient arrives for a follow up from a recent hospitalization for fever and sepsis in between his chemo treaments  gertting the I v anibiotic onoing thru the    No feve no chills    Handling meds well     Hospital record reviewed from [facility].  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  The same time he developed a 103.9 temperature associated with chills and feeling very bad patient was admitted directly to [facility].  Work-up revealed E. coli sepsis.  Patient was in the hospital 7 days.  Had appropriate intervention at that time.  Still using antibiotics palpation  Patient just had blood work yesterday.  White blood count has risen from 0.2 back up to 7000 with normal differential    Review of Systems No headache, no major weight loss or weight gain, no chest pain no back pain abdominal pain no change in bowel habits complete ROS otherwise negative     Objective:   Physical Exam Alert and oriented, vitals reviewed and stable, NAD ENT-TM's and ext canals WNL bilat via otoscopic exam Soft palate, tonsils and post pharynx WNL via oropharyngeal exam Neck-symmetric, no masses; thyroid nonpalpable and nontender Pulmonary-no tachypnea or accessory muscle use; Clear without wheezes via auscultation Card--no abnrml murmurs, rhythm reg and rate WNL Carotid pulses symmetric, without bruits        Assessment & Plan:  Impression status post sepsis.  Clinically stable at this time warning signs discussed carefully  2.  Leukopenia.  Severe and potentially life-threatening.  Yet occurred because of interventions which may well save his life regarding the acute myeloid leukemia.  Discussed the great length.  Patient aware of the challenges of threading this needle  3.  General questions  answered  Greater than 50% of this 25 minute face to face visit was spent in counseling and discussion and coordination of care regarding the above diagnosis/diagnosies

## 2018-06-28 ENCOUNTER — Other Ambulatory Visit (HOSPITAL_COMMUNITY): Payer: BC Managed Care – PPO

## 2018-07-02 ENCOUNTER — Other Ambulatory Visit (HOSPITAL_COMMUNITY): Payer: BC Managed Care – PPO

## 2018-07-05 ENCOUNTER — Other Ambulatory Visit (HOSPITAL_COMMUNITY): Payer: BC Managed Care – PPO

## 2018-07-09 ENCOUNTER — Encounter (HOSPITAL_COMMUNITY): Payer: Self-pay | Admitting: *Deleted

## 2018-07-09 ENCOUNTER — Other Ambulatory Visit (HOSPITAL_COMMUNITY): Payer: Self-pay | Admitting: *Deleted

## 2018-07-09 ENCOUNTER — Other Ambulatory Visit (HOSPITAL_COMMUNITY): Payer: BC Managed Care – PPO

## 2018-07-09 DIAGNOSIS — C9200 Acute myeloblastic leukemia, not having achieved remission: Secondary | ICD-10-CM

## 2018-07-09 NOTE — Progress Notes (Signed)
[facility] called today to advise that patient will be admitted 8/30 and will need to be followed up with our clinic for labs twice weekly starting 9/5.  Appointments have been made.

## 2018-07-12 ENCOUNTER — Other Ambulatory Visit (HOSPITAL_COMMUNITY): Payer: BC Managed Care – PPO

## 2018-07-16 MED ORDER — GENERIC EXTERNAL MEDICATION
5.00 | Status: DC
Start: ? — End: 2018-07-16

## 2018-07-16 MED ORDER — LORAZEPAM 0.5 MG PO TABS
0.50 | ORAL_TABLET | ORAL | Status: DC
Start: ? — End: 2018-07-16

## 2018-07-16 MED ORDER — ONDANSETRON HCL 8 MG PO TABS
8.00 | ORAL_TABLET | ORAL | Status: DC
Start: ? — End: 2018-07-16

## 2018-07-16 MED ORDER — POLYETHYLENE GLYCOL 3350 17 G PO PACK
17.00 g | PACK | ORAL | Status: DC
Start: ? — End: 2018-07-16

## 2018-07-16 MED ORDER — PANTOPRAZOLE SODIUM 40 MG PO TBEC
40.00 | DELAYED_RELEASE_TABLET | ORAL | Status: DC
Start: 2018-07-17 — End: 2018-07-16

## 2018-07-16 MED ORDER — SENNOSIDES-DOCUSATE SODIUM 8.6-50 MG PO TABS
2.00 | ORAL_TABLET | ORAL | Status: DC
Start: 2018-07-16 — End: 2018-07-16

## 2018-07-16 MED ORDER — TRAMADOL HCL 50 MG PO TABS
50.00 | ORAL_TABLET | ORAL | Status: DC
Start: ? — End: 2018-07-16

## 2018-07-16 MED ORDER — ONDANSETRON HCL 8 MG PO TABS
8.00 | ORAL_TABLET | ORAL | Status: DC
Start: 2018-07-16 — End: 2018-07-16

## 2018-07-16 MED ORDER — GENERIC EXTERNAL MEDICATION
80.00 | Status: DC
Start: ? — End: 2018-07-16

## 2018-07-16 MED ORDER — GENERIC EXTERNAL MEDICATION
2.00 | Status: DC
Start: 2018-07-16 — End: 2018-07-16

## 2018-07-16 MED ORDER — GENERIC EXTERNAL MEDICATION
2.00 | Status: DC
Start: ? — End: 2018-07-16

## 2018-07-16 MED ORDER — PREDNISOLONE ACETATE 1 % OP SUSP
2.00 | OPHTHALMIC | Status: DC
Start: 2018-07-16 — End: 2018-07-16

## 2018-07-17 ENCOUNTER — Inpatient Hospital Stay (HOSPITAL_COMMUNITY): Payer: BC Managed Care – PPO | Attending: Internal Medicine

## 2018-07-17 ENCOUNTER — Encounter (HOSPITAL_COMMUNITY): Payer: Self-pay

## 2018-07-17 VITALS — BP 105/78 | HR 104 | Temp 98.0°F | Resp 18

## 2018-07-17 DIAGNOSIS — C9200 Acute myeloblastic leukemia, not having achieved remission: Secondary | ICD-10-CM

## 2018-07-17 DIAGNOSIS — Z5189 Encounter for other specified aftercare: Secondary | ICD-10-CM | POA: Insufficient documentation

## 2018-07-17 DIAGNOSIS — C9201 Acute myeloblastic leukemia, in remission: Secondary | ICD-10-CM | POA: Insufficient documentation

## 2018-07-17 MED ORDER — ONDANSETRON HCL 8 MG PO TABS
8.00 | ORAL_TABLET | ORAL | Status: DC
Start: ? — End: 2018-07-17

## 2018-07-17 MED ORDER — LORAZEPAM 0.5 MG PO TABS
0.50 | ORAL_TABLET | ORAL | Status: DC
Start: ? — End: 2018-07-17

## 2018-07-17 MED ORDER — SENNOSIDES-DOCUSATE SODIUM 8.6-50 MG PO TABS
2.00 | ORAL_TABLET | ORAL | Status: DC
Start: 2018-07-16 — End: 2018-07-17

## 2018-07-17 MED ORDER — PANTOPRAZOLE SODIUM 40 MG PO TBEC
40.00 | DELAYED_RELEASE_TABLET | ORAL | Status: DC
Start: 2018-07-17 — End: 2018-07-17

## 2018-07-17 MED ORDER — POLYETHYLENE GLYCOL 3350 17 G PO PACK
17.00 g | PACK | ORAL | Status: DC
Start: ? — End: 2018-07-17

## 2018-07-17 MED ORDER — PEGFILGRASTIM INJECTION 6 MG/0.6ML ~~LOC~~
6.0000 mg | PREFILLED_SYRINGE | Freq: Once | SUBCUTANEOUS | Status: AC
  Administered 2018-07-17: 6 mg via SUBCUTANEOUS

## 2018-07-17 MED ORDER — GENERIC EXTERNAL MEDICATION
5.00 | Status: DC
Start: ? — End: 2018-07-17

## 2018-07-17 MED ORDER — ONDANSETRON HCL 8 MG PO TABS
8.00 | ORAL_TABLET | ORAL | Status: DC
Start: 2018-07-16 — End: 2018-07-17

## 2018-07-17 MED ORDER — PEGFILGRASTIM INJECTION 6 MG/0.6ML ~~LOC~~
PREFILLED_SYRINGE | SUBCUTANEOUS | Status: AC
  Filled 2018-07-17: qty 0.6

## 2018-07-17 MED ORDER — GENERIC EXTERNAL MEDICATION
80.00 | Status: DC
Start: ? — End: 2018-07-17

## 2018-07-17 NOTE — Patient Instructions (Signed)
[place] at [facility]  Discharge Instructions:  neulasta shot today.  _______________________________________________________________  Thank you for choosing [place] at [facility] to provide your oncology and hematology care.  To afford each patient quality time with our providers, please arrive at least 15 minutes before your scheduled appointment.  You need to re-schedule your appointment if you arrive 10 or more minutes late.  We strive to give you quality time with our providers, and arriving late affects you and other patients whose appointments are after yours.  Also, if you no show three or more times for appointments you may be dismissed from the clinic.  Again, thank you for choosing [place] at [facility] hope is that these requests will allow you access to exceptional care and in a timely manner. _______________________________________________________________  If you have questions after your visit, please contact our office at [phone] between the hours of 8:30 a.m. and 5:00 p.m. Voicemails left after 4:30 p.m. will not be returned until the following business day. _______________________________________________________________  For prescription refill requests, have your pharmacy contact our office. _______________________________________________________________  Recommendations made by the consultant and any test results will be sent to your referring physician. _______________________________________________________________

## 2018-07-17 NOTE — Progress Notes (Signed)
Orders checked per [facility] protocol for neulasta shot today.  Patient tolerated injection with no complaints voiced.  Site clean and dry with no bruising or swelling noted at site.  Band aid applied.  VSs with discharge and left ambulatory with no s/s of distress noted.

## 2018-07-19 ENCOUNTER — Inpatient Hospital Stay (HOSPITAL_COMMUNITY): Payer: BC Managed Care – PPO

## 2018-07-19 ENCOUNTER — Other Ambulatory Visit (HOSPITAL_COMMUNITY): Payer: BC Managed Care – PPO

## 2018-07-19 DIAGNOSIS — C9201 Acute myeloblastic leukemia, in remission: Secondary | ICD-10-CM | POA: Diagnosis not present

## 2018-07-19 DIAGNOSIS — C9200 Acute myeloblastic leukemia, not having achieved remission: Secondary | ICD-10-CM

## 2018-07-19 LAB — COMPREHENSIVE METABOLIC PANEL
ALT: 33 U/L (ref 0–44)
AST: 23 U/L (ref 15–41)
Albumin: 3.6 g/dL (ref 3.5–5.0)
Alkaline Phosphatase: 65 U/L (ref 38–126)
Anion gap: 9 (ref 5–15)
BUN: 14 mg/dL (ref 6–20)
CO2: 25 mmol/L (ref 22–32)
Calcium: 9.2 mg/dL (ref 8.9–10.3)
Chloride: 106 mmol/L (ref 98–111)
Creatinine, Ser: 0.85 mg/dL (ref 0.61–1.24)
GFR calc Af Amer: >60 mL/min (ref >60)
GFR calc non Af Amer: >60 mL/min (ref >60)
Glucose, Bld: 116 mg/dL — ABNORMAL HIGH (ref 70–99)
POTASSIUM: 3.8 mmol/L (ref 3.5–5.1)
Sodium: 140 mmol/L (ref 135–145)
Total Bilirubin: 1.6 mg/dL — ABNORMAL HIGH (ref 0.3–1.2)
Total Protein: 7.5 g/dL (ref 6.5–8.1)

## 2018-07-19 LAB — CBC WITH DIFFERENTIAL/PLATELET
BASOS ABS: 0 10*3/uL (ref 0.0–0.1)
Basophils Relative: 0 %
Eosinophils Absolute: 0 10*3/uL (ref 0.0–0.7)
Eosinophils Relative: 1 %
HCT: 26.8 % — ABNORMAL LOW (ref 39.0–52.0)
Hemoglobin: 8.8 g/dL — ABNORMAL LOW (ref 13.0–17.0)
Lymphocytes Relative: 2 %
Lymphs Abs: 0.1 10*3/uL — ABNORMAL LOW (ref 0.7–4.0)
MCH: 32 pg (ref 26.0–34.0)
MCHC: 32.8 g/dL (ref 30.0–36.0)
MCV: 97.5 fL (ref 78.0–100.0)
MONO ABS: 0 10*3/uL — AB (ref 0.1–1.0)
MONOS PCT: 0 %
NEUTROS ABS: 4 10*3/uL (ref 1.7–7.7)
Neutrophils Relative %: 97 %
Platelets: 121 10*3/uL — ABNORMAL LOW (ref 150–400)
RBC: 2.75 MIL/uL — ABNORMAL LOW (ref 4.22–5.81)
RDW: 16.8 % — AB (ref 11.5–15.5)
WBC: 4.1 10*3/uL (ref 4.0–10.5)

## 2018-07-19 LAB — MAGNESIUM: Magnesium: 2 mg/dL (ref 1.7–2.4)

## 2018-07-22 ENCOUNTER — Other Ambulatory Visit: Payer: Self-pay

## 2018-07-22 ENCOUNTER — Encounter (HOSPITAL_COMMUNITY): Payer: Self-pay | Admitting: *Deleted

## 2018-07-22 ENCOUNTER — Inpatient Hospital Stay (HOSPITAL_COMMUNITY)
Admission: EM | Admit: 2018-07-22 | Discharge: 2018-07-30 | DRG: 809 | Disposition: A | Discharge status: Home / Self Care | Payer: BC Managed Care – PPO | Attending: Family Medicine | Admitting: Family Medicine

## 2018-07-22 DIAGNOSIS — Z792 Long term (current) use of antibiotics: Secondary | ICD-10-CM

## 2018-07-22 DIAGNOSIS — R17 Unspecified jaundice: Secondary | ICD-10-CM | POA: Diagnosis present

## 2018-07-22 DIAGNOSIS — R7881 Bacteremia: Secondary | ICD-10-CM | POA: Diagnosis present

## 2018-07-22 DIAGNOSIS — E785 Hyperlipidemia, unspecified: Secondary | ICD-10-CM | POA: Diagnosis present

## 2018-07-22 DIAGNOSIS — Z1612 Extended spectrum beta lactamase (ESBL) resistance: Secondary | ICD-10-CM | POA: Diagnosis present

## 2018-07-22 DIAGNOSIS — C9200 Acute myeloblastic leukemia, not having achieved remission: Secondary | ICD-10-CM | POA: Diagnosis present

## 2018-07-22 DIAGNOSIS — Z833 Family history of diabetes mellitus: Secondary | ICD-10-CM

## 2018-07-22 DIAGNOSIS — D709 Neutropenia, unspecified: Secondary | ICD-10-CM

## 2018-07-22 DIAGNOSIS — T451X5A Adverse effect of antineoplastic and immunosuppressive drugs, initial encounter: Secondary | ICD-10-CM | POA: Diagnosis present

## 2018-07-22 DIAGNOSIS — I10 Essential (primary) hypertension: Secondary | ICD-10-CM | POA: Diagnosis present

## 2018-07-22 DIAGNOSIS — B9620 Unspecified Escherichia coli [E. coli] as the cause of diseases classified elsewhere: Secondary | ICD-10-CM | POA: Diagnosis present

## 2018-07-22 DIAGNOSIS — D61810 Antineoplastic chemotherapy induced pancytopenia: Secondary | ICD-10-CM | POA: Diagnosis not present

## 2018-07-22 DIAGNOSIS — D61818 Other pancytopenia: Secondary | ICD-10-CM

## 2018-07-22 DIAGNOSIS — X58XXXA Exposure to other specified factors, initial encounter: Secondary | ICD-10-CM | POA: Diagnosis present

## 2018-07-22 DIAGNOSIS — Z9221 Personal history of antineoplastic chemotherapy: Secondary | ICD-10-CM

## 2018-07-22 DIAGNOSIS — Z806 Family history of leukemia: Secondary | ICD-10-CM

## 2018-07-22 DIAGNOSIS — R000 Tachycardia, unspecified: Secondary | ICD-10-CM | POA: Diagnosis present

## 2018-07-22 DIAGNOSIS — Z793 Long term (current) use of hormonal contraceptives: Secondary | ICD-10-CM

## 2018-07-22 DIAGNOSIS — Z9049 Acquired absence of other specified parts of digestive tract: Secondary | ICD-10-CM

## 2018-07-22 DIAGNOSIS — Z8249 Family history of ischemic heart disease and other diseases of the circulatory system: Secondary | ICD-10-CM

## 2018-07-22 DIAGNOSIS — Z79899 Other long term (current) drug therapy: Secondary | ICD-10-CM

## 2018-07-22 DIAGNOSIS — R5081 Fever presenting with conditions classified elsewhere: Secondary | ICD-10-CM | POA: Diagnosis present

## 2018-07-22 HISTORY — DX: Essential (primary) hypertension: I10

## 2018-07-22 MED ORDER — ACETAMINOPHEN 325 MG PO TABS
650.0000 mg | ORAL_TABLET | Freq: Once | ORAL | Status: AC
  Administered 2018-07-23: 650 mg via ORAL
  Filled 2018-07-22: qty 2

## 2018-07-22 NOTE — ED Triage Notes (Signed)
Pt c/o fever that started tonight, finished chemo last week

## 2018-07-23 ENCOUNTER — Emergency Department (HOSPITAL_COMMUNITY): Payer: BC Managed Care – PPO

## 2018-07-23 ENCOUNTER — Other Ambulatory Visit (HOSPITAL_COMMUNITY): Payer: BC Managed Care – PPO

## 2018-07-23 ENCOUNTER — Encounter (HOSPITAL_COMMUNITY): Payer: Self-pay | Admitting: Internal Medicine

## 2018-07-23 ENCOUNTER — Other Ambulatory Visit: Payer: Self-pay

## 2018-07-23 DIAGNOSIS — X58XXXA Exposure to other specified factors, initial encounter: Secondary | ICD-10-CM | POA: Diagnosis present

## 2018-07-23 DIAGNOSIS — Z792 Long term (current) use of antibiotics: Secondary | ICD-10-CM | POA: Diagnosis not present

## 2018-07-23 DIAGNOSIS — D709 Neutropenia, unspecified: Secondary | ICD-10-CM | POA: Diagnosis present

## 2018-07-23 DIAGNOSIS — T451X5A Adverse effect of antineoplastic and immunosuppressive drugs, initial encounter: Secondary | ICD-10-CM | POA: Diagnosis present

## 2018-07-23 DIAGNOSIS — Z793 Long term (current) use of hormonal contraceptives: Secondary | ICD-10-CM | POA: Diagnosis not present

## 2018-07-23 DIAGNOSIS — B9620 Unspecified Escherichia coli [E. coli] as the cause of diseases classified elsewhere: Secondary | ICD-10-CM | POA: Diagnosis present

## 2018-07-23 DIAGNOSIS — Z833 Family history of diabetes mellitus: Secondary | ICD-10-CM | POA: Diagnosis not present

## 2018-07-23 DIAGNOSIS — Z9049 Acquired absence of other specified parts of digestive tract: Secondary | ICD-10-CM | POA: Diagnosis not present

## 2018-07-23 DIAGNOSIS — D61810 Antineoplastic chemotherapy induced pancytopenia: Secondary | ICD-10-CM | POA: Diagnosis present

## 2018-07-23 DIAGNOSIS — I10 Essential (primary) hypertension: Secondary | ICD-10-CM | POA: Diagnosis present

## 2018-07-23 DIAGNOSIS — R000 Tachycardia, unspecified: Secondary | ICD-10-CM | POA: Diagnosis present

## 2018-07-23 DIAGNOSIS — Z1612 Extended spectrum beta lactamase (ESBL) resistance: Secondary | ICD-10-CM | POA: Diagnosis present

## 2018-07-23 DIAGNOSIS — R5081 Fever presenting with conditions classified elsewhere: Secondary | ICD-10-CM | POA: Diagnosis not present

## 2018-07-23 DIAGNOSIS — R17 Unspecified jaundice: Secondary | ICD-10-CM | POA: Diagnosis present

## 2018-07-23 DIAGNOSIS — Z79899 Other long term (current) drug therapy: Secondary | ICD-10-CM | POA: Diagnosis not present

## 2018-07-23 DIAGNOSIS — C9200 Acute myeloblastic leukemia, not having achieved remission: Secondary | ICD-10-CM | POA: Diagnosis not present

## 2018-07-23 DIAGNOSIS — R7881 Bacteremia: Secondary | ICD-10-CM | POA: Diagnosis present

## 2018-07-23 DIAGNOSIS — Z806 Family history of leukemia: Secondary | ICD-10-CM | POA: Diagnosis not present

## 2018-07-23 DIAGNOSIS — Z9221 Personal history of antineoplastic chemotherapy: Secondary | ICD-10-CM | POA: Diagnosis not present

## 2018-07-23 DIAGNOSIS — Z8249 Family history of ischemic heart disease and other diseases of the circulatory system: Secondary | ICD-10-CM | POA: Diagnosis not present

## 2018-07-23 DIAGNOSIS — E785 Hyperlipidemia, unspecified: Secondary | ICD-10-CM | POA: Diagnosis present

## 2018-07-23 LAB — URINALYSIS, ROUTINE W REFLEX MICROSCOPIC
Bilirubin Urine: NEGATIVE
Glucose, UA: NEGATIVE mg/dL
Hgb urine dipstick: NEGATIVE
Ketones, ur: NEGATIVE mg/dL
LEUKOCYTES UA: NEGATIVE
NITRITE: NEGATIVE
PH: 5 (ref 5.0–8.0)
Protein, ur: NEGATIVE mg/dL
Specific Gravity, Urine: 1.013 (ref 1.005–1.030)

## 2018-07-23 LAB — BASIC METABOLIC PANEL
ANION GAP: 3 — AB (ref 5–15)
BUN: 14 mg/dL (ref 6–20)
CALCIUM: 8.3 mg/dL — AB (ref 8.9–10.3)
CO2: 27 mmol/L (ref 22–32)
Chloride: 106 mmol/L (ref 98–111)
Creatinine, Ser: 0.9 mg/dL (ref 0.61–1.24)
GFR calc Af Amer: >60 mL/min (ref >60)
GLUCOSE: 118 mg/dL — AB (ref 70–99)
POTASSIUM: 3.4 mmol/L — AB (ref 3.5–5.1)
SODIUM: 136 mmol/L (ref 135–145)

## 2018-07-23 LAB — BLOOD CULTURE ID PANEL (REFLEXED)
Acinetobacter baumannii: NOT DETECTED
CANDIDA KRUSEI: NOT DETECTED
CARBAPENEM RESISTANCE: NOT DETECTED
Candida albicans: NOT DETECTED
Candida glabrata: NOT DETECTED
Candida parapsilosis: NOT DETECTED
Candida tropicalis: NOT DETECTED
ENTEROBACTER CLOACAE COMPLEX: NOT DETECTED
ENTEROBACTERIACEAE SPECIES: DETECTED — AB
ENTEROCOCCUS SPECIES: NOT DETECTED
Escherichia coli: DETECTED — AB
Haemophilus influenzae: NOT DETECTED
KLEBSIELLA OXYTOCA: NOT DETECTED
Klebsiella pneumoniae: NOT DETECTED
LISTERIA MONOCYTOGENES: NOT DETECTED
Neisseria meningitidis: NOT DETECTED
PSEUDOMONAS AERUGINOSA: NOT DETECTED
Proteus species: NOT DETECTED
STAPHYLOCOCCUS AUREUS BCID: NOT DETECTED
STREPTOCOCCUS PNEUMONIAE: NOT DETECTED
STREPTOCOCCUS PYOGENES: NOT DETECTED
STREPTOCOCCUS SPECIES: NOT DETECTED
Serratia marcescens: NOT DETECTED
Staphylococcus species: NOT DETECTED
Streptococcus agalactiae: NOT DETECTED

## 2018-07-23 LAB — CBC WITH DIFFERENTIAL/PLATELET
HCT: 20.5 % — ABNORMAL LOW (ref 39.0–52.0)
HCT: 21.9 % — ABNORMAL LOW (ref 39.0–52.0)
Hemoglobin: 6.9 g/dL — CL (ref 13.0–17.0)
Hemoglobin: 7.3 g/dL — ABNORMAL LOW (ref 13.0–17.0)
MCH: 31.8 pg (ref 26.0–34.0)
MCH: 31.9 pg (ref 26.0–34.0)
MCHC: 33.3 g/dL (ref 30.0–36.0)
MCHC: 33.7 g/dL (ref 30.0–36.0)
MCV: 94.5 fL (ref 78.0–100.0)
MCV: 95.6 fL (ref 78.0–100.0)
PLATELETS: 27 10*3/uL — AB (ref 150–400)
Platelets: 20 10*3/uL — CL (ref 150–400)
RBC: 2.17 MIL/uL — ABNORMAL LOW (ref 4.22–5.81)
RBC: 2.29 MIL/uL — AB (ref 4.22–5.81)
RDW: 15.7 % — AB (ref 11.5–15.5)
RDW: 15.9 % — AB (ref 11.5–15.5)
WBC: 0.1 10*3/uL — CL (ref 4.0–10.5)
WBC: <0.1 10*3/uL — CL (ref 4.0–10.5)

## 2018-07-23 LAB — COMPREHENSIVE METABOLIC PANEL
ALBUMIN: 3.6 g/dL (ref 3.5–5.0)
ALK PHOS: 56 U/L (ref 38–126)
ALT: 25 U/L (ref 0–44)
ANION GAP: 8 (ref 5–15)
AST: 21 U/L (ref 15–41)
BILIRUBIN TOTAL: 1.7 mg/dL — AB (ref 0.3–1.2)
BUN: 18 mg/dL (ref 6–20)
CALCIUM: 9.1 mg/dL (ref 8.9–10.3)
CO2: 24 mmol/L (ref 22–32)
Chloride: 105 mmol/L (ref 98–111)
Creatinine, Ser: 1.02 mg/dL (ref 0.61–1.24)
GFR calc non Af Amer: >60 mL/min (ref >60)
GLUCOSE: 124 mg/dL — AB (ref 70–99)
Potassium: 3.5 mmol/L (ref 3.5–5.1)
Sodium: 137 mmol/L (ref 135–145)
TOTAL PROTEIN: 7 g/dL (ref 6.5–8.1)

## 2018-07-23 LAB — PREPARE RBC (CROSSMATCH)

## 2018-07-23 LAB — HEPATIC FUNCTION PANEL
ALT: 21 U/L (ref 0–44)
AST: 17 U/L (ref 15–41)
Albumin: 3.1 g/dL — ABNORMAL LOW (ref 3.5–5.0)
Alkaline Phosphatase: 48 U/L (ref 38–126)
BILIRUBIN DIRECT: 0.1 mg/dL (ref 0.0–0.2)
BILIRUBIN INDIRECT: 1 mg/dL — AB (ref 0.3–0.9)
TOTAL PROTEIN: 6.4 g/dL — AB (ref 6.5–8.1)
Total Bilirubin: 1.1 mg/dL (ref 0.3–1.2)

## 2018-07-23 LAB — I-STAT CG4 LACTIC ACID, ED: Lactic Acid, Venous: 1.08 mmol/L (ref 0.5–1.9)

## 2018-07-23 LAB — MAGNESIUM: Magnesium: 1.5 mg/dL — ABNORMAL LOW (ref 1.7–2.4)

## 2018-07-23 LAB — PHOSPHORUS: PHOSPHORUS: 3.6 mg/dL (ref 2.5–4.6)

## 2018-07-23 LAB — PROTIME-INR
INR: 1.28
Prothrombin Time: 15.9 seconds — ABNORMAL HIGH (ref 11.4–15.2)

## 2018-07-23 LAB — MRSA PCR SCREENING: MRSA BY PCR: NEGATIVE

## 2018-07-23 MED ORDER — ACETAMINOPHEN 325 MG PO TABS
650.0000 mg | ORAL_TABLET | Freq: Four times a day (QID) | ORAL | Status: DC | PRN
  Administered 2018-07-23 – 2018-07-27 (×10): 650 mg via ORAL
  Filled 2018-07-23 (×10): qty 2

## 2018-07-23 MED ORDER — MAGNESIUM SULFATE 4 GM/100ML IV SOLN
4.0000 g | Freq: Once | INTRAVENOUS | Status: AC
  Administered 2018-07-23: 4 g via INTRAVENOUS
  Filled 2018-07-23: qty 100

## 2018-07-23 MED ORDER — ACETAMINOPHEN 325 MG PO TABS
325.0000 mg | ORAL_TABLET | Freq: Once | ORAL | Status: AC
  Administered 2018-07-23: 325 mg via ORAL
  Filled 2018-07-23: qty 1

## 2018-07-23 MED ORDER — ACETAMINOPHEN 650 MG RE SUPP: 650.0000 mg | Freq: Four times a day (QID) | RECTAL | Status: DC | PRN

## 2018-07-23 MED ORDER — ACYCLOVIR 800 MG PO TABS
400.0000 mg | ORAL_TABLET | Freq: Two times a day (BID) | ORAL | Status: DC
  Administered 2018-07-23 – 2018-07-24 (×3): 400 mg via ORAL
  Filled 2018-07-23 (×3): qty 1

## 2018-07-23 MED ORDER — VANCOMYCIN HCL IN DEXTROSE 1-5 GM/200ML-% IV SOLN
1000.0000 mg | Freq: Once | INTRAVENOUS | Status: AC
  Administered 2018-07-23: 1000 mg via INTRAVENOUS
  Filled 2018-07-23: qty 200

## 2018-07-23 MED ORDER — METRONIDAZOLE IN NACL 5-0.79 MG/ML-% IV SOLN
500.0000 mg | Freq: Three times a day (TID) | INTRAVENOUS | Status: DC
  Administered 2018-07-23 – 2018-07-24 (×5): 500 mg via INTRAVENOUS
  Filled 2018-07-23 (×5): qty 100

## 2018-07-23 MED ORDER — LEVOFLOXACIN 500 MG PO TABS
500.0000 mg | ORAL_TABLET | Freq: Every day | ORAL | Status: DC
  Administered 2018-07-23: 500 mg via ORAL
  Filled 2018-07-23: qty 1

## 2018-07-23 MED ORDER — SODIUM CHLORIDE 0.9 % IV BOLUS (SEPSIS)
2000.0000 mL | Freq: Once | INTRAVENOUS | Status: AC
  Administered 2018-07-23: 1000 mL via INTRAVENOUS

## 2018-07-23 MED ORDER — TBO-FILGRASTIM 480 MCG/0.8ML ~~LOC~~ SOSY: 480.0000 ug | PREFILLED_SYRINGE | Freq: Every day | SUBCUTANEOUS | Status: DC

## 2018-07-23 MED ORDER — POTASSIUM CHLORIDE CRYS ER 20 MEQ PO TBCR
20.0000 meq | EXTENDED_RELEASE_TABLET | Freq: Once | ORAL | Status: AC
  Administered 2018-07-23: 20 meq via ORAL
  Filled 2018-07-23: qty 1

## 2018-07-23 MED ORDER — SODIUM CHLORIDE 0.9 % IV SOLN
INTRAVENOUS | Status: DC
  Administered 2018-07-23: 1 mL via INTRAVENOUS
  Administered 2018-07-24 – 2018-07-26 (×8): via INTRAVENOUS
  Administered 2018-07-27: 125 mL/h via INTRAVENOUS
  Administered 2018-07-28 – 2018-07-29 (×2): via INTRAVENOUS

## 2018-07-23 MED ORDER — ONDANSETRON HCL 4 MG/2ML IJ SOLN: 4.0000 mg | Freq: Four times a day (QID) | INTRAMUSCULAR | Status: DC | PRN

## 2018-07-23 MED ORDER — ACETAMINOPHEN 325 MG PO TABS: 650.0000 mg | ORAL_TABLET | Freq: Once | ORAL | Status: DC

## 2018-07-23 MED ORDER — TBO-FILGRASTIM 480 MCG/0.8ML ~~LOC~~ SOSY
480.0000 ug | PREFILLED_SYRINGE | Freq: Every day | SUBCUTANEOUS | Status: DC
  Filled 2018-07-23 (×2): qty 0.8

## 2018-07-23 MED ORDER — SODIUM CHLORIDE 0.9 % IV SOLN
2.0000 g | Freq: Once | INTRAVENOUS | Status: AC
  Administered 2018-07-23: 2 g via INTRAVENOUS
  Filled 2018-07-23: qty 2

## 2018-07-23 MED ORDER — SODIUM CHLORIDE 0.9% IV SOLUTION: Freq: Once | INTRAVENOUS | Status: DC

## 2018-07-23 MED ORDER — ONDANSETRON HCL 4 MG PO TABS: 4.0000 mg | ORAL_TABLET | Freq: Four times a day (QID) | ORAL | Status: DC | PRN

## 2018-07-23 MED ORDER — SODIUM CHLORIDE 0.9 % IV BOLUS
1000.0000 mL | Freq: Once | INTRAVENOUS | Status: AC
  Administered 2018-07-23: 1000 mL via INTRAVENOUS

## 2018-07-23 MED ORDER — KETOROLAC TROMETHAMINE 15 MG/ML IJ SOLN
15.0000 mg | Freq: Once | INTRAMUSCULAR | Status: AC
  Administered 2018-07-23: 15 mg via INTRAVENOUS
  Filled 2018-07-23: qty 1

## 2018-07-23 MED ORDER — FLUCONAZOLE 100 MG PO TABS
200.0000 mg | ORAL_TABLET | Freq: Every day | ORAL | Status: DC
  Administered 2018-07-23 – 2018-07-30 (×8): 200 mg via ORAL
  Filled 2018-07-23 (×8): qty 2

## 2018-07-23 MED ORDER — SODIUM CHLORIDE 0.9 % IV SOLN
2.0000 g | Freq: Three times a day (TID) | INTRAVENOUS | Status: DC
  Administered 2018-07-23 – 2018-07-24 (×3): 2 g via INTRAVENOUS
  Filled 2018-07-23 (×4): qty 2

## 2018-07-23 MED ORDER — PANTOPRAZOLE SODIUM 40 MG PO TBEC
40.0000 mg | DELAYED_RELEASE_TABLET | Freq: Every day | ORAL | Status: DC
  Administered 2018-07-23 – 2018-07-30 (×8): 40 mg via ORAL
  Filled 2018-07-23 (×8): qty 1

## 2018-07-23 MED ORDER — VANCOMYCIN HCL 10 G IV SOLR
1250.0000 mg | Freq: Two times a day (BID) | INTRAVENOUS | Status: DC
  Administered 2018-07-23 (×2): 1250 mg via INTRAVENOUS
  Filled 2018-07-23 (×3): qty 1250

## 2018-07-23 NOTE — Progress Notes (Signed)
CRITICAL VALUE ALERT  Critical Value:  WBC 0.1, HGB 6.9, Platelets 20   Date & Time Notied:  0920 on 07/23/18  Provider Notified: Jerilee Hoh  Orders Received/Actions taken: MD made aware and will adjust orders, consult to hematology made.   Ericka Pontiff, RN 9:42 AM 07/23/18

## 2018-07-23 NOTE — Progress Notes (Signed)
Initial Nutrition Assessment  DOCUMENTATION CODES:   Not applicable  INTERVENTION:   - Magic cup TID with meals, each supplement provides 290 kcal and 9 grams of protein  - Provided education regarding taste changes in cancer treatment  NUTRITION DIAGNOSIS:   Unintentional weight loss related to cancer and cancer related treatments, decreased appetite as evidenced by per patient/family report, percent weight loss.  GOAL:   Patient will meet greater than or equal to 90% of their needs  MONITOR:   PO intake, Supplement acceptance, Labs, Weight trends  REASON FOR ASSESSMENT:   Malnutrition Screening Tool    ASSESSMENT:   52 year old male who presented to the ED on 9/8 with a fever. PMH significant for hyperlipidemia, hypertension, and acute myelogenous leukemia (recently completed fourth course of chemotherapy with high-dose cytarabine at [facility]). Pt has had to be admitted to the hospital following pervious courses of chemotherapy because of neutropenic fever.  Spoke with pt at bedside. Pt reports that his appetite is currently poor and that this is usually case for the first week following a chemo treatment. During this time, pt may eat only 1 meal daily that includes a bowl of cereal.  Pt states that in between chemo treatments, he has a large appetite and eats "at least 3 meals" and snacks daily. Pt reports that a typical meal includes 3 pork chops, pinto beans, cornbread, and mac and cheese.  Pt endorses weight loss when he was first diagnosed in March. Pt reports that his UBW prior to weight loss was 230 lbs and that his lowest weight as 210 lbs. Pt states his weight is "creeping back up."  Per weight history in chart, pt has lost 4.6 lbs over the past 6 months. This is a 2.1% weight loss which is not significant for timeframe.  Pt is at risk for developing malnutrition if poor PO intake persists and pt continues to lose weight.  Pt endorses  taste changes during chemotherapy treatment. RD provided handout and education regarding these side effects.  Medications reviewed and include: 40 mg Protonix daily, IV antibiotics  Labs reviewed: magnesium 1.5 (L), WBC <0.1 (L), hemoglobin 7.3 (L), HCT 21.9 (L), platelets 27 (L)  UOP: 2500 ml x 24 hours  NUTRITION - FOCUSED PHYSICAL EXAM:    Most Recent Value  Orbital Region  No depletion  Upper Arm Region  Mild depletion  Thoracic and Lumbar Region  Mild depletion  Buccal Region  No depletion  Temple Region  No depletion  Clavicle Bone Region  No depletion  Clavicle and Acromion Bone Region  No depletion  Scapular Bone Region  No depletion  Dorsal Hand  No depletion  Patellar Region  Mild depletion  Anterior Thigh Region  Mild depletion  Posterior Calf Region  Mild depletion  Edema (RD Assessment)  None  Hair  Reviewed  Eyes  Reviewed  Mouth  Reviewed  Skin  Reviewed  Nails  Reviewed       Diet Order:   Diet Order            Diet Heart Room service appropriate? Yes; Fluid consistency: Thin  Diet effective now              EDUCATION NEEDS:   Education needs have been addressed  Skin:  Skin Assessment: Reviewed RN Assessment  Last BM:  07/22/18  Height:   Ht Readings from Last 1 Encounters:  07/22/18 6' 1"  (1.854 m)    Weight:   Wt  Readings from Last 1 Encounters:  07/23/18 95.9 kg    Ideal Body Weight:  83.64 kg  BMI:  Body mass index is 27.89 kg/m.  Estimated Nutritional Needs:   Kcal:  6468-0321  Protein:  115-130 grams  Fluid:  >/= 2.3 L    [name], MS, RD, LDN Pager: [phone] Weekend/After Hours: [phone]

## 2018-07-23 NOTE — ED Provider Notes (Signed)
[facility] EMERGENCY DEPARTMENT Provider Note   CSN: [id number] Arrival date & time: 07/22/18  2338     History   Chief Complaint Chief Complaint  Patient presents with  . Fever    HPI Bryce Hamilton is a 52 y.o. male.  The history is provided by the patient.  He has history of hyperlipidemia and acute myelogenous leukemia and completed his fourth course of chemotherapy with high-dose cytarabine, just discharged from the hospital 3 days ago.  Tonight he came in with fever to 103.6 with associated chills.  He denies sweats.  He denies rhinorrhea, sore throat, cough, nausea, vomiting, diarrhea.  He denies any dysuria or urinary urgency or frequency.  Past Medical History:  Diagnosis Date  . Hyperlipidemia   . Leukemia [facility])     Patient Active Problem List   Diagnosis Date Noted  . 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 CAP (community acquired pneumonia) 01/21/2018  . Thrombocytopenia ([place]) 01/21/2018  . Anemia 01/21/2018  . Leukocytosis 01/21/2018  . Generalized weakness 01/21/2018  . Hemolytic anemia ([place]) 01/21/2018  . Special screening for malignant neoplasms, colon   . Hyperlipidemia 09/27/2015    Past Surgical History:  Procedure Laterality Date  . APPENDECTOMY     has had scar tissue removed in that area since appendectomy  . CHOLECYSTECTOMY    . COLONOSCOPY N/A 02/10/2016   Procedure: COLONOSCOPY;  Surgeon: Danie Binder, MD;  Location: [facility] ENDO SUITE;  Service: Endoscopy;  Laterality: N/A;  1:45 PM        Home Medications    Prior to Admission medications   Medication Sig Start Date End Date Taking? Authorizing Provider  acyclovir (ZOVIRAX) 400 MG tablet Take 1 tablet (400 mg total) by mouth 2 times daily while neutropenic. Your physician will instruct you when to START and STOP taking. **Please start 04/24/18** 04/23/18   [provider]  amLODipine (NORVASC) 5 MG tablet  02/28/18   [provider]    finasteride (PROSCAR) 5 MG tablet Take by mouth. 02/28/18   [provider]  fluconazole (DIFLUCAN) 200 MG tablet Take 1 tablet (200 mg total) by mouth daily while neutropenic. Your physician will instruct you when to START and STOP taking. **Please start 04/24/18** 04/23/18   [provider]  hydrOXYzine (ATARAX/VISTARIL) 25 MG tablet Take by mouth. 02/28/18   [provider]  levofloxacin (LEVAQUIN) 500 MG tablet Take 1 tablet (500 mg total) by mouth daily while neutropenic. Your physician will instruct you when to START and STOP taking. **Please start 04/24/18** 04/23/18   [provider]  pantoprazole (PROTONIX) 40 MG tablet Take by mouth. 02/28/18   [provider]  polyethylene glycol (MIRALAX / GLYCOLAX) packet Take 17 g by mouth. 02/28/18   [provider]  SM SENNA-S 8.6-50 MG tablet  02/28/18   [provider]    Family History Family History  Problem Relation Age of Onset  . Hypertension Mother   . Diabetes Father   . Leukemia Father     Social History Social History   Tobacco Use  . Smoking status: Never Smoker  . Smokeless tobacco: Never Used  Substance Use Topics  . Alcohol use: No  . Drug use: No     Allergies   Patient has no known allergies.   Review of Systems Review of Systems  All other systems reviewed and are negative.    Physical Exam Updated Vital Signs BP (!) 169/107  Pulse (!) 120   Temp (!) 102.9 F (39.4 C) (Oral)   Resp 20   Ht 6\' 1"  (1.854 m)   Wt 95.3 kg   SpO2 98%   BMI 27.71 kg/m   Physical Exam  Nursing note and vitals reviewed.  52 year old male, resting comfortably and in no acute distress. Vital signs are significant for fever, rapid heart rate, elevated blood pressure. Oxygen saturation is 98%, which is normal. Head is normocephalic and atraumatic. PERRLA, EOMI. Oropharynx is clear. Neck is nontender and supple without adenopathy or JVD. Back is nontender and  there is no CVA tenderness. Lungs are clear without rales, wheezes, or rhonchi. Chest is nontender.  Mediport present on the right side. Heart has regular rate and rhythm without murmur. Abdomen is soft, flat, nontender without masses or hepatosplenomegaly and peristalsis is normoactive. Extremities have no cyanosis or edema, full range of motion is present. Skin is warm and dry without rash. Neurologic: Mental status is normal, cranial nerves are intact, there are no motor or sensory deficits.  ED Treatments / Results  Labs (all labs ordered are listed, but only abnormal results are displayed) Labs Reviewed  COMPREHENSIVE METABOLIC PANEL - Abnormal; Notable for the following components:      Result Value   Glucose, Bld 124 (*)    Total Bilirubin 1.7 (*)    All other components within normal limits  CBC WITH DIFFERENTIAL/PLATELET - Abnormal; Notable for the following components:   WBC <0.1 (*)    RBC 2.29 (*)    Hemoglobin 7.3 (*)    HCT 21.9 (*)    RDW 15.9 (*)    Platelets 27 (*)    All other components within normal limits  PROTIME-INR - Abnormal; Notable for the following components:   Prothrombin Time 15.9 (*)    All other components within normal limits  CULTURE, BLOOD (ROUTINE X 2)  CULTURE, BLOOD (ROUTINE X 2)  URINALYSIS, ROUTINE W REFLEX MICROSCOPIC  I-STAT CG4 LACTIC ACID, ED    EKG EKG Interpretation  Date/Time:  Monday July 23 2018 01:07:10 EDT Ventricular Rate:  119 PR Interval:    QRS Duration: 85 QT Interval:  316 QTC Calculation: 445 R Axis:   -5 Text Interpretation:  Sinus tachycardia Otherwise within normal limits When compared with ECG of 01/30/2018, No significant change was found Confirmed by Delora Fuel ([id number]) on 07/23/2018 1:15:46 AM   Radiology Dg Chest 2 View  Result Date: 07/23/2018 CLINICAL DATA:  Fever starting tonight, finished chemotherapy last week, history leukemia EXAM: CHEST - 2 VIEW COMPARISON:  01/21/2018 FINDINGS: RIGHT  jugular Port-A-Cath with tip projecting over SVC near cavoatrial junction. Normal heart size, mediastinal contours, and pulmonary vascularity. Lungs clear. No pleural effusion or pneumothorax. Bones unremarkable. IMPRESSION: No acute abnormalities. Electronically Signed   By: Lavonia Dana M.D.   On: 07/23/2018 01:07    Procedures Procedures  CRITICAL CARE Performed by: Delora Fuel Total critical care time: 50 minutes Critical care time was exclusive of separately billable procedures and treating other patients. Critical care was necessary to treat or prevent imminent or life-threatening deterioration. Critical care was time spent personally by me on the following activities: development of treatment plan with patient and/or surrogate as well as nursing, discussions with consultants, evaluation of patient's response to treatment, examination of patient, obtaining history from patient or surrogate, ordering and performing treatments and interventions, ordering and review of laboratory studies, ordering and review of radiographic studies, pulse oximetry and re-evaluation of patient's  condition.  Medications Ordered in ED Medications  acetaminophen (TYLENOL) tablet 650 mg (650 mg Oral Given 07/23/18 0003)     Initial Impression / Assessment and Plan / ED Course  I have reviewed the triage vital signs and the nursing notes.  Pertinent labs & imaging results that were available during my care of the patient were reviewed by me and considered in my medical decision making (see chart for details).  Fever and patient getting cancer chemotherapy.  Old records are reviewed, and he had been admitted to [facility] on August 30 for his fourth course of high-dose cytarabine.  He has had to be admitted following his previous 2 courses of chemotherapy because of fever.  Patient started on sepsis pathway.  No source of infection obvious on physical exam.  1:13 AM Labs have come back with  WBC less than 0.1.  Also, platelets are 27, hemoglobin 7.3.  Without active bleeding, no indication for platelet transfusion.  Lactic acid level is normal.  He is started on antibiotics for neutropenic fever.  I discussed with patient whether he wanted to go to [facility] since that is where he gets his chemotherapy, but he states he would prefer to be admitted here.  Case is discussed with Dr. Olevia Bowens, of [facility], who agrees to admit the patient.  Final Clinical Impressions(s) / ED Diagnoses   Final diagnoses:  None    ED Discharge Orders    None       Delora Fuel, MD 07/17/00 [phone]

## 2018-07-23 NOTE — ED Notes (Signed)
Dr Olevia Bowens at bedside, aware of vitals,

## 2018-07-23 NOTE — Progress Notes (Signed)
CRITICAL VALUE ALERT  Critical Value:  Blood Cultures + for gram negative rods  Date & Time Notied:  07/23/18 at 1320  Provider Notified: Jerilee Hoh  Orders Received/Actions taken:

## 2018-07-23 NOTE — ED Notes (Signed)
Date and time results received: 07/23/18 00:58  Test: wbc <0.1, platelets 27 Critical Value: wbc <0.1, platelets 27  Name of Provider Notified: Dr Roxanne Mins   Orders Received? Or Actions Taken?: no additional orders given

## 2018-07-23 NOTE — Progress Notes (Signed)
Pharmacy Antibiotic Note  Bryce Hamilton is a 52 y.o. male admitted on 07/22/2018 with febrile neutropenia.  Pharmacy has been consulted for Vancomycin and Cefepime dosing.  Plan: Vancomycin 2000 mg IV x 1 dose Vancomycin 1250 mg IV every 12 hours.  Goal trough 15-20 mcg/mL.  Cefepime 2000 mg IV every 8 hours Metronidazole 500 mg IV every 8 hours Monitor labs, c/s, and vanco trough as indicated  Height: 6\' 1"  (185.4 cm) Weight: 211 lb 6.7 oz (95.9 kg) IBW/kg (Calculated) : 79.9  Temp (24hrs), Avg:102.9 F (39.4 C), Min:102.2 F (39 C), Max:103.2 F (39.6 C)  Recent Labs  Lab 07/19/18 1141 07/23/18 0014 07/23/18 0030 07/23/18 0853  WBC 4.1 <0.1*  --  0.1*  CREATININE 0.85 1.02  --  0.90  LATICACIDVEN  --   --  1.08  --     Estimated Creatinine Clearance: 115.9 mL/min (by C-G formula based on SCr of 0.9 mg/dL).    No Known Allergies  Antimicrobials this admission: Vanco 9/9 >>  Cefepime 9/9 >>  Flagyl 9/9 >>  Dose adjustments this admission: N/A  Microbiology results: 9/9 BCx: pending 9/9 MRSA PCR: negative  Thank you for allowing pharmacy to be a part of this patient's care.  Ramond Craver 07/23/2018 10:12 AM

## 2018-07-23 NOTE — Progress Notes (Signed)
Patient seen and examined, database reviewed.  No family members at bedside.  Patient admitted earlier today for febrile neutropenia.  He was diagnosed with AML in March and states that every time he gets treated he has febrile neutropenia.  His treatments are at [facility].  His last chemo treatment was over Labor Day weekend.  He had a temperature as high as 103, highest here is 102.7, still febrile as of early this morning.  CBC this morning shows a WBC count of 0.1 with an ANC of 0, hemoglobin of 6.9 and a platelet count of 20.  We will transfuse 2 units of PRBCs, since no active bleeding will hold on platelet transfusion.  He is chronically on Levaquin, acyclovir and fluconazole.  We will continue acyclovir and fluconazole, agree with broad-spectrum antibiotic therapy with vancomycin and cefepime for now.  Blood and urine cultures are pending, no obvious sign of infection.  Oncology consultation has been requested to opine on feasibility of G-CSF in this patient who is being treated for AML.  Domingo Mend, MD [facility] Pager: [phone]

## 2018-07-23 NOTE — Progress Notes (Signed)
Diagnosis Neutropenic fever ([place])  Pancytopenia ([place])  Staging Cancer Staging No matching staging information was found for the patient.  Subjective:  Pt reports recent visit at [facility].  He was last treated with growth factor on 07/17/2018.    Objective: Vitals Patient Vitals for the past 24 hrs:  BP Temp Temp src Pulse Resp SpO2 Height Weight  07/23/18 1500 - - - 85 20 100 % - -  07/23/18 1400 - - - 86 (!) 7 100 % - -  07/23/18 1300 - - - 90 (!) 22 100 % - -  07/23/18 1200 - - - 86 (!) 21 100 % - -  07/23/18 1140 - 97.6 F (36.4 C) Core 88 (!) 27 100 % - -  07/23/18 1100 - - - 78 18 100 % - -  07/23/18 1000 - - - 86 19 100 % - -  07/23/18 0900 127/77 - - (!) 102 20 99 % - -  07/23/18 0800 134/85 - - (!) 131 16 99 % - -  07/23/18 0726 - (!) 102.2 F (39 C) Oral (!) 127 (!) 29 97 % - -  07/23/18 0700 109/63 - - (!) 121 (!) 30 99 % - -  07/23/18 0635 - - - (!) 147 (!) 27 99 % - -  07/23/18 0600 124/77 - - (!) 116 (!) 31 100 % - -  07/23/18 0500 113/60 - - (!) 127 (!) 32 97 % - -  07/23/18 0400 - - - (!) 123 (!) 32 100 % - -  07/23/18 0322 131/87 - - (!) 117 (!) 34 - - 211 lb 6.7 oz (95.9 kg)  07/23/18 0245 - (!) 103.2 F (39.6 C) Oral - - - - -  07/23/18 0200 - (!) 103.1 F (39.5 C) Oral - - - - -  07/23/18 0130 (!) 146/93 - - (!) 118 (!) 30 97 % - -  07/23/18 0100 (!) 151/97 - - (!) 118 20 100 % - -  07/23/18 0058 - (!) 103 F (39.4 C) Oral - - - - -  07/23/18 0030 (!) 165/97 - - (!) 119 - 97 % - -  07/23/18 0000 (!) 153/96 - - (!) 121 - 99 % - -  07/22/18 2349 (!) 169/107 (!) 102.9 F (39.4 C) Oral (!) 120 20 98 % - -  07/22/18 2348 - - - - - - '6\' 1"'  (1.854 m) 210 lb (95.3 kg)    Physical Exam Constitutional: Well-developed, well-nourished, and in no distress.   HENT: Head: Normocephalic and atraumatic.  Mouth/Throat: No oropharyngeal exudate. Mucosa moist. Eyes: Pupils are equal, round, and reactive to light. Conjunctivae are normal. No scleral icterus.  Neck:  Normal range of motion. Neck supple. No JVD present.  Cardiovascular: Normal rate, regular rhythm and normal heart sounds.  Exam reveals no gallop and no friction rub.   No murmur heard. Pulmonary/Chest: Effort normal and breath sounds normal. No respiratory distress. No wheezes.No rales.  Abdominal: Soft. Bowel sounds are normal. No distension. There is no tenderness. There is no guarding.  Musculoskeletal: No edema or tenderness.  Lymphadenopathy: No cervical, axillary or supraclavicular adenopathy.  Neurological: Alert and oriented to person, place, and time. No cranial nerve deficit.  Skin: Skin is warm and dry. No rash noted. No erythema. No pallor.  Psychiatric: Affect and judgment normal.    Medications:  Reviewed.  Pt on Cefepime/Vanc   Allergies Patient has no known allergies.  Review of Systems Review of  Systems - Oncology ROS as per HPI otherwise 12 point ROS is negative.   Labs Admission on 07/22/2018  Component Date Value Ref Range Status  . Sodium 07/23/2018 137  135 - 145 mmol/L Final  . Potassium 07/23/2018 3.5  3.5 - 5.1 mmol/L Final  . Chloride 07/23/2018 105  98 - 111 mmol/L Final  . CO2 07/23/2018 24  22 - 32 mmol/L Final  . Glucose, Bld 07/23/2018 124* 70 - 99 mg/dL Final  . BUN 07/23/2018 18  6 - 20 mg/dL Final  . Creatinine, Ser 07/23/2018 1.02  0.61 - 1.24 mg/dL Final  . Calcium 07/23/2018 9.1  8.9 - 10.3 mg/dL Final  . Total Protein 07/23/2018 7.0  6.5 - 8.1 g/dL Final  . Albumin 07/23/2018 3.6  3.5 - 5.0 g/dL Final  . AST 07/23/2018 21  15 - 41 U/L Final  . ALT 07/23/2018 25  0 - 44 U/L Final  . Alkaline Phosphatase 07/23/2018 56  38 - 126 U/L Final  . Total Bilirubin 07/23/2018 1.7* 0.3 - 1.2 mg/dL Final  . GFR calc non Af Amer 07/23/2018 >60  >60 mL/min Final  . GFR calc Af Amer 07/23/2018 >60  >60 mL/min Final   Comment: (NOTE) The eGFR has been calculated using the CKD EPI equation. This calculation has not been validated in all clinical  situations. eGFR's persistently <60 mL/min signify possible Chronic Kidney Disease.   Georgiann Hahn gap 07/23/2018 8  5 - 15 Final   Performed at [facility], [address] [place], [place] [address]  . Lactic Acid, Venous 07/23/2018 1.08  0.5 - 1.9 mmol/L Final  . WBC 07/23/2018 <0.1* 4.0 - 10.5 K/uL Final   Comment: RESULT REPEATED AND VERIFIED SMEAR STAINED AND AVAILABLE FOR REVIEW CRITICAL RESULT CALLED TO, READ BACK BY AND VERIFIED WITH: POINDEXTER,M @ 0048 ON 07/23/18 BY JUW   . RBC 07/23/2018 2.29* 4.22 - 5.81 MIL/uL Final  . Hemoglobin 07/23/2018 7.3* 13.0 - 17.0 g/dL Final  . HCT 07/23/2018 21.9* 39.0 - 52.0 % Final  . MCV 07/23/2018 95.6  78.0 - 100.0 fL Final  . MCH 07/23/2018 31.9  26.0 - 34.0 pg Final  . MCHC 07/23/2018 33.3  30.0 - 36.0 g/dL Final  . RDW 07/23/2018 15.9* 11.5 - 15.5 % Final  . Platelets 07/23/2018 27* 150 - 400 K/uL Final   Comment: RESULT REPEATED AND VERIFIED CRITICAL RESULT CALLED TO, READ BACK BY AND VERIFIED WITH: POINDEXTER,M @ 0048 ON 07/23/18 BY JUW   . Neutrophils Relative % 07/23/2018 TOO FEW TO COUNT, SMEAR AVAILABLE FOR REVIEW  % Final   Performed at [facility], [address] [place], [place] [address]  . Prothrombin Time 07/23/2018 15.9* 11.4 - 15.2 seconds Final  . INR 07/23/2018 1.28   Final   Performed at [facility], [address] [place], [place] [address]  . Specimen Description 07/23/2018 PORTA CATH   Final  . Special Requests 07/23/2018 BOTTLES DRAWN AEROBIC AND ANAEROBIC Blood Culture adequate volume   Final  . Culture  Setup Time 07/23/2018    Final                   Value:GRAM NEGATIVE RODS BOTH BOTTLES POSITIVE Gram Stain Report Called to,Read Back By and Verified With: BARBARA MORRIS 07/23/18 @ 1320 LLS Performed at [facility], [address] [place], [place] [address]   . Culture 07/23/2018 PENDING   Incomplete  . Report Status 07/23/2018 PENDING   Incomplete  .  Specimen Description 07/23/2018 BLOOD LEFT HAND   Final  .  Special Requests 07/23/2018 BOTTLES DRAWN AEROBIC AND ANAEROBIC Blood Culture adequate volume   Final  . Culture  Setup Time 07/23/2018    Final                   Value:GRAM NEGATIVE RODS BOTH BOTTES OF SET Gram Stain Report Called to,Read Back By and Verified With: B MORRIS 07/23/18 @ 1320 LLS Performed at [facility], [address] [place], [place] [address]   . Culture 07/23/2018 PENDING   Incomplete  . Report Status 07/23/2018 PENDING   Incomplete  . Color, Urine 07/23/2018 STRAW* YELLOW Final  . APPearance 07/23/2018 CLEAR  CLEAR Final  . Specific Gravity, Urine 07/23/2018 1.013  1.005 - 1.030 Final  . pH 07/23/2018 5.0  5.0 - 8.0 Final  . Glucose, UA 07/23/2018 NEGATIVE  NEGATIVE mg/dL Final  . Hgb urine dipstick 07/23/2018 NEGATIVE  NEGATIVE Final  . Bilirubin Urine 07/23/2018 NEGATIVE  NEGATIVE Final  . Ketones, ur 07/23/2018 NEGATIVE  NEGATIVE mg/dL Final  . Protein, ur 07/23/2018 NEGATIVE  NEGATIVE mg/dL Final  . Nitrite 07/23/2018 NEGATIVE  NEGATIVE Final  . Leukocytes, UA 07/23/2018 NEGATIVE  NEGATIVE Final   Performed at [facility], [address] [place], [place] [address]  . WBC 07/23/2018 0.1* 4.0 - 10.5 K/uL Final   Comment: REPEATED TO VERIFY CRITICAL RESULT CALLED TO, READ BACK BY AND VERIFIED WITH: ROBERTS, T'@0921'  BY MATTHEWS, B 9.9.19   . RBC 07/23/2018 2.17* 4.22 - 5.81 MIL/uL Final  . Hemoglobin 07/23/2018 6.9* 13.0 - 17.0 g/dL Final   Comment: REPEATED TO VERIFY CRITICAL RESULT CALLED TO, READ BACK BY AND VERIFIED WITH: ROBERTS, T'@0921'  BY MATTHEWS, B 9.9.19   . HCT 07/23/2018 20.5* 39.0 - 52.0 % Final  . MCV 07/23/2018 94.5  78.0 - 100.0 fL Final  . MCH 07/23/2018 31.8  26.0 - 34.0 pg Final  . MCHC 07/23/2018 33.7  30.0 - 36.0 g/dL Final  . RDW 07/23/2018 15.7* 11.5 - 15.5 % Final  . Platelets 07/23/2018 20* 150 - 400 K/uL Final   Comment: SPECIMEN CHECKED FOR CLOTS PLATELET COUNT CONFIRMED BY SMEAR CRITICAL RESULT CALLED TO, READ BACK BY AND VERIFIED  WITH: ROBERTS, T'@0921'  BY MATTHEWS, B 9.9.19   . Neutrophils Relative % 07/23/2018 TOO FEW TO COUNT, SMEAR AVAILABLE FOR REVIEW  % Final   Performed at [facility], [address] [place], [place] [address]  . Sodium 07/23/2018 136  135 - 145 mmol/L Final  . Potassium 07/23/2018 3.4* 3.5 - 5.1 mmol/L Final  . Chloride 07/23/2018 106  98 - 111 mmol/L Final  . CO2 07/23/2018 27  22 - 32 mmol/L Final  . Glucose, Bld 07/23/2018 118* 70 - 99 mg/dL Final  . BUN 07/23/2018 14  6 - 20 mg/dL Final  . Creatinine, Ser 07/23/2018 0.90  0.61 - 1.24 mg/dL Final  . Calcium 07/23/2018 8.3* 8.9 - 10.3 mg/dL Final  . GFR calc non Af Amer 07/23/2018 >60  >60 mL/min Final  . GFR calc Af Amer 07/23/2018 >60  >60 mL/min Final   Comment: (NOTE) The eGFR has been calculated using the CKD EPI equation. This calculation has not been validated in all clinical situations. eGFR's persistently <60 mL/min signify possible Chronic Kidney Disease.   Georgiann Hahn gap 07/23/2018 3* 5 - 15 Final   Performed at [facility], [address] [place], [place] [address]  . Total Protein 07/23/2018 6.4* 6.5 -  8.1 g/dL Final  . Albumin 07/23/2018 3.1* 3.5 - 5.0 g/dL Final  . AST 07/23/2018 17  15 - 41 U/L Final  . ALT 07/23/2018 21  0 - 44 U/L Final  . Alkaline Phosphatase 07/23/2018 48  38 - 126 U/L Final  . Total Bilirubin 07/23/2018 1.1  0.3 - 1.2 mg/dL Final  . Bilirubin, Direct 07/23/2018 0.1  0.0 - 0.2 mg/dL Final  . Indirect Bilirubin 07/23/2018 1.0* 0.3 - 0.9 mg/dL Final   Performed at [facility], [address] [place], [place] [address]  . Magnesium 07/23/2018 1.5* 1.7 - 2.4 mg/dL Final   Performed at [facility], [address] [place], [place] [address]  . Phosphorus 07/23/2018 3.6  2.5 - 4.6 mg/dL Final   Performed at [facility], [address] [place], [place] [address]  . MRSA by PCR 07/23/2018 NEGATIVE  NEGATIVE Final   Comment:        The GeneXpert MRSA Assay (FDA approved for NASAL specimens only), is  one component of a comprehensive MRSA colonization surveillance program. It is not intended to diagnose MRSA infection nor to guide or monitor treatment for MRSA infections. Performed at [facility], [address] [place], [place] [address]   . ABO/RH(D) 07/23/2018 O POS   Final  . Antibody Screen 07/23/2018 NEG   Final  . Sample Expiration 07/23/2018 07/26/2018   Final  . Unit Number 07/23/2018 [id number]   Final  . Blood Component Type 07/23/2018 RBC, LR IRR   Final  . Unit division 07/23/2018 00   Final  . Status of Unit 07/23/2018 ALLOCATED   Final  . Transfusion Status 07/23/2018 OK TO TRANSFUSE   Final  . Crossmatch Result 07/23/2018 COMPATIBLE   Final  . Unit Number 07/23/2018 [id number]   Final  . Blood Component Type 07/23/2018 RCLI PHER 2   Final  . Unit division 07/23/2018 00   Final  . Status of Unit 07/23/2018 ALLOCATED   Final  . Transfusion Status 07/23/2018 OK TO TRANSFUSE   Final  . Crossmatch Result 07/23/2018 COMPATIBLE   Final  . Order Confirmation 07/23/2018    Final                   Value:ORDER PROCESSED BY BLOOD BANK Performed at [facility], [address], [place], [place] [address]   . Blood Product Unit Number 07/23/2018 [id number]   Final  . Unit Type and Rh 07/23/2018 5100   Final  . Blood Product Expiration Date 07/23/2018 [id number]   Final  . Blood Product Unit Number 07/23/2018 [id number]   Final  . Unit Type and Rh 07/23/2018 5100   Final  . Blood Product Expiration Date 07/23/2018 [id number]   Final     Pathology Orders Placed This Encounter  Procedures  . Culture, blood (Routine x 2)    Standing Status:   Standing    Number of Occurrences:   2  . MRSA PCR Screening    Standing Status:   Standing    Number of Occurrences:   1  . DG Chest 2 View    Standing Status:   Standing    Number of Occurrences:   1    Order Specific Question:   Reason for Exam (SYMPTOM  OR DIAGNOSIS REQUIRED)    Answer:   fever,  .  Comprehensive metabolic panel    Standing Status:   Standing    Number of Occurrences:   1  . CBC with Differential  Standing Status:   Standing    Number of Occurrences:   1  . Protime-INR    Standing Status:   Standing    Number of Occurrences:   1  . Urinalysis, Routine w reflex microscopic    Standing Status:   Standing    Number of Occurrences:   1  . CBC with Differential    Standing Status:   Standing    Number of Occurrences:   9  . Basic metabolic panel    Standing Status:   Standing    Number of Occurrences:   2  . Hepatic function panel    Standing Status:   Standing    Number of Occurrences:   9  . Magnesium    Standing Status:   Standing    Number of Occurrences:   1  . Phosphorus    Standing Status:   Standing    Number of Occurrences:   1  . Creatinine, serum    Standing Status:   Standing    Number of Occurrences:   4  . CBC    Standing Status:   Standing    Number of Occurrences:   1  . Differential    Standing Status:   Standing    Number of Occurrences:   9  . Diet Heart Room service appropriate? Yes; Fluid consistency: Thin    Standing Status:   Standing    Number of Occurrences:   1    Order Specific Question:   Room service appropriate?    Answer:   Yes    Order Specific Question:   Fluid consistency:    Answer:   Thin  . Notify Physician if pt is possible Sepsis patient    Standing Status:   Standing    Number of Occurrences:   1  . Document Actual / Estimated Weight    Standing Status:   Standing    Number of Occurrences:   1  . Insert / maintain saline lock    Standing Status:   Standing    Number of Occurrences:   1  . Document vital signs within 1-hour of fluid bolus completion and notify provider of bolus completion    Standing Status:   Standing    Number of Occurrences:   1  . Cardiac Monitoring Continuous x 12 hours Indications for use: Other; other indications for use: Sepsis with tachycardia    Standing Status:   Standing     Number of Occurrences:   1    Order Specific Question:   Indications for use:    Answer:   Other    Order Specific Question:   other indications for use:    Answer:   Sepsis with tachycardia  . Vital signs    Standing Status:   Standing    Number of Occurrences:   1  . Notify physician    Standing Status:   Standing    Number of Occurrences:   20    Order Specific Question:   Notify Physician    Answer:   for pulse less than 55 or greater than 120    Order Specific Question:   Notify Physician    Answer:   for respiratory rate less than 12 or greater than 25    Order Specific Question:   Notify Physician    Answer:   for temperature greater than 100.5 F    Order Specific Question:   Notify Physician  Answer:   for urinary output less than 30 mL/kg/hr for four hours    Order Specific Question:   Notify Physician    Answer:   for systolic BP less than 90 or greater than 992, diastolic BP less than 60 or greater than 100  . Initiate Oral Care Protocol    Standing Status:   Standing    Number of Occurrences:   1  . Initiate Carrier Fluid Protocol    Standing Status:   Standing    Number of Occurrences:   1  . RN may order General Admission PRN Orders utilizing "General Admission PRN medications" (through manage orders) for the following patient needs: allergy symptoms (Claritin), cold sores (Carmex), cough (Robitussin DM), eye irritation (Liquifilm Tears), hemorrhoids (Tucks), indigestion (Maalox), minor skin irritation (Hydrocortisone Cream), muscle pain Suezanne Jacquet Gay), nose irritation (saline nasal spray) and sore throat (Chloraseptic spray).    Standing Status:   Standing    Number of Occurrences:   [id number]  . SCDs    Standing Status:   Standing    Number of Occurrences:   1    Order Specific Question:   Laterality    Answer:   Bilateral  . Patient has an active order for admit to inpatient/place in observation    Standing Status:   Standing    Number of Occurrences:   1  . Up with  assistance    Standing Status:   Standing    Number of Occurrences:   [id number]  . Apply cooling blanket    Standing Status:   Standing    Number of Occurrences:   1  . Practitioner attestation of consent    I, the ordering practitioner, attest that I have discussed with the patient the benefits, risks, side effects, alternatives, likelihood of achieving goals and potential problems during recovery for the procedure listed.    Standing Status:   Standing    Number of Occurrences:   1    Order Specific Question:   Procedure    Answer:   Blood product(s)  . Complete patient signature process for consent form    Standing Status:   Standing    Number of Occurrences:   1  . CBC post transfusion - RN to place lab order with appropriate draw time    Standing Status:   Standing    Number of Occurrences:   1  . Full code    Standing Status:   Standing    Number of Occurrences:   1  . ceFEPime (MAXIPIME) per pharmacy consult    Standing Status:   Standing    Number of Occurrences:   1    Order Specific Question:   Antibiotic Indication:    Answer:   Other Indication (list below)    Order Specific Question:   Other Indication:    Answer:   Unknown source  . vancomycin per pharmacy consult    Standing Status:   Standing    Number of Occurrences:   1    Order Specific Question:   Indication:    Answer:   Other Indication (list below)    Order Specific Question:   Other Indication:    Answer:   Unknown source  . Consult to hospitalist    Standing Status:   Standing    Number of Occurrences:   1    Order Specific Question:   Place call to:    Answer:   [employer]  Specific Question:   Reason for Consult    Answer:   Admit  . Consult to hematology Consult Timeframe: ROUTINE - requires response within 24 hours; Reason for Consult? Neutropenic fever, pancytopenia, AML    Standing Status:   Standing    Number of Occurrences:   1    Order Specific Question:   Consult  Timeframe    Answer:   ROUTINE - requires response within 24 hours    Order Specific Question:   Reason for Consult?    Answer:   Neutropenic fever, pancytopenia, AML  . Protective Precautions (isolation)    Standing Status:   Standing    Number of Occurrences:   1  . Oxygen therapy Mode or (Route): Nasal cannula; Liters Per Minute: 2; Keep 02 saturation: greater than 92 %    Standing Status:   Standing    Number of Occurrences:   1    Order Specific Question:   Mode or (Route)    Answer:   Nasal cannula    Order Specific Question:   Liters Per Minute    Answer:   2    Order Specific Question:   Keep 02 saturation    Answer:   greater than 92 %  . I-Stat CG4 Lactic Acid, ED    Standing Status:   Standing    Number of Occurrences:   2  . ED EKG 12-Lead    Standing Status:   Standing    Number of Occurrences:   1    Order Specific Question:   Reason for Exam    Answer:   Baseline  . EKG  . Type and screen [place]     Standing Status:   Standing    Number of Occurrences:   1  . Prepare RBC    Standing Status:   Standing    Number of Occurrences:   1    Order Specific Question:   # of Units    Answer:   2 units    Order Specific Question:   Transfusion Indications    Answer:   Other (Specify)    Order Specific Question:   If emergent release call blood bank    Answer:   Not emergent release  . Admit to Inpatient (patient's expected length of stay will be greater than 2 midnights or inpatient only procedure)    Standing Status:   Standing    Number of Occurrences:   1    Order Specific Question:   Hospital Area    Answer:   [facility] [[id number]]    Order Specific Question:   Level of Care    Answer:   Telemetry [5]    Order Specific Question:   Diagnosis    Answer:   Febrile neutropenia [facility]) [[id number]]    Order Specific Question:   Admitting Physician    Answer:   Reubin Milan [[id number]]    Order Specific Question:    Attending Physician    Answer:   Reubin Milan [[id number]]    Order Specific Question:   Estimated length of stay    Answer:   past midnight tomorrow    Order Specific Question:   Certification:    Answer:   I certify this patient will need inpatient services for at least 2 midnights    Order Specific Question:   PT Class (Do Not Modify)    Answer:   Inpatient [101]  Order Specific Question:   PT Acc Code (Do Not Modify)    Answer:   Private [1]      Assessment and Plan:  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  Will notify them of admission.  Continue  Supportive therapy with transfusion prn.  Monitor of fevers.    2.  Neutropenic fever.  Pt currently afebrile.  Pt on Cefepime and Vanc.  He was last treated with growth factor on 07/17/2018.   Will Continue to monitor for temperatures and broaden abx prn.  Monitor cultures.  WBC 0.1.  Follow CBC daily.  Pharmacy to dose Vancomycin.  3.  Anemia. HB 6.9. Transfuse with leukoreduced and irradiated blood products.    4.  Thrombocytopenia.  Plt count 20,000.  Will transfuse if Plts <10,000 or bleeding.  Irradiated products.    5  Nutrition.  Nutrition evaluation per ICU team.

## 2018-07-23 NOTE — H&P (Signed)
History and Physical    Bryce Hamilton TGG:[id number] DOB: [birth year] DOA: 07/22/2018  PCP: Mikey Kirschner, MD   Patient coming from: Home.  I have personally briefly reviewed patient's old medical records in [place]  Chief Complaint: Fever.  HPI: Bryce Hamilton is a 52 y.o. male with medical history significant of hyperlipidemia, hypertension, AML with recent chemotherapy treatment with cytarabine cycle # 4 who is coming to the emergency department due to febrile neutropenia.  Per patient, he started having fever in the evening around 2200. He just received his last high dose cytarabine treatment this past week and was discharged three days ago. He mentions that he has had febrile neutropenia in 3 out of four cycles of chemotherapy. He has been taking prophylactic acyclovir, fluconazole and levofloxacin since discharge. He denies travel history. He denies headache, sore throat, rhinorrhea, cough, wheezing, hemoptysis, nausea, emesis, diarrhea, constipation, dysuria, frequency or skin rashes. He denies CP, palpitations, dyspnea, diaphoresis, PND or orthopnea. No abdominal or flank pain. No polyuria, polydipsia or polyphagia.  ED Course: Initial vital signs temperature 102.9 F, pulse 120, respirations 20, blood pressure 169/107 mmHg and O2 sat 98% on room air.  The patient received 650 mg of oral acetaminophen, a 1000 mL of NS bolus, cefepime, IV metronidazole and vancomycin in the ED.  Blood cultures x2 were drawn.  His work-up shows a normal urinalysis, his white count was less than 0.1, hemoglobin 7.3 g/dL and platelets 27.  PT was 15.9 and INR 1.28.  Lactic acid was normal at 1.08 mmol/L.  CMP shows a glucose of 124 and total bilirubin of 1.7 mg/dL.  All other chemistry values are so far normal.  His chest radiograph shows a right jugular Port-A-Cath, but did not show any acute abnormalities.  Review of Systems: As per HPI otherwise 10 point review of systems negative.   Past  Medical History:  Diagnosis Date  . Hyperlipidemia   . Hypertension   . Leukemia [facility])     Past Surgical History:  Procedure Laterality Date  . APPENDECTOMY     has had scar tissue removed in that area since appendectomy  . CHOLECYSTECTOMY    . COLONOSCOPY N/A 02/10/2016   Procedure: COLONOSCOPY;  Surgeon: Bryce Binder, MD;  Location: [facility];  Service: Endoscopy;  Laterality: N/A;  1:45 PM     reports that he has never smoked. He has never used smokeless tobacco. He reports that he does not drink alcohol or use drugs.  No Known Allergies  Family History  Problem Relation Age of Onset  . Hypertension Mother   . Diabetes Father   . Leukemia Father     Prior to Admission medications   Medication Sig Start Date End Date Taking? Authorizing Provider  acyclovir (ZOVIRAX) 400 MG tablet Take 1 tablet (400 mg total) by mouth 2 times daily while neutropenic. Your physician will instruct you when to START and STOP taking. **Please start 04/24/18** 04/23/18  Yes [provider]  fluconazole (DIFLUCAN) 200 MG tablet Take 1 tablet (200 mg total) by mouth daily while neutropenic. Your physician will instruct you when to START and STOP taking. **Please start 04/24/18** 04/23/18  Yes [provider]  levofloxacin (LEVAQUIN) 500 MG tablet Take 1 tablet (500 mg total) by mouth daily while neutropenic. Your physician will instruct you when to START and STOP taking. **Please start 04/24/18** 04/23/18  Yes [provider]  pantoprazole (PROTONIX) 40 MG tablet Take by mouth. 02/28/18  Yes [provider]  amLODipine (NORVASC) 5 MG tablet  02/28/18   [provider]  finasteride (PROSCAR) 5 MG tablet Take by mouth. 02/28/18   [provider]  hydrOXYzine (ATARAX/VISTARIL) 25 MG tablet Take by mouth. 02/28/18   [provider]  polyethylene glycol (MIRALAX / GLYCOLAX) packet Take 17 g by mouth. 02/28/18   [provider]  SM SENNA-S  8.6-50 MG tablet  02/28/18   [provider]    Physical Exam: Vitals:   07/23/18 0000 07/23/18 0030 07/23/18 0058 07/23/18 0100  BP: (!) 153/96 (!) 165/97  (!) 151/97  Pulse: (!) 121 (!) 119  (!) 118  Resp:    20  Temp:   (!) 103 F (39.4 C)   TempSrc:   Oral   SpO2: 99% 97%  100%  Weight:      Height:        Constitutional: Febrile, but otherwise in NAD, calm, comfortable Eyes: PERRL, lids and conjunctivae normal ENMT: Mucous membranes are moist. Posterior pharynx clear of any exudate or lesions.  Neck: normal, supple, no masses, no thyromegaly Respiratory: clear to auscultation bilaterally, no wheezing, no crackles. Normal respiratory effort. No accessory muscle use.  Chest:Right sided Port-A-Cath present on upper chest Cardiovascular: Tachycardic with a regular rhythm at 130 BPM, no murmurs / rubs / gallops. No extremity edema. 2+ pedal pulses. No carotid bruits.  Abdomen: no tenderness, no masses palpated. No hepatosplenomegaly. Bowel sounds positive.  Musculoskeletal: no clubbing / cyanosis. Good ROM, no contractures. Normal muscle tone.  Skin: no rashes, lesions, ulcers on limited dermatological examination. Neurologic: CN 2-12 grossly intact. Sensation intact, DTR normal. Strength 5/5 in all 4.  Psychiatric: Normal judgment and insight. Alert and oriented x 4. Normal mood.   Labs on Admission: I have personally reviewed following labs and imaging studies  CBC: Recent Labs  Lab 07/19/18 1141 07/23/18 0014  WBC 4.1 <0.1*  NEUTROABS 4.0  --   HGB 8.8* 7.3*  HCT 26.8* 21.9*  MCV 97.5 95.6  PLT 121* 27*   Basic Metabolic Panel: Recent Labs  Lab 07/19/18 1141 07/23/18 0014  NA 140 137  K 3.8 3.5  CL 106 105  CO2 25 24  GLUCOSE 116* 124*  BUN 14 18  CREATININE 0.85 1.02  CALCIUM 9.2 9.1  MG 2.0  --    GFR: Estimated Creatinine Clearance: 94.7 mL/min (by C-G formula based on SCr of 1.02 mg/dL). Liver Function Tests: Recent Labs  Lab  07/19/18 1141 07/23/18 0014  AST 23 21  ALT 33 25  ALKPHOS 65 56  BILITOT 1.6* 1.7*  PROT 7.5 7.0  ALBUMIN 3.6 3.6   No results for input(s): LIPASE, AMYLASE in the last 168 hours. No results for input(s): AMMONIA in the last 168 hours. Coagulation Profile: Recent Labs  Lab 07/23/18 0014  INR 1.28   Cardiac Enzymes: No results for input(s): CKTOTAL, CKMB, CKMBINDEX, TROPONINI in the last 168 hours. BNP (last 3 results) No results for input(s): PROBNP in the last 8760 hours. HbA1C: No results for input(s): HGBA1C in the last 72 hours. CBG: No results for input(s): GLUCAP in the last 168 hours. Lipid Profile: No results for input(s): CHOL, HDL, LDLCALC, TRIG, CHOLHDL, LDLDIRECT in the last 72 hours. Thyroid Function Tests: No results for input(s): TSH, T4TOTAL, FREET4, T3FREE, THYROIDAB in the last 72 hours. Anemia Panel: No results for input(s): VITAMINB12, FOLATE, FERRITIN, TIBC, IRON, RETICCTPCT in the last 72 hours. Urine analysis:    Component Value  Date/Time   COLORURINE YELLOW 01/21/2018 1250   APPEARANCEUR CLEAR 01/21/2018 1250   LABSPEC 1.026 01/21/2018 1250   PHURINE 6.0 01/21/2018 1250   GLUCOSEU NEGATIVE 01/21/2018 1250   HGBUR NEGATIVE 01/21/2018 1250   BILIRUBINUR NEGATIVE 01/21/2018 1250   KETONESUR NEGATIVE 01/21/2018 1250   PROTEINUR NEGATIVE 01/21/2018 1250   UROBILINOGEN 0.2 10/26/2009 1728   NITRITE NEGATIVE 01/21/2018 1250   LEUKOCYTESUR NEGATIVE 01/21/2018 1250    Radiological Exams on Admission: Dg Chest 2 View  Result Date: 07/23/2018 CLINICAL DATA:  Fever starting tonight, finished chemotherapy last week, history leukemia EXAM: CHEST - 2 VIEW COMPARISON:  01/21/2018 FINDINGS: RIGHT jugular Port-A-Cath with tip projecting over SVC near cavoatrial junction. Normal heart size, mediastinal contours, and pulmonary vascularity. Lungs clear. No pleural effusion or pneumothorax. Bones unremarkable. IMPRESSION: No acute abnormalities. Electronically  Signed   By: Lavonia Dana M.D.   On: 07/23/2018 01:07    EKG: Independently reviewed.  Vent. rate 119 BPM PR interval * ms QRS duration 85 ms QT/QTc 316/445 ms P-R-T axes 62 -5 74  Assessment/Plan Principal Problem:   Febrile neutropenia (HCC) Admit to telemetry/inpatient. Neutropenic precautions. Continue IV fluids. Continue cefepime per pharmacy. Continue vancomycin per pharmacy. Continue metronidazole 500 mg IVPB every 8 hours. Continue prophylactic acyclovir, fluconazole and levofloxacin Follow WBC in a.m.  Consider Neulasta/Neupogen if no improvement. Consult hematology in the morning.  Active Problems:   Pancytopenia due to chemotherapy ([place]) Follow CBC with differential daily. Consult hematology/oncology.      AML (acute myeloid leukemia) with failed remission ([place]) Continue follow-ups and therapy per primary hemo-onc team.    Sinus tachycardia Secondary to fever and electrolyte disturbance. Continue IVF. Extra dose of tylenol 325 mg for a total of 975 mg. Correct magnesium and supplement potassium.    Hypomagnesemia Replacing. Follow up level as needed.    Hyperlipidemia Currently not on medical therapy. Continue lifestyle modifications. Fasting lipid follow-up as an outpatient.    Hyperbilirubinemia Follow up level. Consider hemolysis work up if it rises.    DVT prophylaxis: Lovenox SQ. Code Status: Full code. Family Communication: His wife was present in the ED room. Disposition Plan: Admit for IV hydration, IV antibiotic therapy and hematology consult in the morning. Consults called: Routine hematology consult. Admission status: Inpatient/telemetry.   Reubin Milan MD [facility] Pager [phone].  If 7PM-7AM, please contact night-coverage [email or web address] Password [email or web address]  07/23/2018, 1:25 AM

## 2018-07-23 NOTE — Progress Notes (Signed)
ANTIBIOTIC CONSULT NOTE-Preliminary  Pharmacy Consult for Vancomycin and cefepime Indication: neutropenic fever  No Known Allergies  Patient Measurements: Height: 6\' 1"  (185.4 cm) Weight: 210 lb (95.3 kg) IBW/kg (Calculated) : 79.9  Vital Signs: Temp: 103.2 F (39.6 C) (09/09 0245) Temp Source: Oral (09/09 0245) BP: 146/93 (09/09 0130) Pulse Rate: 118 (09/09 0130)  Labs: Recent Labs    07/23/18 0014  WBC <0.1*  HGB 7.3*  PLT 27*  CREATININE 1.02    Estimated Creatinine Clearance: 94.7 mL/min (by C-G formula based on SCr of 1.02 mg/dL).  No results for input(s): VANCOTROUGH, VANCOPEAK, VANCORANDOM, GENTTROUGH, GENTPEAK, GENTRANDOM, TOBRATROUGH, TOBRAPEAK, TOBRARND, AMIKACINPEAK, AMIKACINTROU, AMIKACIN in the last 72 hours.   Microbiology: Recent Results (from the past 720 hour(s))  Culture, blood (Routine x 2)     Status: None (Preliminary result)   Collection Time: 07/23/18 12:00 AM  Result Value Ref Range Status   Specimen Description PORTA CATH  Final   Special Requests   Final    BOTTLES DRAWN AEROBIC AND ANAEROBIC Blood Culture adequate volume Performed at [facility], [address] [place], [place] [address]    Culture PENDING  Incomplete   Report Status PENDING  Incomplete  Culture, blood (Routine x 2)     Status: None (Preliminary result)   Collection Time: 07/23/18 12:17 AM  Result Value Ref Range Status   Specimen Description BLOOD LEFT HAND  Final   Special Requests   Final    BOTTLES DRAWN AEROBIC AND ANAEROBIC Blood Culture adequate volume Performed at [facility], [address] [place], [place] [address]    Culture PENDING  Incomplete   Report Status PENDING  Incomplete    Medical History: Past Medical History:  Diagnosis Date  . Hyperlipidemia   . Hypertension   . Leukemia (HCC)     Medications:  Vancomycin 1000 mg x 1 dose ordered by the EDP Cefepime 2 Gm IV x 1 dose ordered by the EDP  Assessment: 52 yo male s/p 4th round of  chemotherapy for AML seen in the ED for fever to 103.6 with chills. Pharmacy has been consulted for empiric vancomycin and cefepime dosing  Goal of Therapy:  Vancomycin troughs 15-20 mcg/ml Eradicate infection  Plan:  Preliminary review of pertinent patient information completed.  Protocol will be initiated with 1 dose of Vancomycin 1000 mg IV in addition to the 1000 mg dose ordered by the EDP, for a total loading dose of 2000 mg IV.  Forestine Na clinical pharmacist will complete review during morning rounds to assess patient and finalize treatment regimen if needed.  Norberto Sorenson, [facility] 07/23/2018,2:47 AM

## 2018-07-24 DIAGNOSIS — B9620 Unspecified Escherichia coli [E. coli] as the cause of diseases classified elsewhere: Secondary | ICD-10-CM | POA: Diagnosis present

## 2018-07-24 DIAGNOSIS — R7881 Bacteremia: Secondary | ICD-10-CM | POA: Diagnosis present

## 2018-07-24 DIAGNOSIS — C9200 Acute myeloblastic leukemia, not having achieved remission: Secondary | ICD-10-CM

## 2018-07-24 LAB — CBC WITH DIFFERENTIAL/PLATELET
HEMATOCRIT: 26 % — AB (ref 39.0–52.0)
HEMOGLOBIN: 8.8 g/dL — AB (ref 13.0–17.0)
MCH: 31.4 pg (ref 26.0–34.0)
MCHC: 33.8 g/dL (ref 30.0–36.0)
MCV: 92.9 fL (ref 78.0–100.0)
Platelets: 13 10*3/uL — CL (ref 150–400)
RBC: 2.8 MIL/uL — ABNORMAL LOW (ref 4.22–5.81)
RDW: 15.3 % (ref 11.5–15.5)
WBC: 0.1 10*3/uL — CL (ref 4.0–10.5)

## 2018-07-24 LAB — TYPE AND SCREEN
ABO/RH(D): O POS
ANTIBODY SCREEN: NEGATIVE
UNIT DIVISION: 0
UNIT DIVISION: 0

## 2018-07-24 LAB — BASIC METABOLIC PANEL
Anion gap: 6 (ref 5–15)
BUN: 12 mg/dL (ref 6–20)
CALCIUM: 8.4 mg/dL — AB (ref 8.9–10.3)
CHLORIDE: 108 mmol/L (ref 98–111)
CO2: 24 mmol/L (ref 22–32)
Creatinine, Ser: 0.9 mg/dL (ref 0.61–1.24)
Glucose, Bld: 109 mg/dL — ABNORMAL HIGH (ref 70–99)
Potassium: 3.6 mmol/L (ref 3.5–5.1)
SODIUM: 138 mmol/L (ref 135–145)

## 2018-07-24 LAB — BPAM RBC
Blood Product Expiration Date: 201909302359
Blood Product Expiration Date: 201910012359
ISSUE DATE / TIME: 201909092054
ISSUE DATE / TIME: 201909091739
Unit Type and Rh: 5100
Unit Type and Rh: 5100

## 2018-07-24 LAB — HEPATIC FUNCTION PANEL
ALBUMIN: 3.2 g/dL — AB (ref 3.5–5.0)
ALK PHOS: 51 U/L (ref 38–126)
ALT: 27 U/L (ref 0–44)
AST: 20 U/L (ref 15–41)
Bilirubin, Direct: 0.2 mg/dL (ref 0.0–0.2)
Indirect Bilirubin: 1.1 mg/dL — ABNORMAL HIGH (ref 0.3–0.9)
TOTAL PROTEIN: 6.6 g/dL (ref 6.5–8.1)
Total Bilirubin: 1.3 mg/dL — ABNORMAL HIGH (ref 0.3–1.2)

## 2018-07-24 MED ORDER — CHLORHEXIDINE GLUCONATE CLOTH 2 % EX PADS
6.0000 | MEDICATED_PAD | Freq: Every day | CUTANEOUS | Status: DC
  Administered 2018-07-24 – 2018-07-30 (×7): 6 via TOPICAL

## 2018-07-24 MED ORDER — SODIUM CHLORIDE 0.9% IV SOLUTION
Freq: Once | INTRAVENOUS | Status: AC
  Administered 2018-07-24: 17:00:00 via INTRAVENOUS

## 2018-07-24 MED ORDER — ACYCLOVIR 800 MG PO TABS: 400.0000 mg | ORAL_TABLET | Freq: Two times a day (BID) | ORAL | Status: DC

## 2018-07-24 MED ORDER — SODIUM CHLORIDE 0.9% FLUSH
10.0000 mL | Freq: Two times a day (BID) | INTRAVENOUS | Status: DC
  Administered 2018-07-24 – 2018-07-30 (×13): 10 mL

## 2018-07-24 MED ORDER — SODIUM CHLORIDE 0.9% FLUSH: 10.0000 mL | INTRAVENOUS | Status: DC | PRN

## 2018-07-24 MED ORDER — ACYCLOVIR 800 MG PO TABS
400.0000 mg | ORAL_TABLET | Freq: Two times a day (BID) | ORAL | Status: DC
  Administered 2018-07-24 – 2018-07-30 (×12): 400 mg via ORAL
  Filled 2018-07-24 (×12): qty 1

## 2018-07-24 MED ORDER — SODIUM CHLORIDE 0.9 % IV SOLN
1.0000 g | Freq: Three times a day (TID) | INTRAVENOUS | Status: DC
  Administered 2018-07-24 – 2018-07-27 (×9): 1 g via INTRAVENOUS
  Filled 2018-07-24 (×22): qty 1

## 2018-07-24 MED ORDER — MEPERIDINE HCL 25 MG/ML IJ SOLN: 25.0000 mg | Freq: Once | INTRAMUSCULAR | Status: AC

## 2018-07-24 MED ORDER — MEPERIDINE HCL 25 MG/ML IJ SOLN
25.0000 mg | Freq: Once | INTRAMUSCULAR | Status: DC
  Administered 2018-07-24: 25 mg via INTRAVENOUS

## 2018-07-24 MED ORDER — ACETAMINOPHEN 325 MG PO TABS
650.0000 mg | ORAL_TABLET | Freq: Once | ORAL | Status: AC
  Administered 2018-07-24: 650 mg via ORAL
  Filled 2018-07-24: qty 2

## 2018-07-24 NOTE — Progress Notes (Signed)
CRITICAL VALUE ALERT  Critical Value:  Platlets 13  Date & Time Notied: 07/24/18  0740  Provider Notified: Estanislado Spire  Orders Received/Actions taken: MD made aware no new orders at this time    Jeris Penta, RN

## 2018-07-24 NOTE — Progress Notes (Signed)
Diagnosis Neutropenic fever ([place])  Pancytopenia ([place])  Staging Cancer Staging No matching staging information was found for the patient.  Subjective: Pt having shaking chills.  Alert.  Objective: Vitals Patient Vitals for the past 24 hrs:  BP Temp Temp src Pulse Resp SpO2 Weight  07/24/18 1200 - - - 84 (!) 21 100 % -  07/24/18 1122 - 99 F (37.2 C) Oral - 16 - -  07/24/18 1012 - - - - - 100 % -  07/24/18 0900 123/76 - - 74 17 100 % -  07/24/18 0800 129/85 - - 72 (!) 28 100 % -  07/24/18 0728 - 98.1 F (36.7 C) Oral 72 17 100 % -  07/24/18 0700 (!) 131/96 - - 81 18 99 % -  07/24/18 0600 122/76 - - 79 (!) 0 100 % -  07/24/18 0500 135/70 - - 70 (!) 0 100 % -  07/24/18 0400 - 99.8 F (37.7 C) Oral - - - 215 lb 6.2 oz (97.7 kg)  07/24/18 0100 139/87 - - 80 (!) 21 100 % -  07/24/18 0000 (!) 144/81 (!) 100.4 F (38 C) Rectal 94 (!) 21 99 % -  07/23/18 2326 (!) 144/72 100.3 F (37.9 C) Oral 90 (!) 23 98 % -  07/23/18 2200 (!) 155/96 - - (!) 103 (!) 22 100 % -  07/23/18 2130 (!) 169/97 (!) 103.5 F (39.7 C) Rectal (!) 114 13 100 % -  07/23/18 2100 (!) 124/97 (!) 103.5 F (39.7 C) Rectal (!) 113 - 100 % -  07/23/18 2024 118/81 (!) 100.7 F (38.2 C) Oral (!) 105 (!) 22 98 % -  07/23/18 2000 (!) 150/98 - - (!) 113 (!) 21 97 % -  07/23/18 1900 (!) 161/78 - - 81 19 100 % -  07/23/18 1805 (!) 168/94 99.8 F (37.7 C) Rectal (!) 123 16 100 % -  07/23/18 1800 (!) 166/92 - - 83 17 100 % -  07/23/18 1755 (!) 158/97 - - 91 19 100 % -  07/23/18 1750 - - - 89 16 100 % -  07/23/18 1745 - - - 95 15 100 % -  07/23/18 1730 - - - 89 17 100 % -  07/23/18 1728 - 99.5 F (37.5 C) Rectal 81 18 100 % -  07/23/18 1715 - - - 93 13 100 % -  07/23/18 1700 - - - 92 (!) 22 100 % -  07/23/18 1600 - - - 81 17 100 % -  07/23/18 1500 - - - 85 20 100 % -  07/23/18 1400 - - - 86 (!) 7 100 % -    Physical Exam  Constitutional: Well-developed, well-nourished, and in no distress.  Having chills HENT:  Head: Normocephalic and atraumatic.  Mouth/Throat: No oropharyngeal exudate. Mucosa moist. Eyes: Pupils are equal, round, and reactive to light. Conjunctivae are normal. No scleral icterus.  Neck: Normal range of motion. Neck supple. No JVD present.  Cardiovascular: Normal rate, regular rhythm and normal heart sounds.  Exam reveals no gallop and no friction rub.   No murmur heard. Pulmonary/Chest: Effort normal and breath sounds normal. No respiratory distress. No wheezes.No rales.  Abdominal: Soft. Bowel sounds are normal. No distension. There is no tenderness. There is no guarding.  Musculoskeletal: No edema or tenderness.  Lymphadenopathy: No cervical, axillary or supraclavicular adenopathy.  Neurological: Alert and oriented to person, place, and time. No cranial nerve deficit.  Skin: Skin is warm and dry. No rash  noted. No erythema. No pallor.  Psychiatric: Affect and judgment normal.    Medications   Allergies Patient has no known allergies.  Review of Systems Review of Systems - Oncology ROS negative other than chills   Labs Admission on 07/22/2018  Component Date Value Ref Range Status  . Sodium 07/23/2018 137  135 - 145 mmol/L Final  . Potassium 07/23/2018 3.5  3.5 - 5.1 mmol/L Final  . Chloride 07/23/2018 105  98 - 111 mmol/L Final  . CO2 07/23/2018 24  22 - 32 mmol/L Final  . Glucose, Bld 07/23/2018 124* 70 - 99 mg/dL Final  . BUN 07/23/2018 18  6 - 20 mg/dL Final  . Creatinine, Ser 07/23/2018 1.02  0.61 - 1.24 mg/dL Final  . Calcium 07/23/2018 9.1  8.9 - 10.3 mg/dL Final  . Total Protein 07/23/2018 7.0  6.5 - 8.1 g/dL Final  . Albumin 07/23/2018 3.6  3.5 - 5.0 g/dL Final  . AST 07/23/2018 21  15 - 41 U/L Final  . ALT 07/23/2018 25  0 - 44 U/L Final  . Alkaline Phosphatase 07/23/2018 56  38 - 126 U/L Final  . Total Bilirubin 07/23/2018 1.7* 0.3 - 1.2 mg/dL Final  . GFR calc non Af Amer 07/23/2018 >60  >60 mL/min Final  . GFR calc Af Amer 07/23/2018 >60  >60  mL/min Final   Comment: (NOTE) The eGFR has been calculated using the CKD EPI equation. This calculation has not been validated in all clinical situations. eGFR's persistently <60 mL/min signify possible Chronic Kidney Disease.   Georgiann Hahn gap 07/23/2018 8  5 - 15 Final   Performed at [facility], [address], [place], [place] [address]  . Lactic Acid, Venous 07/23/2018 1.08  0.5 - 1.9 mmol/L Final  . WBC 07/23/2018 <0.1* 4.0 - 10.5 K/uL Final   Comment: RESULT REPEATED AND VERIFIED SMEAR STAINED AND AVAILABLE FOR REVIEW CRITICAL RESULT CALLED TO, READ BACK BY AND VERIFIED WITH: POINDEXTER,M @ 0048 ON 07/23/18 BY JUW   . RBC 07/23/2018 2.29* 4.22 - 5.81 MIL/uL Final  . Hemoglobin 07/23/2018 7.3* 13.0 - 17.0 g/dL Final  . HCT 07/23/2018 21.9* 39.0 - 52.0 % Final  . MCV 07/23/2018 95.6  78.0 - 100.0 fL Final  . MCH 07/23/2018 31.9  26.0 - 34.0 pg Final  . MCHC 07/23/2018 33.3  30.0 - 36.0 g/dL Final  . RDW 07/23/2018 15.9* 11.5 - 15.5 % Final  . Platelets 07/23/2018 27* 150 - 400 K/uL Final   Comment: RESULT REPEATED AND VERIFIED CRITICAL RESULT CALLED TO, READ BACK BY AND VERIFIED WITH: POINDEXTER,M @ 0048 ON 07/23/18 BY JUW   . Neutrophils Relative % 07/23/2018 TOO FEW TO COUNT, SMEAR AVAILABLE FOR REVIEW  % Final   Performed at [facility], [address], [place], [place] [address]  . Prothrombin Time 07/23/2018 15.9* 11.4 - 15.2 seconds Final  . INR 07/23/2018 1.28   Final   Performed at [facility], [address] [place], [place] [address]  . Specimen Description 07/23/2018    Final                   Value:PORTA CATH Performed at [facility], [address], [place], [place] [address]   . Special Requests 07/23/2018    Final                   Value:BOTTLES DRAWN AEROBIC AND ANAEROBIC Blood Culture adequate volume Performed at [facility], [address]  [place], [place] [address]   . Culture  Setup Time 07/23/2018    Final                   Value:GRAM NEGATIVE RODS BOTH  BOTTLES POSITIVE Gram Stain Report Called to,Read Back By and Verified With: BARBARA MORRIS 07/23/18 @ 1320 LLS Organism ID to follow CRITICAL RESULT CALLED TO, READ BACK BY AND VERIFIED WITH: Miquel Dunn RN 07/23/18 2044 JDW Performed at [facility], [place] [address] [place], [place] [address]   . Culture 07/23/2018 GRAM NEGATIVE RODS   Final  . Report Status 07/23/2018 PENDING   Incomplete  . Specimen Description 07/23/2018    Final                   Value:BLOOD LEFT HAND Performed at [facility], [address], [place], [place] [address]   . Special Requests 07/23/2018    Final                   Value:BOTTLES DRAWN AEROBIC AND ANAEROBIC Blood Culture adequate volume Performed at [facility], [address] [place], [place] [address]   . Culture  Setup Time 07/23/2018    Final                   Value:GRAM NEGATIVE RODS BOTH BOTTES OF SET Gram Stain Report Called to,Read Back By and Verified With: B MORRIS 07/23/18 @ 1320 LLS CRITICAL RESULT CALLED TO, READ BACK BY AND VERIFIED WITH: Miquel Dunn RN 07/23/18 2044 JDW Performed at [facility], [place] [address], [place], [place] [address]   . Culture 07/23/2018 GRAM NEGATIVE RODS   Final  . Report Status 07/23/2018 PENDING   Incomplete  . Color, Urine 07/23/2018 STRAW* YELLOW Final  . APPearance 07/23/2018 CLEAR  CLEAR Final  . Specific Gravity, Urine 07/23/2018 1.013  1.005 - 1.030 Final  . pH 07/23/2018 5.0  5.0 - 8.0 Final  . Glucose, UA 07/23/2018 NEGATIVE  NEGATIVE mg/dL Final  . Hgb urine dipstick 07/23/2018 NEGATIVE  NEGATIVE Final  . Bilirubin Urine 07/23/2018 NEGATIVE  NEGATIVE Final  . Ketones, ur 07/23/2018 NEGATIVE  NEGATIVE mg/dL Final  . Protein, ur 07/23/2018 NEGATIVE  NEGATIVE mg/dL Final  . Nitrite 07/23/2018 NEGATIVE  NEGATIVE Final  . Leukocytes, UA 07/23/2018 NEGATIVE  NEGATIVE Final   Performed at [facility], [address] [place], [place] [address]  . WBC 07/23/2018 0.1* 4.0 - 10.5 K/uL Final   Comment:  REPEATED TO VERIFY CRITICAL RESULT CALLED TO, READ BACK BY AND VERIFIED WITH: ROBERTS, T'@0921'  BY MATTHEWS, B 9.9.19   . RBC 07/23/2018 2.17* 4.22 - 5.81 MIL/uL Final  . Hemoglobin 07/23/2018 6.9* 13.0 - 17.0 g/dL Final   Comment: REPEATED TO VERIFY CRITICAL RESULT CALLED TO, READ BACK BY AND VERIFIED WITH: ROBERTS, T'@0921'  BY MATTHEWS, B 9.9.19   . HCT 07/23/2018 20.5* 39.0 - 52.0 % Final  . MCV 07/23/2018 94.5  78.0 - 100.0 fL Final  . MCH 07/23/2018 31.8  26.0 - 34.0 pg Final  . MCHC 07/23/2018 33.7  30.0 - 36.0 g/dL Final  . RDW 07/23/2018 15.7* 11.5 - 15.5 % Final  . Platelets 07/23/2018 20* 150 - 400 K/uL Final   Comment: SPECIMEN CHECKED FOR CLOTS PLATELET COUNT CONFIRMED BY SMEAR CRITICAL RESULT CALLED TO, READ BACK BY AND VERIFIED WITH: ROBERTS, T'@0921'  BY MATTHEWS, B 9.9.19   . Neutrophils Relative % 07/23/2018 TOO FEW TO COUNT, SMEAR AVAILABLE FOR REVIEW  %  Final   Performed at [facility], [address] [place], [place] [address]  . Sodium 07/23/2018 136  135 - 145 mmol/L Final  . Potassium 07/23/2018 3.4* 3.5 - 5.1 mmol/L Final  . Chloride 07/23/2018 106  98 - 111 mmol/L Final  . CO2 07/23/2018 27  22 - 32 mmol/L Final  . Glucose, Bld 07/23/2018 118* 70 - 99 mg/dL Final  . BUN 07/23/2018 14  6 - 20 mg/dL Final  . Creatinine, Ser 07/23/2018 0.90  0.61 - 1.24 mg/dL Final  . Calcium 07/23/2018 8.3* 8.9 - 10.3 mg/dL Final  . GFR calc non Af Amer 07/23/2018 >60  >60 mL/min Final  . GFR calc Af Amer 07/23/2018 >60  >60 mL/min Final   Comment: (NOTE) The eGFR has been calculated using the CKD EPI equation. This calculation has not been validated in all clinical situations. eGFR's persistently <60 mL/min signify possible Chronic Kidney Disease.   Georgiann Hahn gap 07/23/2018 3* 5 - 15 Final   Performed at [facility], [address], [place], [place] [address]  . Total Protein 07/23/2018 6.4* 6.5 - 8.1 g/dL Final  . Albumin 07/23/2018 3.1* 3.5 - 5.0 g/dL Final  . AST  07/23/2018 17  15 - 41 U/L Final  . ALT 07/23/2018 21  0 - 44 U/L Final  . Alkaline Phosphatase 07/23/2018 48  38 - 126 U/L Final  . Total Bilirubin 07/23/2018 1.1  0.3 - 1.2 mg/dL Final  . Bilirubin, Direct 07/23/2018 0.1  0.0 - 0.2 mg/dL Final  . Indirect Bilirubin 07/23/2018 1.0* 0.3 - 0.9 mg/dL Final   Performed at [facility], [address] [place], [place] [address]  . Magnesium 07/23/2018 1.5* 1.7 - 2.4 mg/dL Final   Performed at [facility], [address], [place], [place] [address]  . Phosphorus 07/23/2018 3.6  2.5 - 4.6 mg/dL Final   Performed at [facility], [address] [place], [place] [address]  . MRSA by PCR 07/23/2018 NEGATIVE  NEGATIVE Final   Comment:        The GeneXpert MRSA Assay (FDA approved for NASAL specimens only), is one component of a comprehensive MRSA colonization surveillance program. It is not intended to diagnose MRSA infection nor to guide or monitor treatment for MRSA infections. Performed at [facility], [address] [place], [place] [address]   . ABO/RH(D) 07/23/2018 O POS   Final  . Antibody Screen 07/23/2018 NEG   Final  . Sample Expiration 07/23/2018 07/26/2018   Final  . Unit Number 07/23/2018 [id number]   Final  . Blood Component Type 07/23/2018 RBC, LR IRR   Final  . Unit division 07/23/2018 00   Final  . Status of Unit 07/23/2018 ISSUED,FINAL   Final  . Transfusion Status 07/23/2018 OK TO TRANSFUSE   Final  . Crossmatch Result 07/23/2018 COMPATIBLE   Final  . Unit Number 07/23/2018 [id number]   Final  . Blood Component Type 07/23/2018 RCLI PHER 2   Final  . Unit division 07/23/2018 00   Final  . Status of Unit 07/23/2018 ISSUED,FINAL   Final  . Transfusion Status 07/23/2018 OK TO TRANSFUSE   Final  . Crossmatch Result 07/23/2018 COMPATIBLE   Final  . Order Confirmation 07/23/2018    Final                   Value:ORDER PROCESSED BY BLOOD BANK Performed at [facility], [address] [place], [place] [address]   .  ISSUE DATE /  TIME 07/23/2018 [id number]   Final  . Blood Product Unit Number 07/23/2018 [id number]   Final  . PRODUCT CODE 07/23/2018 [id number]   Final  . Unit Type and Rh 07/23/2018 5100   Final  . Blood Product Expiration Date 07/23/2018 [id number]   Final  . ISSUE DATE / TIME 07/23/2018 [id number]   Final  . Blood Product Unit Number 07/23/2018 [id number]   Final  . Unit Type and Rh 07/23/2018 5100   Final  . Blood Product Expiration Date 07/23/2018 [id number]   Final  . WBC 07/24/2018 0.1* 4.0 - 10.5 K/uL Final   CRITICAL VALUE NOTED.  VALUE IS CONSISTENT WITH PREVIOUSLY REPORTED AND CALLED VALUE.  Marland Kitchen RBC 07/24/2018 2.80* 4.22 - 5.81 MIL/uL Final  . Hemoglobin 07/24/2018 8.8* 13.0 - 17.0 g/dL Final  . HCT 07/24/2018 26.0* 39.0 - 52.0 % Final  . MCV 07/24/2018 92.9  78.0 - 100.0 fL Final  . MCH 07/24/2018 31.4  26.0 - 34.0 pg Final  . MCHC 07/24/2018 33.8  30.0 - 36.0 g/dL Final  . RDW 07/24/2018 15.3  11.5 - 15.5 % Final  . Platelets 07/24/2018 13* 150 - 400 K/uL Final   Comment: RESULT REPEATED AND VERIFIED CRITICAL RESULT CALLED TO, READ BACK BY AND VERIFIED WITH: MOTLEY,J AT 0725 BY HUFFINES,S ON 07/24/18.   Marland Kitchen Neutrophils Relative % 07/24/2018 TOO FEW TO COUNT, SMEAR AVAILABLE FOR REVIEW  % Final  . Neutro Abs 07/24/2018 TOO FEW TO COUNT, SMEAR AVAILABLE FOR REVIEW  1.7 - 7.7 K/uL Final  . Lymphocytes Relative 07/24/2018 TOO FEW TO COUNT, SMEAR AVAILABLE FOR REVIEW  % Final  . Lymphs Abs 07/24/2018 TOO FEW TO COUNT, SMEAR AVAILABLE FOR REVIEW  0.7 - 4.0 K/uL Final  . Monocytes Relative 07/24/2018 TOO FEW TO COUNT, SMEAR AVAILABLE FOR REVIEW  % Final  . Monocytes Absolute 07/24/2018 TOO FEW TO COUNT, SMEAR AVAILABLE FOR REVIEW  0.1 - 1.0 K/uL Final  . Eosinophils Relative 07/24/2018 TOO FEW TO COUNT, SMEAR AVAILABLE FOR REVIEW  % Final  . Eosinophils Absolute 07/24/2018 TOO FEW TO COUNT, SMEAR AVAILABLE FOR REVIEW  0.0 - 0.7 K/uL Final  . Basophils Relative 07/24/2018  TOO FEW TO COUNT, SMEAR AVAILABLE FOR REVIEW  % Final  . Basophils Absolute 07/24/2018 TOO FEW TO COUNT, SMEAR AVAILABLE FOR REVIEW  0.0 - 0.1 K/uL Final  . WBC Morphology 07/24/2018 Predominantly Lymphocytes Seen   Final   Performed at [facility], [address], [place], [place] [address]  . Sodium 07/24/2018 138  135 - 145 mmol/L Final  . Potassium 07/24/2018 3.6  3.5 - 5.1 mmol/L Final  . Chloride 07/24/2018 108  98 - 111 mmol/L Final  . CO2 07/24/2018 24  22 - 32 mmol/L Final  . Glucose, Bld 07/24/2018 109* 70 - 99 mg/dL Final  . BUN 07/24/2018 12  6 - 20 mg/dL Final  . Creatinine, Ser 07/24/2018 0.90  0.61 - 1.24 mg/dL Final  . Calcium 07/24/2018 8.4* 8.9 - 10.3 mg/dL Final  . GFR calc non Af Amer 07/24/2018 >60  >60 mL/min Final  . GFR calc Af Amer 07/24/2018 >60  >60 mL/min Final   Comment: (NOTE) The eGFR has been calculated using the CKD EPI equation. This calculation has not been validated in all clinical situations. eGFR's persistently <60 mL/min signify possible Chronic Kidney Disease.   Georgiann Hahn gap 07/24/2018 6  5 - 15 Final   Performed at [facility], [address] [place], [place] [address]  .  Total Protein 07/24/2018 6.6  6.5 - 8.1 g/dL Final  . Albumin 07/24/2018 3.2* 3.5 - 5.0 g/dL Final  . AST 07/24/2018 20  15 - 41 U/L Final  . ALT 07/24/2018 27  0 - 44 U/L Final  . Alkaline Phosphatase 07/24/2018 51  38 - 126 U/L Final  . Total Bilirubin 07/24/2018 1.3* 0.3 - 1.2 mg/dL Final  . Bilirubin, Direct 07/24/2018 0.2  0.0 - 0.2 mg/dL Final  . Indirect Bilirubin 07/24/2018 1.1* 0.3 - 0.9 mg/dL Final   Performed at [facility], [address], [place], [place] [address]  . Enterococcus species 07/23/2018 NOT DETECTED  NOT DETECTED Final  . Listeria monocytogenes 07/23/2018 NOT DETECTED  NOT DETECTED Final  . Staphylococcus species 07/23/2018 NOT DETECTED  NOT DETECTED Final  . Staphylococcus aureus 07/23/2018 NOT DETECTED  NOT DETECTED Final  . Streptococcus species  07/23/2018 NOT DETECTED  NOT DETECTED Final  . Streptococcus agalactiae 07/23/2018 NOT DETECTED  NOT DETECTED Final  . Streptococcus pneumoniae 07/23/2018 NOT DETECTED  NOT DETECTED Final  . Streptococcus pyogenes 07/23/2018 NOT DETECTED  NOT DETECTED Final  . Acinetobacter baumannii 07/23/2018 NOT DETECTED  NOT DETECTED Final  . Enterobacteriaceae species 07/23/2018 DETECTED* NOT DETECTED Final   Comment: Enterobacteriaceae represent a large family of gram-negative bacteria, not a single organism. CRITICAL RESULT CALLED TO, READ BACK BY AND VERIFIED WITH: Miquel Dunn RN 07/23/18 2044 JDW   . Enterobacter cloacae complex 07/23/2018 NOT DETECTED  NOT DETECTED Final  . Escherichia coli 07/23/2018 DETECTED* NOT DETECTED Final   Comment: CRITICAL RESULT CALLED TO, READ BACK BY AND VERIFIED WITH: Miquel Dunn RN 07/23/18 2044 JDW   . Klebsiella oxytoca 07/23/2018 NOT DETECTED  NOT DETECTED Final  . Klebsiella pneumoniae 07/23/2018 NOT DETECTED  NOT DETECTED Final  . Proteus species 07/23/2018 NOT DETECTED  NOT DETECTED Final  . Serratia marcescens 07/23/2018 NOT DETECTED  NOT DETECTED Final  . Carbapenem resistance 07/23/2018 NOT DETECTED  NOT DETECTED Final  . Haemophilus influenzae 07/23/2018 NOT DETECTED  NOT DETECTED Final  . Neisseria meningitidis 07/23/2018 NOT DETECTED  NOT DETECTED Final  . Pseudomonas aeruginosa 07/23/2018 NOT DETECTED  NOT DETECTED Final  . Candida albicans 07/23/2018 NOT DETECTED  NOT DETECTED Final  . Candida glabrata 07/23/2018 NOT DETECTED  NOT DETECTED Final  . Candida krusei 07/23/2018 NOT DETECTED  NOT DETECTED Final  . Candida parapsilosis 07/23/2018 NOT DETECTED  NOT DETECTED Final  . Candida tropicalis 07/23/2018 NOT DETECTED  NOT DETECTED Final   Performed at [facility] [address], [place], [place] [address]     Pathology Orders Placed This Encounter  Procedures  . Culture, blood (Routine x 2)    Standing Status:   Standing    Number of  Occurrences:   2  . MRSA PCR Screening    Standing Status:   Standing    Number of Occurrences:   1  . Blood Culture ID Panel (Reflexed)    Standing Status:   Standing    Number of Occurrences:   1  . DG Chest 2 View    Standing Status:   Standing    Number of Occurrences:   1    Order Specific Question:   Reason for Exam (SYMPTOM  OR DIAGNOSIS REQUIRED)    Answer:   fever,  . Comprehensive metabolic panel    Standing Status:   Standing    Number of Occurrences:   1  . CBC with Differential    Standing  Status:   Standing    Number of Occurrences:   1  . Protime-INR    Standing Status:   Standing    Number of Occurrences:   1  . Urinalysis, Routine w reflex microscopic    Standing Status:   Standing    Number of Occurrences:   1  . CBC with Differential    Standing Status:   Standing    Number of Occurrences:   17  . Basic metabolic panel    Standing Status:   Standing    Number of Occurrences:   2  . Hepatic function panel    Standing Status:   Standing    Number of Occurrences:   17  . Magnesium    Standing Status:   Standing    Number of Occurrences:   1  . Phosphorus    Standing Status:   Standing    Number of Occurrences:   1  . Creatinine, serum    Standing Status:   Standing    Number of Occurrences:   4  . CBC    Standing Status:   Standing    Number of Occurrences:   1  . Differential    Standing Status:   Standing    Number of Occurrences:   9  . Diet Heart Room service appropriate? Yes; Fluid consistency: Thin    Standing Status:   Standing    Number of Occurrences:   1    Order Specific Question:   Room service appropriate?    Answer:   Yes    Order Specific Question:   Fluid consistency:    Answer:   Thin  . Notify Physician if pt is possible Sepsis patient    Standing Status:   Standing    Number of Occurrences:   1  . Document Actual / Estimated Weight    Standing Status:   Standing    Number of Occurrences:   1  . Insert / maintain saline  lock    Standing Status:   Standing    Number of Occurrences:   1  . Document vital signs within 1-hour of fluid bolus completion and notify provider of bolus completion    Standing Status:   Standing    Number of Occurrences:   1  . Cardiac Monitoring Continuous x 12 hours Indications for use: Other; other indications for use: Sepsis with tachycardia    Standing Status:   Standing    Number of Occurrences:   1    Order Specific Question:   Indications for use:    Answer:   Other    Order Specific Question:   other indications for use:    Answer:   Sepsis with tachycardia  . Vital signs    Standing Status:   Standing    Number of Occurrences:   1  . Notify physician    Standing Status:   Standing    Number of Occurrences:   20    Order Specific Question:   Notify Physician    Answer:   for pulse less than 55 or greater than 120    Order Specific Question:   Notify Physician    Answer:   for respiratory rate less than 12 or greater than 25    Order Specific Question:   Notify Physician    Answer:   for temperature greater than 100.5 F    Order Specific Question:   Notify Physician  Answer:   for urinary output less than 30 mL/kg/hr for four hours    Order Specific Question:   Notify Physician    Answer:   for systolic BP less than 90 or greater than 876, diastolic BP less than 60 or greater than 100  . Initiate Oral Care Protocol    Standing Status:   Standing    Number of Occurrences:   1  . Initiate Carrier Fluid Protocol    Standing Status:   Standing    Number of Occurrences:   1  . RN may order General Admission PRN Orders utilizing "General Admission PRN medications" (through manage orders) for the following patient needs: allergy symptoms (Claritin), cold sores (Carmex), cough (Robitussin DM), eye irritation (Liquifilm Tears), hemorrhoids (Tucks), indigestion (Maalox), minor skin irritation (Hydrocortisone Cream), muscle pain Suezanne Jacquet Gay), nose irritation (saline nasal spray)  and sore throat (Chloraseptic spray).    Standing Status:   Standing    Number of Occurrences:   [id number]  . SCDs    Standing Status:   Standing    Number of Occurrences:   1    Order Specific Question:   Laterality    Answer:   Bilateral  . Patient has an active order for admit to inpatient/place in observation    Standing Status:   Standing    Number of Occurrences:   1  . Up with assistance    Standing Status:   Standing    Number of Occurrences:   [id number]  . Apply cooling blanket    Standing Status:   Standing    Number of Occurrences:   1  . Practitioner attestation of consent    I, the ordering practitioner, attest that I have discussed with the patient the benefits, risks, side effects, alternatives, likelihood of achieving goals and potential problems during recovery for the procedure listed.    Standing Status:   Standing    Number of Occurrences:   1    Order Specific Question:   Procedure    Answer:   Blood product(s)  . Complete patient signature process for consent form    Standing Status:   Standing    Number of Occurrences:   1  . CBC post transfusion - RN to place lab order with appropriate draw time    Standing Status:   Standing    Number of Occurrences:   1  . Apply neutral pressure cap    Standing Status:   Standing    Number of Occurrences:   20  . May draw labs from central line    Standing Status:   Standing    Number of Occurrences:   20  . For first catheter occlusion notify IV team ([place], [place]) or provider at all other sites.    Standing Status:   Standing    Number of Occurrences:   1  . Practitioner attestation of consent    I, the ordering practitioner, attest that I have discussed with the patient the benefits, risks, side effects, alternatives, likelihood of achieving goals and potential problems during recovery for the procedure listed.    Standing Status:   Standing    Number of Occurrences:   1    Order Specific Question:   Procedure    Answer:    Blood product(s)  . Complete patient signature process for consent form    Standing Status:   Standing    Number of Occurrences:   1  . Full code  Standing Status:   Standing    Number of Occurrences:   1  . Consult to hospitalist    Standing Status:   Standing    Number of Occurrences:   1    Order Specific Question:   Place call to:    Answer:   [employer] photographer Question:   Reason for Consult    Answer:   Admit  . 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    Answer:   Neutropenic fever, pancytopenia, AML  . Meropenem per pharmacyconsult    Standing Status:   Standing    Number of Occurrences:   1    Order Specific Question:   Antibiotic Indication:    Answer:   Febrile Neutropenia  . Protective Precautions (isolation)    Standing Status:   Standing    Number of Occurrences:   1  . Oxygen therapy Mode or (Route): Nasal cannula; Liters Per Minute: 2; Keep 02 saturation: greater than 92 %    Standing Status:   Standing    Number of Occurrences:   1    Order Specific Question:   Mode or (Route)    Answer:   Nasal cannula    Order Specific Question:   Liters Per Minute    Answer:   2    Order Specific Question:   Keep 02 saturation    Answer:   greater than 92 %  . I-Stat CG4 Lactic Acid, ED    Standing Status:   Standing    Number of Occurrences:   2  . ED EKG 12-Lead    Standing Status:   Standing    Number of Occurrences:   1    Order Specific Question:   Reason for Exam    Answer:   Baseline  . EKG  . Type and screen [place]     Standing Status:   Standing    Number of Occurrences:   1  .  Prepare RBC    Standing Status:   Standing    Number of Occurrences:   1    Order Specific Question:   # of Units    Answer:   2 units    Order Specific Question:   Transfusion Indications    Answer:   Other (Specify)    Order Specific Question:   If emergent release call blood bank    Answer:   Not emergent release  . Prepare Pheresed Platelets    Standing Status:   Standing    Number of Occurrences:   1    Order Specific Question:   Number of Apheresis Units    Answer:   1 unit (6-10 packs)    Order Specific Question:   Transfusion Indications    Answer:   PLT Count </=10,000/mm    Order Specific Question:   Special Requirements    Answer:   CMV/Leukoreduced    Order Specific Question:   Special Requirements    Answer:   Irradiated    Order Specific Question:   If emergent  release call blood bank    Answer:   Forestine Na [phone]  . Routine line care    Standing Status:   Standing    Number of Occurrences:   17  . Admit to Inpatient (patient's expected length of stay will be greater than 2 midnights or inpatient only procedure)    Standing Status:   Standing    Number of Occurrences:   1    Order Specific Question:   Hospital Area    Answer:   [facility] [[id number]]    Order Specific Question:   Level of Care    Answer:   Telemetry [5]    Order Specific Question:   Diagnosis    Answer:   Febrile neutropenia [facility]) [[id number]]    Order Specific Question:   Admitting Physician    Answer:   Reubin Milan [[id number]]    Order Specific Question:   Attending Physician    Answer:   Reubin Milan [[id number]]    Order Specific Question:   Estimated length of stay    Answer:   past midnight tomorrow    Order Specific Question:   Certification:    Answer:   I certify this patient will need inpatient services for at least 2 midnights    Order Specific Question:   PT Class (Do Not Modify)    Answer:   Inpatient [101]    Order Specific Question:   PT Acc Code (Do Not  Modify)    Answer:   Private [1]      Assessment and Plan:  1.  AML.  Pt recently seen at [facility] in 06/2018.  He is undergoing transplant consideration.    2.  Neutropenic fever.  BC + Ecoli.  Have spoken with pharmacist regarding abx coverage.  Pt currently on Meropenem,Diflucan.   Will add ACV 400 mg po bid.  Will continue to monitor cultures and fevers. WBC 0.1.  Pt was recently treated with growth factor.    3.  Rigors.   Will give Demerol 25 mg IV times 1.  Monitor for any recurrent episodes.  Likely due to infection.    4.  Thrombocytopenia.  Plt decreased at 13,000.  Will transfuse 1 u LR/irradiated plts today.  Continue to follow counts.   5.  Anemia.  HB improved at 8.8 after transfusion.  Will transfuse if HB 7 or less.

## 2018-07-24 NOTE — Progress Notes (Signed)
MD made aware of Pt Temp of 105.1(Rectally) and HR being >150's and had went up to 190's. Given pt tylenol and IV demerol. Pt no longer shaking. Pt on cooling blanket and extra ice packs. Pt changed back to stepdown status.  Jatniel Verastegui Rica Mote, RN

## 2018-07-24 NOTE — Progress Notes (Signed)
PROGRESS NOTE    Bryce Hamilton  NFA:[id number] DOB: 07-Nov-1966 DOA: 07/22/2018 PCP: Mikey Kirschner, MD     Brief Narrative:  52 year old man admitted from home on 9/8 due to febrile neutropenia.  He was diagnosed with AML in March and currently undergoes treatment at [facility].  His last chemo treatment was over Labor Day weekend.  He had a temperature of 103 at home and was instructed to come to his nearest emergency department.  Blood cultures have subsequently become positive for gram-negative rods, BC ID with E. coli.  Oncology is on board.   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 coli bacteremia.  Will discontinue vancomycin, will switch over to meropenem given high incidence of ESBL E. coli at [facility] pending culture sensitivities. -Last temp was at midnight of 100.4. -Still remains significantly neutropenic with a total WBC count of 0.1.  Severe pancytopenia due to chemotherapy -As above, significantly neutropenic.  Oncology is on board to determine feasibility of G-CSF. -Hemoglobin was 6.9 on 9/9, is 8.8 today after transfusion of 2 units of PRBCs. -Platelet count is down to 13 today.  We will plan to transfuse if less than 10 or signs of bleeding, which he does not have today.  AML -Will need to follow-up with his oncology team at [facility] once stable for discharge.   DVT prophylaxis: SCDs Code Status: Full code Family Communication: Patient only Disposition Plan: Pending medical stability  Consultants:   Oncology  Procedures:   None  Antimicrobials:  Anti-infectives (From admission, onward)   Start     Dose/Rate Route Frequency  Ordered Stop   07/24/18 1000  meropenem (MERREM) 1 g in sodium chloride 0.9 % 100 mL IVPB     1 g 200 mL/hr over 30 Minutes Intravenous Every 8 hours 07/24/18 0829     07/23/18 1000  acyclovir (ZOVIRAX) tablet 400 mg     400 mg Oral 2 times daily 07/23/18 0334     07/23/18 1000  fluconazole (DIFLUCAN) tablet 200 mg     200 mg Oral Daily 07/23/18 0334     07/23/18 1000  levofloxacin (LEVAQUIN) tablet 500 mg  Status:  Discontinued     500 mg Oral Daily 07/23/18 0334 07/23/18 1005   07/23/18 1000  ceFEPIme (MAXIPIME) 2 g in sodium chloride 0.9 % 100 mL IVPB  Status:  Discontinued     2 g 200 mL/hr over 30 Minutes Intravenous Every 8 hours 07/23/18 0812 07/24/18 0812   07/23/18 1000  vancomycin (VANCOCIN) 1,250 mg in sodium chloride 0.9 % 250 mL IVPB  Status:  Discontinued     1,250 mg 166.7 mL/hr over 90 Minutes Intravenous Every 12 hours 07/23/18 0830 07/24/18 0812   07/23/18 0115  vancomycin (VANCOCIN) IVPB 1000 mg/200 mL premix     1,000 mg 200 mL/hr over 60 Minutes Intravenous  Once 07/23/18 0107 07/23/18 0328   07/23/18 0100  ceFEPIme (MAXIPIME) 2 g in sodium chloride 0.9 % 100 mL IVPB     2 g 200 mL/hr over 30 Minutes Intravenous  Once 07/23/18 0058 07/23/18 0155   07/23/18 0100  metroNIDAZOLE (FLAGYL) IVPB 500 mg     500 mg 100  mL/hr over 60 Minutes Intravenous Every 8 hours 07/23/18 0058     07/23/18 0100  vancomycin (VANCOCIN) IVPB 1000 mg/200 mL premix     1,000 mg 200 mL/hr over 60 Minutes Intravenous  Once 07/23/18 0058 07/23/18 0227       Subjective: Lying in bed, no complaints.  Specifically no right upper quadrant pain or urinary symptoms.  Objective: Vitals:   07/24/18 0700 07/24/18 0728 07/24/18 0800 07/24/18 0900  BP: (!) 131/96  129/85 123/76  Pulse: 81 72 72 74  Resp: 18 17 (!) 28 17  Temp:  98.1 F (36.7 C)    TempSrc:  Oral    SpO2: 99% 100% 100% 100%  Weight:      Height:        Intake/Output Summary (Last 24 hours) at 07/24/2018 0947 Last data  filed at 07/24/2018 0532 Gross per 24 hour  Intake 947.5 ml  Output 3600 ml  Net -2652.5 ml   Filed Weights   07/22/18 2348 07/23/18 0322 07/24/18 0400  Weight: 95.3 kg 95.9 kg 97.7 kg    Examination:  General exam: Alert, awake, oriented x 3 Respiratory system: Clear to auscultation. Respiratory effort normal. Cardiovascular system:RRR. No murmurs, rubs, gallops. Gastrointestinal system: Abdomen is nondistended, soft and nontender. No organomegaly or masses felt. Normal bowel sounds heard. Central nervous system: Alert and oriented. No focal neurological deficits. Extremities: No C/C/E, +pedal pulses Skin: No rashes, lesions or ulcers Psychiatry: Judgement and insight appear normal. Mood & affect appropriate.     Data Reviewed: I have personally reviewed following labs and imaging studies  CBC: Recent Labs  Lab 07/19/18 1141 07/23/18 0014 07/23/18 0853 07/24/18 0422  WBC 4.1 <0.1* 0.1* 0.1*  NEUTROABS 4.0  --   --  TOO FEW TO COUNT, SMEAR AVAILABLE FOR REVIEW  HGB 8.8* 7.3* 6.9* 8.8*  HCT 26.8* 21.9* 20.5* 26.0*  MCV 97.5 95.6 94.5 92.9  PLT 121* 27* 20* 13*   Basic Metabolic Panel: Recent Labs  Lab 07/19/18 1141 07/23/18 0014 07/23/18 0114 07/23/18 0853 07/24/18 0422  NA 140 137  --  136 138  K 3.8 3.5  --  3.4* 3.6  CL 106 105  --  106 108  CO2 25 24  --  27 24  GLUCOSE 116* 124*  --  118* 109*  BUN 14 18  --  14 12  CREATININE 0.85 1.02  --  0.90 0.90  CALCIUM 9.2 9.1  --  8.3* 8.4*  MG 2.0  --  1.5*  --   --   PHOS  --   --  3.6  --   --    GFR: Estimated Creatinine Clearance: 116.8 mL/min (by C-G formula based on SCr of 0.9 mg/dL). Liver Function Tests: Recent Labs  Lab 07/19/18 1141 07/23/18 0014 07/23/18 0853 07/24/18 0422  AST 23 21 17 20   ALT 33 25 21 27   ALKPHOS 65 56 48 51  BILITOT 1.6* 1.7* 1.1 1.3*  PROT 7.5 7.0 6.4* 6.6  ALBUMIN 3.6 3.6 3.1* 3.2*   No results for input(s): LIPASE, AMYLASE in the last 168 hours. No results for  input(s): AMMONIA in the last 168 hours. Coagulation Profile: Recent Labs  Lab 07/23/18 0014  INR 1.28   Cardiac Enzymes: No results for input(s): CKTOTAL, CKMB, CKMBINDEX, TROPONINI in the last 168 hours. BNP (last 3 results) No results for input(s): PROBNP in the last 8760 hours. HbA1C: No results for input(s): HGBA1C in the last 72 hours. CBG: No  results for input(s): GLUCAP in the last 168 hours. Lipid Profile: No results for input(s): CHOL, HDL, LDLCALC, TRIG, CHOLHDL, LDLDIRECT in the last 72 hours. Thyroid Function Tests: No results for input(s): TSH, T4TOTAL, FREET4, T3FREE, THYROIDAB in the last 72 hours. Anemia Panel: No results for input(s): VITAMINB12, FOLATE, FERRITIN, TIBC, IRON, RETICCTPCT in the last 72 hours. Urine analysis:    Component Value Date/Time   COLORURINE STRAW (A) 07/23/2018 0100   APPEARANCEUR CLEAR 07/23/2018 0100   LABSPEC 1.013 07/23/2018 0100   PHURINE 5.0 07/23/2018 0100   GLUCOSEU NEGATIVE 07/23/2018 0100   HGBUR NEGATIVE 07/23/2018 0100   BILIRUBINUR NEGATIVE 07/23/2018 0100   KETONESUR NEGATIVE 07/23/2018 0100   PROTEINUR NEGATIVE 07/23/2018 0100   UROBILINOGEN 0.2 10/26/2009 1728   NITRITE NEGATIVE 07/23/2018 0100   LEUKOCYTESUR NEGATIVE 07/23/2018 0100   Sepsis Labs: @LABRCNTIP (procalcitonin:4,lacticidven:4)  ) Recent Results (from the past 240 hour(s))  Culture, blood (Routine x 2)     Status: None (Preliminary result)   Collection Time: 07/23/18 12:00 AM  Result Value Ref Range Status   Specimen Description   Final    PORTA CATH Performed at [facility], [address] [place], [place] [address]    Special Requests   Final    BOTTLES DRAWN AEROBIC AND ANAEROBIC Blood Culture adequate volume Performed at [facility], [address], [place], [place] [address]    Culture  Setup Time   Final    GRAM NEGATIVE RODS BOTH BOTTLES POSITIVE Gram Stain Report Called to,Read Back By and Verified With: BARBARA MORRIS 07/23/18 @ 1320  LLS Organism ID to follow CRITICAL RESULT CALLED TO, READ BACK BY AND VERIFIED WITHMiquel Dunn RN 07/23/18 2044 JDW Performed at [facility], [place] [address], [place], [place] [address]    Culture GRAM NEGATIVE RODS  Final   Report Status PENDING  Incomplete  Blood Culture ID Panel (Reflexed)     Status: Abnormal   Collection Time: 07/23/18 12:00 AM  Result Value Ref Range Status   Enterococcus species NOT DETECTED NOT DETECTED Final   Listeria monocytogenes NOT DETECTED NOT DETECTED Final   Staphylococcus species NOT DETECTED NOT DETECTED Final   Staphylococcus aureus NOT DETECTED NOT DETECTED Final   Streptococcus species NOT DETECTED NOT DETECTED Final   Streptococcus agalactiae NOT DETECTED NOT DETECTED Final   Streptococcus pneumoniae NOT DETECTED NOT DETECTED Final   Streptococcus pyogenes NOT DETECTED NOT DETECTED Final   Acinetobacter baumannii NOT DETECTED NOT DETECTED Final   Enterobacteriaceae species DETECTED (A) NOT DETECTED Final    Comment: Enterobacteriaceae represent a large family of gram-negative bacteria, not a single organism. CRITICAL RESULT CALLED TO, READ BACK BY AND VERIFIED WITH: Miquel Dunn RN 07/23/18 2044 JDW    Enterobacter cloacae complex NOT DETECTED NOT DETECTED Final   Escherichia coli DETECTED (A) NOT DETECTED Final    Comment: CRITICAL RESULT CALLED TO, READ BACK BY AND VERIFIED WITH: Miquel Dunn RN 07/23/18 2044 JDW    Klebsiella oxytoca NOT DETECTED NOT DETECTED Final   Klebsiella pneumoniae NOT DETECTED NOT DETECTED Final   Proteus species NOT DETECTED NOT DETECTED Final   Serratia marcescens NOT DETECTED NOT DETECTED Final   Carbapenem resistance NOT DETECTED NOT DETECTED Final   Haemophilus influenzae NOT DETECTED NOT DETECTED Final   Neisseria meningitidis NOT DETECTED NOT DETECTED Final   Pseudomonas aeruginosa NOT DETECTED NOT DETECTED Final   Candida albicans NOT DETECTED NOT DETECTED Final   Candida glabrata NOT DETECTED NOT DETECTED Final  Candida krusei NOT DETECTED NOT DETECTED Final   Candida parapsilosis NOT DETECTED NOT DETECTED Final   Candida tropicalis NOT DETECTED NOT DETECTED Final    Comment: Performed at [facility], [place] [address], [place], [place] [address]  Culture, blood (Routine x 2)     Status: None (Preliminary result)   Collection Time: 07/23/18 12:17 AM  Result Value Ref Range Status   Specimen Description   Final    BLOOD LEFT HAND Performed at [facility] [place], [address] [place], [place] [address]    Special Requests   Final    BOTTLES DRAWN AEROBIC AND ANAEROBIC Blood Culture adequate volume Performed at [facility], [address] [place], [place] [address]    Culture  Setup Time   Final    GRAM NEGATIVE RODS BOTH BOTTES OF SET Gram Stain Report Called to,Read Back By and Verified With: B MORRIS 07/23/18 @ 1320 LLS CRITICAL RESULT CALLED TO, READ BACK BY AND VERIFIED WITHMiquel Dunn RN 07/23/18 2044 JDW Performed at [facility], [place] [address] [place], [place] [address]    Culture GRAM NEGATIVE RODS  Final   Report Status PENDING  Incomplete  MRSA PCR Screening     Status: None   Collection Time: 07/23/18  2:37 AM  Result Value Ref Range Status   MRSA by PCR NEGATIVE NEGATIVE Final    Comment:        The GeneXpert MRSA Assay (FDA approved for NASAL specimens only), is one component of a comprehensive MRSA colonization surveillance program. It is not intended to diagnose MRSA infection nor to guide or monitor treatment for MRSA infections. Performed at [facility], [address] [place], [place] [address]          Radiology Studies: Dg Chest 2 View  Result Date: 07/23/2018 CLINICAL DATA:  Fever starting tonight, finished chemotherapy last week, history leukemia EXAM: CHEST - 2 VIEW COMPARISON:  01/21/2018 FINDINGS: RIGHT jugular Port-A-Cath with tip projecting over SVC near cavoatrial junction. Normal heart size, mediastinal contours, and pulmonary vascularity. Lungs  clear. No pleural effusion or pneumothorax. Bones unremarkable. IMPRESSION: No acute abnormalities. Electronically Signed   By: Lavonia Dana M.D.   On: 07/23/2018 01:07        Scheduled Meds: . sodium chloride   Intravenous Once  . acetaminophen  650 mg Oral Once  . acyclovir  400 mg Oral BID  . Chlorhexidine Gluconate Cloth  6 each Topical Daily  . fluconazole  200 mg Oral Daily  . pantoprazole  40 mg Oral Daily  . sodium chloride flush  10-40 mL Intracatheter Q12H   Continuous Infusions: . sodium chloride 125 mL/hr at 07/24/18 0936  . meropenem (MERREM) IV    . metronidazole 500 mg (07/24/18 0940)     LOS: 1 day    Time spent: 35 minutes. Greater than 50% of this time was spent in direct contact with the patient, coordinating care and discussing relevant ongoing clinical issues, including positive blood cultures, plan to switch antibiotic therapy, plan to closely monitor blood counts.     Lelon Frohlich, MD [facility] Pager [phone]  If 7PM-7AM, please contact night-coverage [email or web address] Password [facility] 07/24/2018, 9:47 AM

## 2018-07-24 NOTE — Progress Notes (Signed)
Pharmacy Antibiotic Note  Bryce Hamilton is a 52 y.o. male admitted on 07/22/2018 with febrile neutropenia/E. Coli in blood.  Pharmacy has been consulted for Vancomycin and Cefepime dosing.  Plan: Meropenem 1000 mg IV every 8 hours Metronidazole 500 mg IV every 8 hours Monitor labs, c/s, and patient improvement.  Height: 6\' 1"  (185.4 cm) Weight: 215 lb 6.2 oz (97.7 kg) IBW/kg (Calculated) : 79.9  Temp (24hrs), Avg:100.3 F (37.9 C), Min:97.6 F (36.4 C), Max:103.5 F (39.7 C)  Recent Labs  Lab 07/19/18 1141 07/23/18 0014 07/23/18 0030 07/23/18 0853 07/24/18 0422  WBC 4.1 <0.1*  --  0.1* 0.1*  CREATININE 0.85 1.02  --  0.90 0.90  LATICACIDVEN  --   --  1.08  --   --     Estimated Creatinine Clearance: 116.8 mL/min (by C-G formula based on SCr of 0.9 mg/dL).    No Known Allergies  Antimicrobials this admission: Meropenem 9/10 >> Vanco 9/9 >> 9/10 Cefepime 9/9 >>9/10 Flagyl 9/9 >>  Dose adjustments this admission: N/A  Microbiology results: 9/9 BCx: E. coli 9/9 MRSA PCR: negative  Thank you for allowing pharmacy to be a part of this patient's care.  Ramond Craver 07/24/2018 8:32 AM

## 2018-07-24 NOTE — Progress Notes (Signed)
PHARMACY - PHYSICIAN COMMUNICATION CRITICAL VALUE ALERT - BLOOD CULTURE IDENTIFICATION (BCID)  Bryce Hamilton is an 52 y.o. male who presented to [facility] on 07/22/2018 with a chief complaint of febrile neutropenia  Assessment:  E. Coli bactermia   Name of physician (or Provider) Contacted: Dr. Jerilee Hoh  Current antibiotics: Vanco/Cefepime/Flagyl  Changes to prescribed antibiotics recommended:  Recommendations accepted by provider- Meropenem 1000 mg IV every 8 hours- Discontinue Cefepime, Vanco, and Metronidazole  Results for orders placed or performed during the hospital encounter of 07/22/18  Blood Culture ID Panel (Reflexed) (Collected: 07/23/2018 12:00 AM)  Result Value Ref Range   Enterococcus species NOT DETECTED NOT DETECTED   Listeria monocytogenes NOT DETECTED NOT DETECTED   Staphylococcus species NOT DETECTED NOT DETECTED   Staphylococcus aureus NOT DETECTED NOT DETECTED   Streptococcus species NOT DETECTED NOT DETECTED   Streptococcus agalactiae NOT DETECTED NOT DETECTED   Streptococcus pneumoniae NOT DETECTED NOT DETECTED   Streptococcus pyogenes NOT DETECTED NOT DETECTED   Acinetobacter baumannii NOT DETECTED NOT DETECTED   Enterobacteriaceae species DETECTED (A) NOT DETECTED   Enterobacter cloacae complex NOT DETECTED NOT DETECTED   Escherichia coli DETECTED (A) NOT DETECTED   Klebsiella oxytoca NOT DETECTED NOT DETECTED   Klebsiella pneumoniae NOT DETECTED NOT DETECTED   Proteus species NOT DETECTED NOT DETECTED   Serratia marcescens NOT DETECTED NOT DETECTED   Carbapenem resistance NOT DETECTED NOT DETECTED   Haemophilus influenzae NOT DETECTED NOT DETECTED   Neisseria meningitidis NOT DETECTED NOT DETECTED   Pseudomonas aeruginosa NOT DETECTED NOT DETECTED   Candida albicans NOT DETECTED NOT DETECTED   Candida glabrata NOT DETECTED NOT DETECTED   Candida krusei NOT DETECTED NOT DETECTED   Candida parapsilosis NOT DETECTED NOT DETECTED   Candida tropicalis  NOT DETECTED NOT DETECTED    Ramond Craver 07/24/2018  11:04 AM

## 2018-07-25 DIAGNOSIS — D709 Neutropenia, unspecified: Secondary | ICD-10-CM

## 2018-07-25 DIAGNOSIS — R5081 Fever presenting with conditions classified elsewhere: Secondary | ICD-10-CM

## 2018-07-25 LAB — CBC WITH DIFFERENTIAL/PLATELET
HCT: 25.1 % — ABNORMAL LOW (ref 39.0–52.0)
Hemoglobin: 8.7 g/dL — ABNORMAL LOW (ref 13.0–17.0)
MCH: 31.6 pg (ref 26.0–34.0)
MCHC: 34.7 g/dL (ref 30.0–36.0)
MCV: 91.3 fL (ref 78.0–100.0)
PLATELETS: 22 10*3/uL — AB (ref 150–400)
RBC: 2.75 MIL/uL — ABNORMAL LOW (ref 4.22–5.81)
RDW: 15.3 % (ref 11.5–15.5)
WBC: 0.1 10*3/uL — CL (ref 4.0–10.5)

## 2018-07-25 LAB — PREPARE PLATELET PHERESIS: Unit division: 0

## 2018-07-25 LAB — BPAM PLATELET PHERESIS
Blood Product Expiration Date: 201909112359
ISSUE DATE / TIME: 201909101722
UNIT TYPE AND RH: 5100

## 2018-07-25 LAB — HEPATIC FUNCTION PANEL
ALBUMIN: 2.9 g/dL — AB (ref 3.5–5.0)
ALT: 25 U/L (ref 0–44)
AST: 18 U/L (ref 15–41)
Alkaline Phosphatase: 47 U/L (ref 38–126)
Bilirubin, Direct: 0.1 mg/dL (ref 0.0–0.2)
Indirect Bilirubin: 0.7 mg/dL (ref 0.3–0.9)
Total Bilirubin: 0.8 mg/dL (ref 0.3–1.2)
Total Protein: 6.5 g/dL (ref 6.5–8.1)

## 2018-07-25 LAB — CREATININE, SERUM: Creatinine, Ser: 0.77 mg/dL (ref 0.61–1.24)

## 2018-07-25 NOTE — Progress Notes (Signed)
Pt has been without fever for 24 hours and has been feeling much today than he has felt since admission. WBC still very low at 0.1 so MD suggest to keep him at stepdown level and reevaluate tomorrow. Pt is now on protective and contact precautions as of today since he tested positive for ESBL. Patient has been off of cooling blanket for regulation and monitoring of tempetures.   Ericka Pontiff, RN 07/25/18 5:41 PM

## 2018-07-25 NOTE — Progress Notes (Signed)
Diagnosis Neutropenic fever ([place])  Pancytopenia ([place])  Staging Cancer Staging No matching staging information was found for the patient.  Subjective:Pt reports he feels better today.  No chills since yesterday.    Objective: Vitals Patient Vitals for the past 24 hrs:  BP Temp Temp src Pulse Resp SpO2 Weight  07/25/18 1700 - - - 75 17 100 % -  07/25/18 1600 - 99.1 F (37.3 C) Oral 76 (!) 27 99 % -  07/25/18 1500 - - - 92 20 98 % -  07/25/18 1400 - - - (!) 58 (!) 22 99 % -  07/25/18 1300 - - - 87 16 100 % -  07/25/18 1200 - - - 74 19 100 % -  07/25/18 1100 - 98.3 F (36.8 C) Oral 83 18 99 % -  07/25/18 1056 - - - - - 99 % -  07/25/18 1000 - - - 70 18 100 % -  07/25/18 0900 - - - 68 19 100 % -  07/25/18 0800 - - - 78 (!) 22 99 % -  07/25/18 0700 - 99.3 F (37.4 C) Rectal 81 20 100 % -  07/25/18 0600 - - - 75 20 99 % -  07/25/18 0400 (!) 150/90 99 F (37.2 C) Rectal 74 20 99 % 212 lb 8.4 oz (96.4 kg)  07/25/18 0300 (!) 145/87 - - 69 16 99 % -  07/25/18 0200 135/81 - - 69 (!) 21 99 % -  07/25/18 0100 (!) 141/85 - - 71 (!) 23 100 % -  07/25/18 0000 120/74 98.2 F (36.8 C) Rectal 70 (!) 9 98 % -  07/24/18 2300 137/90 - - 69 17 99 % -  07/24/18 2200 (!) 140/95 - - 63 14 100 % -  07/24/18 2100 (!) 149/81 - - (!) 59 18 100 % -  07/24/18 2000 (!) 142/83 98.3 F (36.8 C) Rectal 61 18 100 % -  07/24/18 1900 (!) 143/86 - - (!) 58 (!) 28 99 % -    Physical Exam  Constitutional: Well-developed, well-nourished, and in no distress.   HENT:Head: Normocephalic and atraumatic.  Mouth/Throat: No oropharyngeal exudate. Mucosa moist. Eyes: Pupils are equal, round, and reactive to light. Conjunctivae are normal. No scleral icterus.  Neck: Normal range of motion. Neck supple. No JVD present.  Cardiovascular: Normal rate, regular rhythm and normal heart sounds.  Exam reveals no gallop and no friction rub.   No murmur heard. Pulmonary/Chest: Effort normal and breath sounds normal. No  respiratory distress. No wheezes.No rales.  Abdominal: Soft. Bowel sounds are normal. No distension. There is no tenderness. There is no guarding.  Musculoskeletal: No edema or tenderness.  Lymphadenopathy: No cervical, axillary or supraclavicular adenopathy.  Neurological: Alert and oriented to person, place, and time. No cranial nerve deficit.  Skin: Skin is warm and dry. No rash noted. No erythema. No pallor.  Psychiatric: Affect and judgment normal.    Medications:  Reviewed   Allergies Patient has no known allergies.  Review of Systems Review of Systems - Oncology ROS negative   Labs No results displayed because visit has over 200 results.     CBC WBC 0.1 Hb 8.7 plts 22,000.  Chemistries WNL with Cr 0.77, normal LFTs.    Pathology Orders Placed This Encounter  Procedures  . Culture, blood (Routine x 2)    Standing Status:   Standing    Number of Occurrences:   2  . MRSA PCR Screening    Standing  Status:   Standing    Number of Occurrences:   1  . Blood Culture ID Panel (Reflexed)    Standing Status:   Standing    Number of Occurrences:   1  . DG Chest 2 View    Standing Status:   Standing    Number of Occurrences:   1    Order Specific Question:   Reason for Exam (SYMPTOM  OR DIAGNOSIS REQUIRED)    Answer:   fever,  . Comprehensive metabolic panel    Standing Status:   Standing    Number of Occurrences:   1  . CBC with Differential    Standing Status:   Standing    Number of Occurrences:   1  . Protime-INR    Standing Status:   Standing    Number of Occurrences:   1  . Urinalysis, Routine w reflex microscopic    Standing Status:   Standing    Number of Occurrences:   1  . CBC with Differential    Standing Status:   Standing    Number of Occurrences:   17  . Basic metabolic panel    Standing Status:   Standing    Number of Occurrences:   2  . Hepatic function panel    Standing Status:   Standing    Number of Occurrences:   17  . Magnesium     Standing Status:   Standing    Number of Occurrences:   1  . Phosphorus    Standing Status:   Standing    Number of Occurrences:   1  . Creatinine, serum    Standing Status:   Standing    Number of Occurrences:   4  . CBC    Standing Status:   Standing    Number of Occurrences:   1  . Differential    Standing Status:   Standing    Number of Occurrences:   17  . Diet Heart Room service appropriate? Yes; Fluid consistency: Thin    Standing Status:   Standing    Number of Occurrences:   1    Order Specific Question:   Room service appropriate?    Answer:   Yes    Order Specific Question:   Fluid consistency:    Answer:   Thin  . Notify Physician if pt is possible Sepsis patient    Standing Status:   Standing    Number of Occurrences:   1  . Document Actual / Estimated Weight    Standing Status:   Standing    Number of Occurrences:   1  . Insert / maintain saline lock    Standing Status:   Standing    Number of Occurrences:   1  . Document vital signs within 1-hour of fluid bolus completion and notify provider of bolus completion    Standing Status:   Standing    Number of Occurrences:   1  . Cardiac Monitoring Continuous x 12 hours Indications for use: Other; other indications for use: Sepsis with tachycardia    Standing Status:   Standing    Number of Occurrences:   1    Order Specific Question:   Indications for use:    Answer:   Other    Order Specific Question:   other indications for use:    Answer:   Sepsis with tachycardia  . Vital signs    Standing Status:   Standing  Number of Occurrences:   1  . Notify physician    Standing Status:   Standing    Number of Occurrences:   20    Order Specific Question:   Notify Physician    Answer:   for pulse less than 55 or greater than 120    Order Specific Question:   Notify Physician    Answer:   for respiratory rate less than 12 or greater than 25    Order Specific Question:   Notify Physician    Answer:   for  temperature greater than 100.5 F    Order Specific Question:   Notify Physician    Answer:   for urinary output less than 30 mL/kg/hr for four hours    Order Specific Question:   Notify Physician    Answer:   for systolic BP less than 90 or greater than 696, diastolic BP less than 60 or greater than 100  . Initiate Oral Care Protocol    Standing Status:   Standing    Number of Occurrences:   1  . Initiate Carrier Fluid Protocol    Standing Status:   Standing    Number of Occurrences:   1  . RN may order General Admission PRN Orders utilizing "General Admission PRN medications" (through manage orders) for the following patient needs: allergy symptoms (Claritin), cold sores (Carmex), cough (Robitussin DM), eye irritation (Liquifilm Tears), hemorrhoids (Tucks), indigestion (Maalox), minor skin irritation (Hydrocortisone Cream), muscle pain Suezanne Jacquet Gay), nose irritation (saline nasal spray) and sore throat (Chloraseptic spray).    Standing Status:   Standing    Number of Occurrences:   [id number]  . SCDs    Standing Status:   Standing    Number of Occurrences:   1    Order Specific Question:   Laterality    Answer:   Bilateral  . Patient has an active order for admit to inpatient/place in observation    Standing Status:   Standing    Number of Occurrences:   1  . Up with assistance    Standing Status:   Standing    Number of Occurrences:   [id number]  . Apply cooling blanket    Standing Status:   Standing    Number of Occurrences:   1  . Practitioner attestation of consent    I, the ordering practitioner, attest that I have discussed with the patient the benefits, risks, side effects, alternatives, likelihood of achieving goals and potential problems during recovery for the procedure listed.    Standing Status:   Standing    Number of Occurrences:   1    Order Specific Question:   Procedure    Answer:   Blood product(s)  . Complete patient signature process for consent form    Standing Status:    Standing    Number of Occurrences:   1  . CBC post transfusion - RN to place lab order with appropriate draw time    Standing Status:   Standing    Number of Occurrences:   1  . Apply neutral pressure cap    Standing Status:   Standing    Number of Occurrences:   20  . May draw labs from central line    Standing Status:   Standing    Number of Occurrences:   20  . For first catheter occlusion notify IV team ([place], [place]) or provider at all other sites.    Standing Status:   Standing  Number of Occurrences:   1  . Practitioner attestation of consent    I, the ordering practitioner, attest that I have discussed with the patient the benefits, risks, side effects, alternatives, likelihood of achieving goals and potential problems during recovery for the procedure listed.    Standing Status:   Standing    Number of Occurrences:   1    Order Specific Question:   Procedure    Answer:   Blood product(s)  . Complete patient signature process for consent form    Standing Status:   Standing    Number of Occurrences:   1  . Full code    Standing Status:   Standing    Number of Occurrences:   1  . Consult to hospitalist    Standing Status:   Standing    Number of Occurrences:   1    Order Specific Question:   Place call to:    Answer:   [employer] photographer Question:   Reason for Consult    Answer:   Admit  . 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    Answer:   Neutropenic fever, pancytopenia, AML  . Meropenem per pharmacyconsult    Standing Status:   Standing    Number of Occurrences:   1    Order Specific Question:   Antibiotic Indication:    Answer:    Febrile Neutropenia  . Contact precautions    Standing Status:   Standing    Number of Occurrences:   1  . Oxygen therapy Mode or (Route): Nasal cannula; Liters Per Minute: 2; Keep 02 saturation: greater than 92 %    Standing Status:   Standing    Number of Occurrences:   1    Order Specific Question:   Mode or (Route)    Answer:   Nasal cannula    Order Specific Question:   Liters Per Minute    Answer:   2    Order Specific Question:   Keep 02 saturation    Answer:   greater than 92 %  . I-Stat CG4 Lactic Acid, ED    Standing Status:   Standing    Number of Occurrences:   2  . ED EKG 12-Lead    Standing Status:   Standing    Number of Occurrences:   1    Order Specific Question:   Reason for Exam    Answer:   Baseline  . EKG  . Type and screen [place]     Standing Status:   Standing    Number of Occurrences:   1  . Prepare RBC    Standing Status:   Standing    Number of Occurrences:   1    Order Specific Question:   # of Units    Answer:   2 units    Order Specific Question:   Transfusion Indications    Answer:   Other (Specify)    Order Specific Question:   If emergent release call blood bank    Answer:  Not emergent release  . Prepare Pheresed Platelets    Standing Status:   Standing    Number of Occurrences:   1    Order Specific Question:   Number of Apheresis Units    Answer:   1 unit (6-10 packs)    Order Specific Question:   Transfusion Indications    Answer:   PLT Count </=10,000/mm    Order Specific Question:   Special Requirements    Answer:   CMV/Leukoreduced    Order Specific Question:   Special Requirements    Answer:   Irradiated    Order Specific Question:   If emergent release call blood bank    Answer:   [facility] [phone]  . Routine line care    Standing Status:   Standing    Number of Occurrences:   33  . Admit to Inpatient (patient's expected length of stay will be greater than 2 midnights or inpatient  only procedure)    Standing Status:   Standing    Number of Occurrences:   1    Order Specific Question:   Hospital Area    Answer:   [facility] [[id number]]    Order Specific Question:   Level of Care    Answer:   Telemetry [5]    Order Specific Question:   Diagnosis    Answer:   Febrile neutropenia [facility]) [[id number]]    Order Specific Question:   Admitting Physician    Answer:   Reubin Milan [[id number]]    Order Specific Question:   Attending Physician    Answer:   Reubin Milan [[id number]]    Order Specific Question:   Estimated length of stay    Answer:   past midnight tomorrow    Order Specific Question:   Certification:    Answer:   I certify this patient will need inpatient services for at least 2 midnights    Order Specific Question:   PT Class (Do Not Modify)    Answer:   Inpatient [101]    Order Specific Question:   PT Acc Code (Do Not Modify)    Answer:   Private [1]  . 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  Have notified them  of [place].   2. Neutropenic fever.  Pt afebrile 24 hrs.  Feeling better today.  Pt on Meropenem/Diflucan/ACV.  BC + for Ecoli.   Sensitivities noted.  Will continue to monitor for fevers.  Pt was last treated with growth factor on 07/17/2018.  Will continue  to follow counts.    3.  Rigors.  Improved with Demerol.  No further episodes.    4.  Diarrhea.  He reports mild case.  Have requested nursing monitor for any increase in episodes.    5.  Nutrition.  Encouraged po intake.  Have discussed with nursing.

## 2018-07-25 NOTE — Progress Notes (Signed)
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  He was diagnosed with AML in March and currently undergoes treatment at [facility].  His last chemo treatment was over Labor Day weekend.  He had a temperature of 103 at home and was instructed to come to his nearest emergency department.  Blood cultures with ESBL E. Coli.  oncology is on board.   Assessment & Plan:    1)E. coli ESBL bacteremia in the setting of febrile neutropenia- severe and persistent leukopenia WBC 0.1, continue IV meropenem, patient is now afebrile   2)Severe pancytopenia due to chemotherapy -platelet count up to 22 after transfusion, hemoglobin up to 8.7 after transfusion, white count remains at only 0.1, denies bleeding at this time, Oncology is on board to determine feasibility of G-CSF.   3)AML-last chemo 07/13/2018, patient apparently got Neulasta on 07/17/2018, usually follows with oncologist at [facility]  Disposition/Need for in-Hospital Stay- patient unable to be discharged at this time due to severe and persistent leukopenia WBC 0.1 and E. coli ESBL bacteremia requiring IV meropenem    DVT prophylaxis: SCDs Code Status: Full code Family Communication: Patient only  Consultants:   Oncology  Procedures:   Transfusion  Antimicrobials:  Anti-infectives (From admission, onward)   Start     Dose/Rate Route Frequency Ordered Stop   07/24/18 2200  acyclovir (ZOVIRAX) tablet 400 mg  Status:  Discontinued     400 mg Oral 2 times daily  07/24/18 1302 07/24/18 1306   07/24/18 2200  acyclovir (ZOVIRAX) tablet 400 mg     400 mg Oral 2 times daily 07/24/18 1304     07/24/18 1000  meropenem (MERREM) 1 g in sodium chloride 0.9 % 100 mL IVPB     1 g 200 mL/hr over 30 Minutes Intravenous Every 8 hours 07/24/18 0829     07/23/18 1000  acyclovir (ZOVIRAX) tablet 400 mg  Status:  Discontinued     400 mg Oral 2 times daily 07/23/18 0334 07/24/18 1107   07/23/18 1000  fluconazole (DIFLUCAN) tablet 200 mg     200 mg Oral Daily 07/23/18 0334     07/23/18 1000  levofloxacin (LEVAQUIN) tablet 500 mg  Status:  Discontinued     500 mg Oral Daily 07/23/18 0334 07/23/18 1005   07/23/18 1000  ceFEPIme (MAXIPIME) 2 g in sodium chloride 0.9 % 100 mL IVPB  Status:  Discontinued     2 g 200 mL/hr over 30 Minutes Intravenous Every 8 hours 07/23/18 0812 07/24/18 0812   07/23/18 1000  vancomycin (VANCOCIN) 1,250 mg in sodium chloride 0.9 % 250 mL IVPB  Status:  Discontinued     1,250 mg 166.7 mL/hr over 90 Minutes Intravenous Every 12 hours 07/23/18 0830 07/24/18 0812   07/23/18 0115  vancomycin (VANCOCIN) IVPB 1000 mg/200 mL premix     1,000 mg 200 mL/hr over 60 Minutes Intravenous  Once 07/23/18 0107 07/23/18 0328   07/23/18 0100  ceFEPIme (MAXIPIME) 2 g in sodium chloride 0.9 % 100 mL IVPB  2 g 200 mL/hr over 30 Minutes Intravenous  Once 07/23/18 0058 07/23/18 0155   07/23/18 0100  metroNIDAZOLE (FLAGYL) IVPB 500 mg  Status:  Discontinued     500 mg 100 mL/hr over 60 Minutes Intravenous Every 8 hours 07/23/18 0058 07/24/18 1107   07/23/18 0100  vancomycin (VANCOCIN) IVPB 1000 mg/200 mL premix     1,000 mg 200 mL/hr over 60 Minutes Intravenous  Once 07/23/18 0058 07/23/18 0227       Subjective: Lying in bed, no complaints.  Specifically no right upper quadrant pain or urinary symptoms.  Objective: Vitals:   07/25/18 1400 07/25/18 1500 07/25/18 1600 07/25/18 1700  BP:      Pulse: (!) 58 92 76 75  Resp: (!) 22 20 (!) 27 17  Temp:    99.1 F (37.3 C)   TempSrc:   Oral   SpO2: 99% 98% 99% 100%  Weight:      Height:        Intake/Output Summary (Last 24 hours) at 07/25/2018 1759 Last data filed at 07/25/2018 1405 Gross per 24 hour  Intake 3832.72 ml  Output 1550 ml  Net 2282.72 ml   Filed Weights   07/23/18 0322 07/24/18 0400 07/25/18 0400  Weight: 95.9 kg 97.7 kg 96.4 kg    Examination:  General exam: Alert, awake, oriented x 3 Neck- Right-sided Jugular Port-A-Cath site is clean dry and intact  Respiratory system: Clear to auscultation. Respiratory effort normal. Cardiovascular system:RRR.   gastrointestinal system: Abdomen is nondistended, soft and nontender. No organomegaly or masses felt. Normal bowel sounds heard. Central nervous system: Alert and oriented. No focal neurological deficits. Extremities: No C/C/E, +pedal pulses Skin: No rashes, lesions or ulcers Psychiatry: Judgement and insight appear normal. Mood & affect appropriate.   CBC: Recent Labs  Lab 07/19/18 1141 07/23/18 0014 07/23/18 0853 07/24/18 0422 07/25/18 0403  WBC 4.1 <0.1* 0.1* 0.1* 0.1*  NEUTROABS 4.0  --   --  TOO FEW TO COUNT, SMEAR AVAILABLE FOR REVIEW TOO FEW TO COUNT, SMEAR AVAILABLE FOR REVIEW  HGB 8.8* 7.3* 6.9* 8.8* 8.7*  HCT 26.8* 21.9* 20.5* 26.0* 25.1*  MCV 97.5 95.6 94.5 92.9 91.3  PLT 121* 27* 20* 13* 22*   Basic Metabolic Panel: Recent Labs  Lab 07/19/18 1141 07/23/18 0014 07/23/18 0114 07/23/18 0853 07/24/18 0422 07/25/18 0403  NA 140 137  --  136 138  --   K 3.8 3.5  --  3.4* 3.6  --   CL 106 105  --  106 108  --   CO2 25 24  --  27 24  --   GLUCOSE 116* 124*  --  118* 109*  --   BUN 14 18  --  14 12  --   CREATININE 0.85 1.02  --  0.90 0.90 0.77  CALCIUM 9.2 9.1  --  8.3* 8.4*  --   MG 2.0  --  1.5*  --   --   --   PHOS  --   --  3.6  --   --   --    GFR: Estimated Creatinine Clearance: 130.7 mL/min (by C-G formula based on SCr of 0.77 mg/dL). Liver Function Tests: Recent Labs  Lab  07/19/18 1141 07/23/18 0014 07/23/18 0853 07/24/18 0422 07/25/18 0403  AST 23 21 17 20 18   ALT 33 25 21 27 25   ALKPHOS 65 56 48 51 47  BILITOT 1.6* 1.7* 1.1 1.3* 0.8  PROT 7.5 7.0 6.4* 6.6 6.5  ALBUMIN 3.6 3.6 3.1* 3.2* 2.9*   No results for input(s): LIPASE, AMYLASE in the last 168 hours. No results for input(s): AMMONIA in the last 168 hours. Coagulation Profile: Recent Labs  Lab 07/23/18 0014  INR 1.28   Cardiac Enzymes: No results for input(s): CKTOTAL, CKMB, CKMBINDEX, TROPONINI in the last 168 hours. BNP (last 3 results) No results for input(s): PROBNP in the last 8760 hours. HbA1C: No results for input(s): HGBA1C in the last 72 hours. CBG: No results for input(s): GLUCAP in the last 168 hours. Lipid Profile: No results for input(s): CHOL, HDL, LDLCALC, TRIG, CHOLHDL, LDLDIRECT in the last 72 hours. Thyroid Function Tests: No results for input(s): TSH, T4TOTAL, FREET4, T3FREE, THYROIDAB in the last 72 hours. Anemia Panel: No results for input(s): VITAMINB12, FOLATE, FERRITIN, TIBC, IRON, RETICCTPCT in the last 72 hours. Urine analysis:    Component Value Date/Time   COLORURINE STRAW (A) 07/23/2018 0100   APPEARANCEUR CLEAR 07/23/2018 0100   LABSPEC 1.013 07/23/2018 0100   PHURINE 5.0 07/23/2018 0100   GLUCOSEU NEGATIVE 07/23/2018 0100   HGBUR NEGATIVE 07/23/2018 0100   BILIRUBINUR NEGATIVE 07/23/2018 0100   KETONESUR NEGATIVE 07/23/2018 0100   PROTEINUR NEGATIVE 07/23/2018 0100   UROBILINOGEN 0.2 10/26/2009 1728   NITRITE NEGATIVE 07/23/2018 0100   LEUKOCYTESUR NEGATIVE 07/23/2018 0100   Sepsis Labs: @LABRCNTIP (procalcitonin:4,lacticidven:4)  ) Recent Results (from the past 240 hour(s))  Culture, blood (Routine x 2)     Status: Abnormal (Preliminary result)   Collection Time: 07/23/18 12:00 AM  Result Value Ref Range Status   Specimen Description   Final    PORTA CATH Performed at [facility], [address] [place], [place] [address]    Special  Requests   Final    BOTTLES DRAWN AEROBIC AND ANAEROBIC Blood Culture adequate volume Performed at [facility], [address], [place], [place] [address]    Culture  Setup Time   Final    GRAM NEGATIVE RODS BOTH BOTTLES POSITIVE Gram Stain Report Called to,Read Back By and Verified With: BARBARA MORRIS 07/23/18 @ 1320 LLS Organism ID to follow CRITICAL RESULT CALLED TO, READ BACK BY AND VERIFIED WITHMiquel Dunn RN 07/23/18 2044 JDW Performed at [facility], [place] [address], [place], [place] [address]    Culture (A)  Final    ESCHERICHIA COLI SUSCEPTIBILITIES PERFORMED ON PREVIOUS CULTURE WITHIN THE LAST 5 DAYS.    Report Status PENDING  Incomplete  Blood Culture ID Panel (Reflexed)     Status: Abnormal   Collection Time: 07/23/18 12:00 AM  Result Value Ref Range Status   Enterococcus species NOT DETECTED NOT DETECTED Final   Listeria monocytogenes NOT DETECTED NOT DETECTED Final   Staphylococcus species NOT DETECTED NOT DETECTED Final   Staphylococcus aureus NOT DETECTED NOT DETECTED Final   Streptococcus species NOT DETECTED NOT DETECTED Final   Streptococcus agalactiae NOT DETECTED NOT DETECTED Final   Streptococcus pneumoniae NOT DETECTED NOT DETECTED Final   Streptococcus pyogenes NOT DETECTED NOT DETECTED Final   Acinetobacter baumannii NOT DETECTED NOT DETECTED Final   Enterobacteriaceae species DETECTED (A) NOT DETECTED Final    Comment: Enterobacteriaceae represent a large family of gram-negative bacteria, not a single organism. CRITICAL RESULT CALLED TO, READ BACK BY AND VERIFIED WITH: Miquel Dunn RN 07/23/18 2044 JDW    Enterobacter cloacae complex NOT DETECTED NOT DETECTED Final   Escherichia coli DETECTED (A) NOT DETECTED Final    Comment: CRITICAL RESULT CALLED TO, READ BACK BY AND VERIFIED WITH:  Miquel Dunn RN 07/23/18 2044 JDW    Klebsiella oxytoca NOT DETECTED NOT DETECTED Final   Klebsiella pneumoniae NOT DETECTED NOT DETECTED Final   Proteus species NOT DETECTED NOT  DETECTED Final   Serratia marcescens NOT DETECTED NOT DETECTED Final   Carbapenem resistance NOT DETECTED NOT DETECTED Final   Haemophilus influenzae NOT DETECTED NOT DETECTED Final   Neisseria meningitidis NOT DETECTED NOT DETECTED Final   Pseudomonas aeruginosa NOT DETECTED NOT DETECTED Final   Candida albicans NOT DETECTED NOT DETECTED Final   Candida glabrata NOT DETECTED NOT DETECTED Final   Candida krusei NOT DETECTED NOT DETECTED Final   Candida parapsilosis NOT DETECTED NOT DETECTED Final   Candida tropicalis NOT DETECTED NOT DETECTED Final    Comment: Performed at [facility], [place] [address] [place], [place] [address]  Culture, blood (Routine x 2)     Status: Abnormal (Preliminary result)   Collection Time: 07/23/18 12:17 AM  Result Value Ref Range Status   Specimen Description   Final    BLOOD LEFT HAND Performed at [facility], [address], [place], [place] [address]    Special Requests   Final    BOTTLES DRAWN AEROBIC AND ANAEROBIC Blood Culture adequate volume Performed at [facility], [address] [place], [place] [address]    Culture  Setup Time   Final    GRAM NEGATIVE RODS BOTH BOTTES OF SET Gram Stain Report Called to,Read Back By and Verified With: B MORRIS 07/23/18 @ 1320 LLS CRITICAL RESULT CALLED TO, READ BACK BY AND VERIFIED WITHMiquel Dunn RN 07/23/18 2044 JDW Performed at [facility], [place] [address], [place], [place] [address]    Culture (A)  Final    ESCHERICHIA COLI Confirmed Extended Spectrum Beta-Lactamase Producer (ESBL).  In bloodstream infections from ESBL organisms, carbapenems are preferred over piperacillin/tazobactam. They are shown to have a lower risk of mortality.    Report Status PENDING  Incomplete   Organism ID, Bacteria ESCHERICHIA COLI  Final      Susceptibility   Escherichia coli - MIC*    AMPICILLIN >=32 RESISTANT Resistant     CEFAZOLIN >=64 RESISTANT Resistant     CEFEPIME 32 RESISTANT Resistant     CEFTAZIDIME  RESISTANT Resistant     CEFTRIAXONE >=64 RESISTANT Resistant     CIPROFLOXACIN >=4 RESISTANT Resistant     GENTAMICIN >=16 RESISTANT Resistant     IMIPENEM <=0.25 SENSITIVE Sensitive     TRIMETH/SULFA 40 SENSITIVE Sensitive     AMPICILLIN/SULBACTAM >=32 RESISTANT Resistant     PIP/TAZO 8 SENSITIVE Sensitive     Extended ESBL POSITIVE Resistant     * ESCHERICHIA COLI  MRSA PCR Screening     Status: None   Collection Time: 07/23/18  2:37 AM  Result Value Ref Range Status   MRSA by PCR NEGATIVE NEGATIVE Final    Comment:        The GeneXpert MRSA Assay (FDA approved for NASAL specimens only), is one component of a comprehensive MRSA colonization surveillance program. It is not intended to diagnose MRSA infection nor to guide or monitor treatment for MRSA infections. Performed at [facility], [address], [place], [place] [address]          Radiology Studies: No results found.      Scheduled Meds: . sodium chloride   Intravenous Once  . acetaminophen  650 mg Oral Once  . acyclovir  400 mg Oral BID  . Chlorhexidine Gluconate Cloth  6 each Topical Daily  . fluconazole  200 mg Oral Daily  . pantoprazole  40 mg Oral Daily  . sodium chloride flush  10-40 mL Intracatheter Q12H   Continuous Infusions: . sodium chloride 125 mL/hr at 07/25/18 1405  . meropenem (MERREM) IV 1 g (07/25/18 1757)     LOS: 2 days    Roxan Hockey, MD [facility]   If 7PM-7AM, please contact night-coverage [email or web address] Password [facility] 07/25/2018, 5:59 PM

## 2018-07-26 ENCOUNTER — Other Ambulatory Visit (HOSPITAL_COMMUNITY): Payer: BC Managed Care – PPO

## 2018-07-26 LAB — HEPATIC FUNCTION PANEL
ALK PHOS: 43 U/L (ref 38–126)
ALT: 21 U/L (ref 0–44)
AST: 13 U/L — ABNORMAL LOW (ref 15–41)
Albumin: 2.7 g/dL — ABNORMAL LOW (ref 3.5–5.0)
BILIRUBIN DIRECT: 0.1 mg/dL (ref 0.0–0.2)
BILIRUBIN TOTAL: 0.7 mg/dL (ref 0.3–1.2)
Indirect Bilirubin: 0.6 mg/dL (ref 0.3–0.9)
Total Protein: 6.4 g/dL — ABNORMAL LOW (ref 6.5–8.1)

## 2018-07-26 LAB — CULTURE, BLOOD (ROUTINE X 2)
SPECIAL REQUESTS: ADEQUATE
Special Requests: ADEQUATE

## 2018-07-26 NOTE — Progress Notes (Signed)
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  He was diagnosed with AML in March and currently undergoes treatment at [facility].  His last chemo treatment was over Labor Day weekend.  He had a temperature of 103 at home and was instructed to come to his nearest emergency department.  Blood cultures with ESBL E. Coli.  oncology is on board.   Assessment & Plan:    1)E. coli ESBL bacteremia in the setting of febrile neutropenia- severe and persistent leukopenia WBC 0.1, continue IV meropenem, T max 101.9 around midnight, T current 100  2)Severe pancytopenia due to chemotherapy -platelet count up to 22 after transfusion, hemoglobin up to 8.7 after transfusion, white count remains at only 0.1, denies bleeding at this time, Oncology is on board to determine feasibility of G-CSF.  Continue acyclovir and Diflucan  3)AML-last chemo 07/13/2018, patient apparently got Neulasta on 07/17/2018, usually follows with oncologist at [facility]  Disposition/Need for in-Hospital Stay- patient unable to be discharged at this time due to severe and persistent leukopenia WBC 0.1 and E. coli ESBL bacteremia requiring IV meropenem, continues to have fevers in the setting of severe neutropenia/leukopenia    DVT prophylaxis: SCDs Code Status: Full code Family Communication: Patient only  Consultants:   Oncology  Procedures:   Transfusion  Antimicrobials:  Anti-infectives (From admission, onward)   Start     Dose/Rate Route Frequency  Ordered Stop   07/24/18 2200  acyclovir (ZOVIRAX) tablet 400 mg  Status:  Discontinued     400 mg Oral 2 times daily 07/24/18 1302 07/24/18 1306   07/24/18 2200  acyclovir (ZOVIRAX) tablet 400 mg     400 mg Oral 2 times daily 07/24/18 1304     07/24/18 1000  meropenem (MERREM) 1 g in sodium chloride 0.9 % 100 mL IVPB     1 g 200 mL/hr over 30 Minutes Intravenous Every 8 hours 07/24/18 0829     07/23/18 1000  acyclovir (ZOVIRAX) tablet 400 mg  Status:  Discontinued     400 mg Oral 2 times daily 07/23/18 0334 07/24/18 1107   07/23/18 1000  fluconazole (DIFLUCAN) tablet 200 mg     200 mg Oral Daily 07/23/18 0334     07/23/18 1000  levofloxacin (LEVAQUIN) tablet 500 mg  Status:  Discontinued     500 mg Oral Daily 07/23/18 0334 07/23/18 1005   07/23/18 1000  ceFEPIme (MAXIPIME) 2 g in sodium chloride 0.9 % 100 mL IVPB  Status:  Discontinued     2 g 200 mL/hr over 30 Minutes Intravenous Every 8 hours 07/23/18 0812 07/24/18 0812   07/23/18 1000  vancomycin (VANCOCIN) 1,250 mg in sodium chloride 0.9 % 250 mL IVPB  Status:  Discontinued     1,250 mg 166.7 mL/hr over 90 Minutes Intravenous Every 12 hours 07/23/18 0830 07/24/18 0812   07/23/18 0115  vancomycin (VANCOCIN) IVPB 1000 mg/200 mL premix     1,000 mg 200 mL/hr over 60 Minutes Intravenous  Once 07/23/18 0107 07/23/18 0328   07/23/18 0100  ceFEPIme (MAXIPIME) 2 g in sodium chloride 0.9 % 100 mL IVPB     2 g 200 mL/hr over 30 Minutes Intravenous  Once 07/23/18 0058 07/23/18 0155   07/23/18 0100  metroNIDAZOLE (FLAGYL) IVPB 500 mg  Status:  Discontinued     500 mg 100 mL/hr over 60 Minutes Intravenous Every 8 hours 07/23/18 0058 07/24/18 1107   07/23/18 0100  vancomycin (VANCOCIN) IVPB 1000 mg/200 mL premix     1,000 mg 200 mL/hr over 60 Minutes Intravenous  Once 07/23/18 0058 07/23/18 0227       Subjective:  T max 101.9 around midnight, T current 100, 2 episodes of loose stools over the last 24 hours, no vomiting, denies abdominal  pain no chest pain or productive cough  Objective: Vitals:   07/26/18 0900 07/26/18 1000 07/26/18 1100 07/26/18 1147  BP: (!) 155/77 98/86    Pulse: 95 (!) 119 (!) 101   Resp: (!) 30 18 18    Temp:    100 F (37.8 C)  TempSrc:    Oral  SpO2: 96% 98% 100%   Weight:      Height:        Intake/Output Summary (Last 24 hours) at 07/26/2018 1453 Last data filed at 07/26/2018 1145 Gross per 24 hour  Intake 2778.5 ml  Output 2775 ml  Net 3.5 ml   Filed Weights   07/24/18 0400 07/25/18 0400 07/26/18 0500  Weight: 97.7 kg 96.4 kg 95.2 kg    Examination:  Patient is examined daily including today on 07/26/18 , exams remain the same as of yesterday except that has changed    General exam: Alert, awake, oriented x 3 Neck- Right-sided Jugular Port-A-Cath site is clean dry and intact  Respiratory system: Clear to auscultation. Respiratory effort normal. Cardiovascular system:RRR.   gastrointestinal system: Abdomen is nondistended, soft and nontender. No organomegaly or masses felt. Normal bowel sounds heard. Central nervous system: Alert and oriented. No focal neurological deficits. Extremities: No C/C/E, +pedal pulses Skin: No rashes, lesions or ulcers Psychiatry: Judgement and insight appear normal. Mood & affect appropriate.   CBC: Recent Labs  Lab 07/23/18 0014 07/23/18 0853 07/24/18 0422 07/25/18 0403 07/26/18 0419  WBC <0.1* 0.1* 0.1* 0.1* 0.1*  NEUTROABS  --   --  TOO FEW TO COUNT, SMEAR AVAILABLE FOR REVIEW TOO FEW TO COUNT, SMEAR AVAILABLE FOR REVIEW TOO FEW TO COUNT, SMEAR AVAILABLE FOR REVIEW  HGB 7.3* 6.9* 8.8* 8.7* 8.4*  HCT 21.9* 20.5* 26.0* 25.1* 24.8*  MCV 95.6 94.5 92.9 91.3 91.5  PLT 27* 20* 13* 22* 26*   Basic Metabolic Panel: Recent Labs  Lab 07/23/18 0014 07/23/18 0114 07/23/18 0853 07/24/18 0422 07/25/18 0403  NA 137  --  136 138  --   K 3.5  --  3.4* 3.6  --   CL 105  --  106 108  --   CO2 24  --  27 24  --   GLUCOSE 124*  --  118* 109*  --     BUN 18  --  14 12  --   CREATININE 1.02  --  0.90 0.90 0.77  CALCIUM 9.1  --  8.3* 8.4*  --   MG  --  1.5*  --   --   --   PHOS  --  3.6  --   --   --    GFR: Estimated Creatinine Clearance: 120.7 mL/min (by C-G formula based on SCr of 0.77 mg/dL). Liver Function Tests: Recent  Labs  Lab 07/23/18 0014 07/23/18 0853 07/24/18 0422 07/25/18 0403 07/26/18 0419  AST 21 17 20 18  13*  ALT 25 21 27 25 21   ALKPHOS 56 48 51 47 43  BILITOT 1.7* 1.1 1.3* 0.8 0.7  PROT 7.0 6.4* 6.6 6.5 6.4*  ALBUMIN 3.6 3.1* 3.2* 2.9* 2.7*   No results for input(s): LIPASE, AMYLASE in the last 168 hours. No results for input(s): AMMONIA in the last 168 hours. Coagulation Profile: Recent Labs  Lab 07/23/18 0014  INR 1.28   Cardiac Enzymes: No results for input(s): CKTOTAL, CKMB, CKMBINDEX, TROPONINI in the last 168 hours. BNP (last 3 results) No results for input(s): PROBNP in the last 8760 hours. HbA1C: No results for input(s): HGBA1C in the last 72 hours. CBG: No results for input(s): GLUCAP in the last 168 hours. Lipid Profile: No results for input(s): CHOL, HDL, LDLCALC, TRIG, CHOLHDL, LDLDIRECT in the last 72 hours. Thyroid Function Tests: No results for input(s): TSH, T4TOTAL, FREET4, T3FREE, THYROIDAB in the last 72 hours. Anemia Panel: No results for input(s): VITAMINB12, FOLATE, FERRITIN, TIBC, IRON, RETICCTPCT in the last 72 hours. Urine analysis:    Component Value Date/Time   COLORURINE STRAW (A) 07/23/2018 0100   APPEARANCEUR CLEAR 07/23/2018 0100   LABSPEC 1.013 07/23/2018 0100   PHURINE 5.0 07/23/2018 0100   GLUCOSEU NEGATIVE 07/23/2018 0100   HGBUR NEGATIVE 07/23/2018 0100   BILIRUBINUR NEGATIVE 07/23/2018 0100   KETONESUR NEGATIVE 07/23/2018 0100   PROTEINUR NEGATIVE 07/23/2018 0100   UROBILINOGEN 0.2 10/26/2009 1728   NITRITE NEGATIVE 07/23/2018 0100   LEUKOCYTESUR NEGATIVE 07/23/2018 0100   Sepsis Labs: @LABRCNTIP (procalcitonin:4,lacticidven:4)  ) Recent Results  (from the past 240 hour(s))  Culture, blood (Routine x 2)     Status: Abnormal   Collection Time: 07/23/18 12:00 AM  Result Value Ref Range Status   Specimen Description   Final    PORTA CATH Performed at [facility], [address] [place], [place] [address]    Special Requests   Final    BOTTLES DRAWN AEROBIC AND ANAEROBIC Blood Culture adequate volume Performed at [facility], [address], [place], [place] [address]    Culture  Setup Time   Final    GRAM NEGATIVE RODS BOTH BOTTLES POSITIVE Gram Stain Report Called to,Read Back By and Verified With: BARBARA MORRIS 07/23/18 @ 1320 LLS CRITICAL RESULT CALLED TO, READ BACK BY AND VERIFIED WITHMiquel Dunn RN 07/23/18 2044 JDW Performed at [facility], [place] [address], [place], [place] [address]    Culture (A)  Final    ESCHERICHIA COLI SUSCEPTIBILITIES PERFORMED ON PREVIOUS CULTURE WITHIN THE LAST 5 DAYS.    Report Status 07/26/2018 FINAL  Final  Blood Culture ID Panel (Reflexed)     Status: Abnormal   Collection Time: 07/23/18 12:00 AM  Result Value Ref Range Status   Enterococcus species NOT DETECTED NOT DETECTED Final   Listeria monocytogenes NOT DETECTED NOT DETECTED Final   Staphylococcus species NOT DETECTED NOT DETECTED Final   Staphylococcus aureus NOT DETECTED NOT DETECTED Final   Streptococcus species NOT DETECTED NOT DETECTED Final   Streptococcus agalactiae NOT DETECTED NOT DETECTED Final   Streptococcus pneumoniae NOT DETECTED NOT DETECTED Final   Streptococcus pyogenes NOT DETECTED NOT DETECTED Final   Acinetobacter baumannii NOT DETECTED NOT DETECTED Final   Enterobacteriaceae species DETECTED (A) NOT DETECTED Final    Comment: Enterobacteriaceae represent a large family of gram-negative bacteria, not a single organism. CRITICAL RESULT CALLED TO, READ BACK  BY AND VERIFIED WITH: Miquel Dunn RN 07/23/18 2044 JDW    Enterobacter cloacae complex NOT DETECTED NOT DETECTED Final   Escherichia coli DETECTED (A) NOT  DETECTED Final    Comment: CRITICAL RESULT CALLED TO, READ BACK BY AND VERIFIED WITH: Miquel Dunn RN 07/23/18 2044 JDW    Klebsiella oxytoca NOT DETECTED NOT DETECTED Final   Klebsiella pneumoniae NOT DETECTED NOT DETECTED Final   Proteus species NOT DETECTED NOT DETECTED Final   Serratia marcescens NOT DETECTED NOT DETECTED Final   Carbapenem resistance NOT DETECTED NOT DETECTED Final   Haemophilus influenzae NOT DETECTED NOT DETECTED Final   Neisseria meningitidis NOT DETECTED NOT DETECTED Final   Pseudomonas aeruginosa NOT DETECTED NOT DETECTED Final   Candida albicans NOT DETECTED NOT DETECTED Final   Candida glabrata NOT DETECTED NOT DETECTED Final   Candida krusei NOT DETECTED NOT DETECTED Final   Candida parapsilosis NOT DETECTED NOT DETECTED Final   Candida tropicalis NOT DETECTED NOT DETECTED Final    Comment: Performed at [facility], [place] [address] [place], [place] [address]  Culture, blood (Routine x 2)     Status: Abnormal   Collection Time: 07/23/18 12:17 AM  Result Value Ref Range Status   Specimen Description   Final    BLOOD LEFT HAND Performed at [facility], [address], [place], [place] [address]    Special Requests   Final    BOTTLES DRAWN AEROBIC AND ANAEROBIC Blood Culture adequate volume Performed at [facility], [address], [place], [place] [address]    Culture  Setup Time   Final    GRAM NEGATIVE RODS BOTH BOTTES OF SET Gram Stain Report Called to,Read Back By and Verified With: B MORRIS 07/23/18 @ 1320 LLS CRITICAL RESULT CALLED TO, READ BACK BY AND VERIFIED WITHMiquel Dunn RN 07/23/18 2044 JDW Performed at [facility], [place] [address], [place], [place] [address]    Culture (A)  Final    ESCHERICHIA COLI Confirmed Extended Spectrum Beta-Lactamase Producer (ESBL).  In bloodstream infections from ESBL organisms, carbapenems are preferred over piperacillin/tazobactam. They are shown to have a lower risk of mortality.    Report Status 07/26/2018  FINAL  Final   Organism ID, Bacteria ESCHERICHIA COLI  Final      Susceptibility   Escherichia coli - MIC*    AMPICILLIN >=32 RESISTANT Resistant     CEFAZOLIN >=64 RESISTANT Resistant     CEFEPIME 32 RESISTANT Resistant     CEFTAZIDIME RESISTANT Resistant     CEFTRIAXONE >=64 RESISTANT Resistant     CIPROFLOXACIN >=4 RESISTANT Resistant     GENTAMICIN >=16 RESISTANT Resistant     IMIPENEM <=0.25 SENSITIVE Sensitive     TRIMETH/SULFA 40 SENSITIVE Sensitive     AMPICILLIN/SULBACTAM >=32 RESISTANT Resistant     PIP/TAZO 8 SENSITIVE Sensitive     Extended ESBL POSITIVE Resistant     * ESCHERICHIA COLI  MRSA PCR Screening     Status: None   Collection Time: 07/23/18  2:37 AM  Result Value Ref Range Status   MRSA by PCR NEGATIVE NEGATIVE Final    Comment:        The GeneXpert MRSA Assay (FDA approved for NASAL specimens only), is one component of a comprehensive MRSA colonization surveillance program. It is not intended to diagnose MRSA infection nor to guide or monitor treatment for MRSA infections. Performed at [facility], [address] [place], [place] [address]  Radiology Studies: No results found.      Scheduled Meds: . sodium chloride   Intravenous Once  . acetaminophen  650 mg Oral Once  . acyclovir  400 mg Oral BID  . Chlorhexidine Gluconate Cloth  6 each Topical Daily  . fluconazole  200 mg Oral Daily  . pantoprazole  40 mg Oral Daily  . sodium chloride flush  10-40 mL Intracatheter Q12H   Continuous Infusions: . sodium chloride 125 mL/hr at 07/26/18 1145  . meropenem (MERREM) IV Stopped (07/26/18 0947)     LOS: 3 days    Roxan Hockey, MD [facility]   If 7PM-7AM, please contact night-coverage [email or web address] Password [facility] 07/26/2018, 2:53 PM

## 2018-07-27 DIAGNOSIS — R7881 Bacteremia: Secondary | ICD-10-CM

## 2018-07-27 LAB — URINALYSIS, ROUTINE W REFLEX MICROSCOPIC
Bacteria, UA: NONE SEEN
Bilirubin Urine: NEGATIVE
GLUCOSE, UA: NEGATIVE mg/dL
Ketones, ur: NEGATIVE mg/dL
Leukocytes, UA: NEGATIVE
Nitrite: NEGATIVE
PROTEIN: NEGATIVE mg/dL
Specific Gravity, Urine: 1.01 (ref 1.005–1.030)
pH: 7 (ref 5.0–8.0)

## 2018-07-27 LAB — CBC WITH DIFFERENTIAL/PLATELET
HCT: 22.2 % — ABNORMAL LOW (ref 39.0–52.0)
HEMATOCRIT: 24.8 % — AB (ref 39.0–52.0)
HEMOGLOBIN: 8.4 g/dL — AB (ref 13.0–17.0)
Hemoglobin: 7.5 g/dL — ABNORMAL LOW (ref 13.0–17.0)
MCH: 31 pg (ref 26.0–34.0)
MCH: 30.6 pg (ref 26.0–34.0)
MCHC: 33.9 g/dL (ref 30.0–36.0)
MCHC: 33.8 g/dL (ref 30.0–36.0)
MCV: 91.5 fL (ref 78.0–100.0)
MCV: 90.6 fL (ref 78.0–100.0)
PLATELETS: 11 10*3/uL — AB (ref 150–400)
Platelets: 26 10*3/uL — CL (ref 150–400)
RBC: 2.71 MIL/uL — ABNORMAL LOW (ref 4.22–5.81)
RBC: 2.45 MIL/uL — ABNORMAL LOW (ref 4.22–5.81)
RDW: 14.9 % (ref 11.5–15.5)
RDW: 14.5 % (ref 11.5–15.5)
WBC: 0.1 10*3/uL — CL (ref 4.0–10.5)
WBC: 0.1 10*3/uL — CL (ref 4.0–10.5)

## 2018-07-27 LAB — BASIC METABOLIC PANEL
ANION GAP: 7 (ref 5–15)
BUN: 9 mg/dL (ref 6–20)
CALCIUM: 8.1 mg/dL — AB (ref 8.9–10.3)
CO2: 26 mmol/L (ref 22–32)
Chloride: 104 mmol/L (ref 98–111)
Creatinine, Ser: 0.73 mg/dL (ref 0.61–1.24)
Glucose, Bld: 103 mg/dL — ABNORMAL HIGH (ref 70–99)
POTASSIUM: 2.8 mmol/L — AB (ref 3.5–5.1)
Sodium: 137 mmol/L (ref 135–145)

## 2018-07-27 LAB — HEPATIC FUNCTION PANEL
ALT: 30 U/L (ref 0–44)
AST: 24 U/L (ref 15–41)
Albumin: 2.5 g/dL — ABNORMAL LOW (ref 3.5–5.0)
Alkaline Phosphatase: 44 U/L (ref 38–126)
BILIRUBIN DIRECT: 0.1 mg/dL (ref 0.0–0.2)
BILIRUBIN INDIRECT: 1 mg/dL — AB (ref 0.3–0.9)
TOTAL PROTEIN: 6.1 g/dL — AB (ref 6.5–8.1)
Total Bilirubin: 1.1 mg/dL (ref 0.3–1.2)

## 2018-07-27 LAB — PREPARE RBC (CROSSMATCH)

## 2018-07-27 MED ORDER — SODIUM CHLORIDE 0.9 % IV SOLN
1.0000 g | Freq: Three times a day (TID) | INTRAVENOUS | Status: DC
  Administered 2018-07-27 – 2018-07-30 (×9): 1 g via INTRAVENOUS
  Filled 2018-07-27 (×12): qty 1

## 2018-07-27 MED ORDER — POTASSIUM CHLORIDE CRYS ER 20 MEQ PO TBCR
40.0000 meq | EXTENDED_RELEASE_TABLET | Freq: Once | ORAL | Status: AC
  Administered 2018-07-27: 40 meq via ORAL
  Filled 2018-07-27: qty 2

## 2018-07-27 MED ORDER — FILGRASTIM 480 MCG/1.6ML IJ SOLN
480.0000 ug | Freq: Every day | INTRAMUSCULAR | Status: DC
  Administered 2018-07-27 – 2018-07-29 (×3): 480 ug via SUBCUTANEOUS
  Filled 2018-07-27 (×5): qty 1.6

## 2018-07-27 MED ORDER — SODIUM CHLORIDE 0.9% IV SOLUTION: Freq: Once | INTRAVENOUS | Status: DC

## 2018-07-27 MED ORDER — FUROSEMIDE 10 MG/ML IJ SOLN
40.0000 mg | Freq: Once | INTRAMUSCULAR | Status: AC
  Administered 2018-07-27: 40 mg via INTRAVENOUS
  Filled 2018-07-27: qty 4

## 2018-07-27 NOTE — Progress Notes (Signed)
Diagnosis Neutropenic fever (Bryce Hamilton)  Pancytopenia (Bryce Hamilton)  Staging Cancer Staging No matching staging information was found for the patient.  Subjective:  Pt doing well today.  Denies diarrhea.    Objective: Vitals Patient Vitals for the past 24 hrs:  BP Temp Temp src Pulse Resp SpO2 Weight  07/27/18 1825 120/78 99.5 F (37.5 C) Oral 98 (!) 24 97 % -  07/27/18 1800 120/78 99.5 F (37.5 C) Oral 96 (!) 23 97 % -  07/27/18 1400 - - - 95 (!) 27 95 % -  07/27/18 1300 - - - 92 (!) 32 95 % -  07/27/18 1200 - - - 84 (!) 27 100 % -  07/27/18 1000 128/83 - - (!) 108 17 95 % -  07/27/18 0900 133/78 - - 98 (!) 25 96 % -  07/27/18 0800 (!) 137/93 - - 96 19 97 % -  07/27/18 0700 131/87 - - (!) 102 (!) 23 95 % -  07/27/18 0600 129/68 - - (!) 107 (!) 27 94 % -  07/27/18 0524 - 99.4 F (37.4 C) Oral - - - -  07/27/18 0500 (!) 133/95 - - 69 (!) 38 96 % 204 lb 12.9 oz (92.9 kg)  07/27/18 0426 - (!) 101.2 F (38.4 C) - (!) 108 20 94 % -  07/27/18 0400 134/85 - - 95 (!) 26 94 % -  07/27/18 0300 135/79 - - (!) 106 20 95 % -  07/27/18 0200 - - - (!) 118 20 95 % -  07/27/18 0100 - - - 97 (!) 22 97 % -  07/27/18 0017 - 99.8 F (37.7 C) Oral 96 (!) 25 93 % -  07/27/18 0000 - - - 91 (!) 21 94 % -  07/26/18 2300 - - - 100 (!) 26 94 % -  07/26/18 2200 - - - 97 (!) 26 95 % -  07/26/18 2129 - (!) 100.8 F (38.2 C) Oral (!) 104 (!) 28 95 % -  07/26/18 2100 - - - (!) 131 (!) 22 98 % -  07/26/18 2000 - (!) 102 F (38.9 C) Oral (!) 104 (!) 24 96 % -    Physical Exam  Constitutional: Well-developed, well-nourished, and in no distress.   HENT: Head: Normocephalic and atraumatic.  Mouth/Throat: No oropharyngeal exudate. Mucosa moist. Eyes: Pupils are equal, round, and reactive to light. Conjunctivae are normal. No scleral icterus.  Neck: Normal range of motion. Neck supple. No JVD present.  Cardiovascular: Normal rate, regular rhythm and normal heart sounds.  Exam reveals no gallop and no friction  rub.   No murmur heard. Pulmonary/Chest: Effort normal and breath sounds normal. No respiratory distress. No wheezes.No rales.  Abdominal: Soft. Bowel sounds are normal. No distension. There is no tenderness. There is no guarding.  Musculoskeletal: No edema or tenderness.  Lymphadenopathy: No cervical, axillary or supraclavicular adenopathy.  Neurological: Alert and oriented to person, place, and time. No cranial nerve deficit.  Skin: Skin is warm and dry. No rash noted. No erythema. No pallor.  Psychiatric: Affect and judgment normal.    Medications:  Reviewed   Allergies Patient has no known allergies.  Review of Systems Review of Systems - Oncology ROS Negative   Labs done 07/27/2018 reviewed  WBC 0.1 HB 7.5 Plts 11,000.  Chemistries K+ 2.8, Cr 0.73, normal LFTS.    BC + Ecoli from 07/23/2018   Pathology Orders Placed This Encounter  Procedures  . Culture, blood (Routine x 2)  Standing Status:   Standing    Number of Occurrences:   2  . MRSA PCR Screening    Standing Status:   Standing    Number of Occurrences:   1  . Blood Culture ID Panel (Reflexed)    Standing Status:   Standing    Number of Occurrences:   1  . Culture, blood (Routine X 2) w Reflex to ID Panel    Standing Status:   Standing    Number of Occurrences:   1    Order Specific Question:   Patient immune status    Answer:   Normal  . DG Chest 2 View    Standing Status:   Standing    Number of Occurrences:   1    Order Specific Question:   Reason for Exam (SYMPTOM  OR DIAGNOSIS REQUIRED)    Answer:   fever,  . Comprehensive metabolic panel    Standing Status:   Standing    Number of Occurrences:   1  . CBC with Differential    Standing Status:   Standing    Number of Occurrences:   1  . Protime-INR    Standing Status:   Standing    Number of Occurrences:   1  . Urinalysis, Routine w reflex microscopic    Standing Status:   Standing    Number of Occurrences:   1  . CBC with Differential     Standing Status:   Standing    Number of Occurrences:   17  . Basic metabolic panel    Standing Status:   Standing    Number of Occurrences:   2  . Hepatic function panel    Standing Status:   Standing    Number of Occurrences:   17  . Magnesium    Standing Status:   Standing    Number of Occurrences:   1  . Phosphorus    Standing Status:   Standing    Number of Occurrences:   1  . Creatinine, serum    Standing Status:   Standing    Number of Occurrences:   8  . CBC    Standing Status:   Standing    Number of Occurrences:   1  . Differential    Standing Status:   Standing    Number of Occurrences:   17  . CBC    Standing Status:   Standing    Number of Occurrences:   1    Order Specific Question:   Specimen collection method    Answer:   Lab=Lab collect  . Basic metabolic panel    Standing Status:   Standing    Number of Occurrences:   1    Order Specific Question:   Specimen collection method    Answer:   Lab=Lab collect  . Urinalysis, Routine w reflex microscopic    Standing Status:   Standing    Number of Occurrences:   1  . Diet regular Room service appropriate? Yes; Fluid consistency: Thin    Standing Status:   Standing    Number of Occurrences:   1    Order Specific Question:   Room service appropriate?    Answer:   Yes    Order Specific Question:   Fluid consistency:    Answer:   Thin  . Notify Physician if pt is possible Sepsis patient    Standing Status:   Standing    Number of Occurrences:  1  . Document Actual / Estimated Weight    Standing Status:   Standing    Number of Occurrences:   1  . Insert / maintain saline lock    Standing Status:   Standing    Number of Occurrences:   1  . Document vital signs within 1-hour of fluid bolus completion and notify provider of bolus completion    Standing Status:   Standing    Number of Occurrences:   1  . Cardiac Monitoring Continuous x 12 hours Indications for use: Other; other indications for use: Sepsis  with tachycardia    Standing Status:   Standing    Number of Occurrences:   1    Order Specific Question:   Indications for use:    Answer:   Other    Order Specific Question:   other indications for use:    Answer:   Sepsis with tachycardia  . Vital signs    Standing Status:   Standing    Number of Occurrences:   1  . Notify physician    Standing Status:   Standing    Number of Occurrences:   20    Order Specific Question:   Notify Physician    Answer:   for pulse less than 55 or greater than 120    Order Specific Question:   Notify Physician    Answer:   for respiratory rate less than 12 or greater than 25    Order Specific Question:   Notify Physician    Answer:   for temperature greater than 100.5 F    Order Specific Question:   Notify Physician    Answer:   for urinary output less than 30 mL/kg/hr for four hours    Order Specific Question:   Notify Physician    Answer:   for systolic BP less than 90 or greater than 503, diastolic BP less than 60 or greater than 100  . Initiate Oral Care Protocol    Standing Status:   Standing    Number of Occurrences:   1  . Initiate Carrier Fluid Protocol    Standing Status:   Standing    Number of Occurrences:   1  . RN may order General Admission PRN Orders utilizing "General Admission PRN medications" (through manage orders) for the following patient needs: allergy symptoms (Claritin), cold sores (Carmex), cough (Robitussin DM), eye irritation (Liquifilm Tears), hemorrhoids (Tucks), indigestion (Maalox), minor skin irritation (Hydrocortisone Cream), muscle pain Suezanne Jacquet Gay), nose irritation (saline nasal spray) and sore throat (Chloraseptic spray).    Standing Status:   Standing    Number of Occurrences:   [id number]  . SCDs    Standing Status:   Standing    Number of Occurrences:   1    Order Specific Question:   Laterality    Answer:   Bilateral  . Patient has an active order for admit to inpatient/place in observation    Standing Status:    Standing    Number of Occurrences:   1  . Up with assistance    Standing Status:   Standing    Number of Occurrences:   [id number]  . Apply cooling blanket    Standing Status:   Standing    Number of Occurrences:   1  . Practitioner attestation of consent    I, the ordering practitioner, attest that I have discussed with the patient the benefits, risks, side effects, alternatives, likelihood of achieving goals and potential  problems during recovery for the procedure listed.    Standing Status:   Standing    Number of Occurrences:   1    Order Specific Question:   Procedure    Answer:   Blood product(s)  . Complete patient signature process for consent form    Standing Status:   Standing    Number of Occurrences:   1  . CBC post transfusion - RN to place lab order with appropriate draw time    Standing Status:   Standing    Number of Occurrences:   1  . Apply neutral pressure cap    Standing Status:   Standing    Number of Occurrences:   20  . May draw labs from central line    Standing Status:   Standing    Number of Occurrences:   20  . For first catheter occlusion notify IV team ([place], [place]) or provider at all other sites.    Standing Status:   Standing    Number of Occurrences:   1  . Practitioner attestation of consent    I, the ordering practitioner, attest that I have discussed with the patient the benefits, risks, side effects, alternatives, likelihood of achieving goals and potential problems during recovery for the procedure listed.    Standing Status:   Standing    Number of Occurrences:   1    Order Specific Question:   Procedure    Answer:   Blood product(s)  . Complete patient signature process for consent form    Standing Status:   Standing    Number of Occurrences:   1  . Cardiac monitoring    Standing Status:   Standing    Number of Occurrences:   1  . Practitioner attestation of consent    I, the ordering practitioner, attest that I have discussed with the patient  the benefits, risks, side effects, alternatives, likelihood of achieving goals and potential problems during recovery for the procedure listed.    Standing Status:   Standing    Number of Occurrences:   1    Order Specific Question:   Procedure    Answer:   Blood product(s)  . Complete patient signature process for consent form    Standing Status:   Standing    Number of Occurrences:   1  . Practitioner attestation of consent    I, the ordering practitioner, attest that I have discussed with the patient the benefits, risks, side effects, alternatives, likelihood of achieving goals and potential problems during recovery for the procedure listed.    Standing Status:   Standing    Number of Occurrences:   1    Order Specific Question:   Procedure    Answer:   Blood product(s)  . Complete patient signature process for consent form    Standing Status:   Standing    Number of Occurrences:   1  . Full code    Standing Status:   Standing    Number of Occurrences:   1  . Consult to hospitalist    Standing Status:   Standing    Number of Occurrences:   1    Order Specific Question:   Place call to:    Answer:   [employer] photographer Question:   Reason for Consult    Answer:   Admit  . Consult to hematology Consult Timeframe: ROUTINE - requires response within 24 hours; Reason for Consult?  Neutropenic fever, pancytopenia, AML    Standing Status:   Standing    Number of Occurrences:   1    Order Specific Question:   Consult Timeframe    Answer:   ROUTINE - requires response within 24 hours    Order Specific Question:   Reason for Consult?    Answer:   Neutropenic fever, pancytopenia, AML  . Meropenem per pharmacyconsult    Standing Status:   Standing    Number of Occurrences:   1    Order Specific Question:   Antibiotic Indication:    Answer:   Febrile Neutropenia  . Contact precautions    Standing Status:   Standing    Number of Occurrences:   1  . Oxygen therapy  Mode or (Route): Nasal cannula; Liters Per Minute: 2; Keep 02 saturation: greater than 92 %    Standing Status:   Standing    Number of Occurrences:   1    Order Specific Question:   Mode or (Route)    Answer:   Nasal cannula    Order Specific Question:   Liters Per Minute    Answer:   2    Order Specific Question:   Keep 02 saturation    Answer:   greater than 92 %  . I-Stat CG4 Lactic Acid, ED    Standing Status:   Standing    Number of Occurrences:   2  . ED EKG 12-Lead    Standing Status:   Standing    Number of Occurrences:   1    Order Specific Question:   Reason for Exam    Answer:   Baseline  . EKG  . Type and screen Bryce Hamilton     Standing Status:   Standing    Number of Occurrences:   1  . Prepare RBC    Standing Status:   Standing    Number of Occurrences:   1    Order Specific Question:   # of Units    Answer:   2 units    Order Specific Question:   Transfusion Indications    Answer:   Other (Specify)    Order Specific Question:   If emergent release call blood bank    Answer:   Not emergent release  . Prepare Pheresed Platelets    Standing Status:   Standing    Number of Occurrences:   1    Order Specific Question:   Number of Apheresis Units    Answer:   1 unit (6-10 packs)    Order Specific Question:   Transfusion Indications    Answer:   PLT Count </=10,000/mm    Order Specific Question:   Special Requirements    Answer:   CMV/Leukoreduced    Order Specific Question:   Special Requirements    Answer:   Irradiated    Order Specific Question:   If emergent release call blood bank    Answer:   [facility] [phone]  . Type and screen Bryce Hamilton     Standing Status:   Standing    Number of Occurrences:   1  . Prepare RBC    leukoreduce    Standing Status:   Standing    Number of Occurrences:   1    Order Specific Question:   # of Units    Answer:   2 units  Order Specific Question:    Transfusion Indications    Answer:   Other Theme park manager)    Order Specific Question:   Special Requirements    Answer:   Irradiated    Order Specific Question:   Special Requirements    Answer:   CMV    Order Specific Question:   Special Requirements    Answer:   Other (Specify)    Order Specific Question:   If emergent release call blood bank    Answer:   [facility] [phone]  . Prepare Pheresed Platelets    Standing Status:   Standing    Number of Occurrences:   1    Order Specific Question:   Number of Apheresis Units    Answer:   1 unit (6-10 packs)    Order Specific Question:   Transfusion Indications    Answer:   PLT Count </=10,000/mm    Order Specific Question:   Special Requirements    Answer:   CMV/Leukoreduced    Order Specific Question:   Special Requirements    Answer:   Irradiated    Order Specific Question:   If emergent release call blood bank    Answer:   [facility] [phone]  . Routine line care    Standing Status:   Standing    Number of Occurrences:   33  . Admit to Inpatient (patient's expected length of stay will be greater than 2 midnights or inpatient only procedure)    Standing Status:   Standing    Number of Occurrences:   1    Order Specific Question:   Hospital Area    Answer:   [facility] [[id number]]    Order Specific Question:   Level of Care    Answer:   Telemetry [5]    Order Specific Question:   Diagnosis    Answer:   Febrile neutropenia [facility]) [[id number]]    Order Specific Question:   Admitting Physician    Answer:   Reubin Milan [[id number]]    Order Specific Question:   Attending Physician    Answer:   Reubin Milan [[id number]]    Order Specific Question:   Estimated length of stay    Answer:   past midnight tomorrow    Order Specific Question:   Certification:    Answer:   I certify this patient will need inpatient services for at least 2 midnights    Order Specific Question:   PT Class (Do Not Modify)    Answer:    Inpatient [101]    Order Specific Question:   PT Acc Code (Do Not Modify)    Answer:   Private [1]  . Transfer patient    Standing Status:   Standing    Number of Occurrences:   1    Order Specific Question:   Hospital Area    Answer:   [facility] [[id number]]    Order Specific Question:   Level of Care    Answer:   Stepdown [14]  . 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  Have notified them  of Carey.   2. Neutropenic fever.  Pt had temperature of 101.2 on 07/27/2018 at 0426.  Pt re-cultured today.  He is on  Meropenem/Diflucan/ACV.  BC + for Ecoli.   Sensitivities noted.  Will continue to monitor for fevers.  Pt was last treated with growth factor on 07/17/2018.  I have discussed with pharmacy that WBC remains decreased at 0.1.  Pt will be given Neupogen 480 mcg sq daily.  Will monitor counts daily.  Have recommended ID input to Hospitalist team due to fevers.    3.  Leukopenia.  WBC 0.1.  He is started on Neupogen 480 MCG daily.  Continue to monitor counts.    4.  Anemia and thrombocytopenia.  HB 7.5 Plts 11,000.  He will be transfused with 1 unit Pheresis plts and 2 units PRBCs Leukoreduced and irradiated.    5.  Rigors.  Improved with Demerol.  No further episodes.    6.  Diarrhea.  He reports no further episodes.    7.  Nutrition.  Encouraged po intake.

## 2018-07-27 NOTE — Progress Notes (Signed)
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  He was diagnosed with AML in March and currently undergoes treatment at [facility].  His last chemo treatment was 07/13/18.  He had a temperature of 103 at home and was instructed to come to his nearest emergency department.  Blood cultures with ESBL E. Coli.  oncology is on board.   Assessment & Plan:    1)E. coli ESBL bacteremia in the setting of febrile neutropenia- severe and persistent leukopenia WBC 0.1, continue IV meropenem, T max 102 at 8 pm and 101.2 at 430 am, initial admission blood cultures on 07/23/2018 had E. coli ESBL,  will repeat blood cultures on 07/27/2018.  UA on admission was negative for infection, repeat UA as well on 07/27/2018  2)Severe Pancytopenia due to chemotherapy -platelet count down to 11 K from 26 after transfusion, hemoglobin down to 7.5  (previously was transfused 2 units of packed cells this admission),   white count remains at only 0.1, denies bleeding at this time, Oncology is on board to determine feasibility of G-CSF.  Continue  Meropenem,Acyclovir and Diflucan, after discussion with Dr. Walden Field from oncology service,  Dr. Walden Field will facilitate administration of Neupogen, and transfusion of irradiated packed cells as well as transfusion of pharesed platelets on 07/27/18  3)AML-last chemo 07/13/2018, patient apparently got Neulasta on 07/17/2018, usually follows with oncologist at [facility]  Disposition/Need for in-Hospital Stay- patient unable to be  discharged at this time due to severe and persistent leukopenia WBC 0.1 and E. coli ESBL bacteremia requiring IV meropenem, continues with Persistent Neutropenic fevers in the setting of severe neutropenia/leukopenia, also requiring administration of Neupogen, transfusion of packed cells and platelets    DVT prophylaxis: SCDs Code Status: Full code Family Communication: Patient only  Consultants:   Oncology  ID phone consult  Procedures:   Transfusion  Antimicrobials:  Anti-infectives (From admission, onward)   Start     Dose/Rate Route Frequency Ordered Stop   07/27/18 1800  meropenem (MERREM) 1 g in sodium chloride 0.9 % 100 mL IVPB     1 g 200 mL/hr over 30 Minutes Intravenous Every 8 hours 07/27/18 1057     07/24/18 2200  acyclovir (ZOVIRAX) tablet 400 mg  Status:  Discontinued     400 mg Oral 2 times daily 07/24/18 1302 07/24/18 1306   07/24/18 2200  acyclovir (ZOVIRAX) tablet 400 mg     400 mg Oral 2 times daily 07/24/18 1304     07/24/18 1000  meropenem (MERREM) 1 g in sodium chloride 0.9 % 100 mL IVPB  Status:  Discontinued     1 g 200 mL/hr over 30 Minutes Intravenous Every 8 hours 07/24/18 0829 07/27/18 1057   07/23/18 1000  acyclovir (ZOVIRAX) tablet 400 mg  Status:  Discontinued     400 mg Oral 2 times daily 07/23/18 0334 07/24/18 1107   07/23/18 1000  fluconazole (DIFLUCAN) tablet 200 mg     200 mg Oral Daily 07/23/18 0334     07/23/18 1000  levofloxacin (LEVAQUIN)  tablet 500 mg  Status:  Discontinued     500 mg Oral Daily 07/23/18 0334 07/23/18 1005   07/23/18 1000  ceFEPIme (MAXIPIME) 2 g in sodium chloride 0.9 % 100 mL IVPB  Status:  Discontinued     2 g 200 mL/hr over 30 Minutes Intravenous Every 8 hours 07/23/18 0812 07/24/18 0812   07/23/18 1000  vancomycin (VANCOCIN) 1,250 mg in sodium chloride 0.9 % 250 mL IVPB  Status:  Discontinued     1,250 mg 166.7 mL/hr over 90 Minutes Intravenous Every 12 hours 07/23/18 0830 07/24/18 0812   07/23/18 0115   vancomycin (VANCOCIN) IVPB 1000 mg/200 mL premix     1,000 mg 200 mL/hr over 60 Minutes Intravenous  Once 07/23/18 0107 07/23/18 0328   07/23/18 0100  ceFEPIme (MAXIPIME) 2 g in sodium chloride 0.9 % 100 mL IVPB     2 g 200 mL/hr over 30 Minutes Intravenous  Once 07/23/18 0058 07/23/18 0155   07/23/18 0100  metroNIDAZOLE (FLAGYL) IVPB 500 mg  Status:  Discontinued     500 mg 100 mL/hr over 60 Minutes Intravenous Every 8 hours 07/23/18 0058 07/24/18 1107   07/23/18 0100  vancomycin (VANCOCIN) IVPB 1000 mg/200 mL premix     1,000 mg 200 mL/hr over 60 Minutes Intravenous  Once 07/23/18 0058 07/23/18 0227       Subjective:  T max 102 at 8 pm and 101.2 at 430 am, no further diarrhea, nausea but no vomiting, no dysuria, No cough or chest pains  Objective: Vitals:   07/27/18 0500 07/27/18 0524 07/27/18 0600 07/27/18 0700  BP: (!) 133/95  129/68 131/87  Pulse: 69  (!) 107 (!) 102  Resp: (!) 38  (!) 27 (!) 23  Temp:  99.4 F (37.4 C)    TempSrc:  Oral    SpO2: 96%  94% 95%  Weight: 92.9 kg     Height:        Intake/Output Summary (Last 24 hours) at 07/27/2018 1059 Last data filed at 07/27/2018 0426 Gross per 24 hour  Intake 2193.54 ml  Output 1975 ml  Net 218.54 ml   Filed Weights   07/25/18 0400 07/26/18 0500 07/27/18 0500  Weight: 96.4 kg 95.2 kg 92.9 kg    Examination:  Patient is examined daily including today on 07/27/18 , exams remain the same as of yesterday except that has changed    General exam: Alert, awake, oriented x 3 Neck- Right-sided Jugular Port-A-Cath site is clean dry and intact  Respiratory system: Clear to auscultation. Respiratory effort normal. Cardiovascular system:RRR.  No murmurs gastrointestinal system: Abdomen is nondistended, soft and nontender. No organomegaly or masses felt. Normal bowel sounds heard. Central nervous system: Alert and oriented. No focal neurological deficits. Extremities: No C/C/E, +pedal pulses Skin: No rashes, lesions or  ulcers Psychiatry: Judgement and insight appear normal. Mood & affect appropriate.   CBC: Recent Labs  Lab 07/23/18 0853 07/24/18 0422 07/25/18 0403 07/26/18 0419 07/27/18 0414  WBC 0.1* 0.1* 0.1* 0.1* 0.1*  NEUTROABS  --  TOO FEW TO COUNT, SMEAR AVAILABLE FOR REVIEW TOO FEW TO COUNT, SMEAR AVAILABLE FOR REVIEW TOO FEW TO COUNT, SMEAR AVAILABLE FOR REVIEW  --   HGB 6.9* 8.8* 8.7* 8.4* 7.5*  HCT 20.5* 26.0* 25.1* 24.8* 22.2*  MCV 94.5 92.9 91.3 91.5 90.6  PLT 20* 13* 22* 26* 11*   Basic Metabolic Panel: Recent Labs  Lab 07/23/18 0014 07/23/18 0114 07/23/18 0853 07/24/18 0422 07/25/18 0403 07/27/18 0414  NA  137  --  136 138  --  137  K 3.5  --  3.4* 3.6  --  2.8*  CL 105  --  106 108  --  104  CO2 24  --  27 24  --  26  GLUCOSE 124*  --  118* 109*  --  103*  BUN 18  --  14 12  --  9  CREATININE 1.02  --  0.90 0.90 0.77 0.73  CALCIUM 9.1  --  8.3* 8.4*  --  8.1*  MG  --  1.5*  --   --   --   --   PHOS  --  3.6  --   --   --   --    GFR: Estimated Creatinine Clearance: 120.7 mL/min (by C-G formula based on SCr of 0.73 mg/dL). Liver Function Tests: Recent Labs  Lab 07/23/18 0853 07/24/18 0422 07/25/18 0403 07/26/18 0419 07/27/18 0414  AST 17 20 18  13* 24  ALT 21 27 25 21 30   ALKPHOS 48 51 47 43 44  BILITOT 1.1 1.3* 0.8 0.7 1.1  PROT 6.4* 6.6 6.5 6.4* 6.1*  ALBUMIN 3.1* 3.2* 2.9* 2.7* 2.5*   No results for input(s): LIPASE, AMYLASE in the last 168 hours. No results for input(s): AMMONIA in the last 168 hours. Coagulation Profile: Recent Labs  Lab 07/23/18 0014  INR 1.28   Cardiac Enzymes: No results for input(s): CKTOTAL, CKMB, CKMBINDEX, TROPONINI in the last 168 hours. BNP (last 3 results) No results for input(s): PROBNP in the last 8760 hours. HbA1C: No results for input(s): HGBA1C in the last 72 hours. CBG: No results for input(s): GLUCAP in the last 168 hours. Lipid Profile: No results for input(s): CHOL, HDL, LDLCALC, TRIG, CHOLHDL, LDLDIRECT in  the last 72 hours. Thyroid Function Tests: No results for input(s): TSH, T4TOTAL, FREET4, T3FREE, THYROIDAB in the last 72 hours. Anemia Panel: No results for input(s): VITAMINB12, FOLATE, FERRITIN, TIBC, IRON, RETICCTPCT in the last 72 hours. Urine analysis:    Component Value Date/Time   COLORURINE STRAW (A) 07/23/2018 0100   APPEARANCEUR CLEAR 07/23/2018 0100   LABSPEC 1.013 07/23/2018 0100   PHURINE 5.0 07/23/2018 0100   GLUCOSEU NEGATIVE 07/23/2018 0100   HGBUR NEGATIVE 07/23/2018 0100   BILIRUBINUR NEGATIVE 07/23/2018 0100   KETONESUR NEGATIVE 07/23/2018 0100   PROTEINUR NEGATIVE 07/23/2018 0100   UROBILINOGEN 0.2 10/26/2009 1728   NITRITE NEGATIVE 07/23/2018 0100   LEUKOCYTESUR NEGATIVE 07/23/2018 0100   Sepsis Labs: @LABRCNTIP (procalcitonin:4,lacticidven:4)  ) Recent Results (from the past 240 hour(s))  Culture, blood (Routine x 2)     Status: Abnormal   Collection Time: 07/23/18 12:00 AM  Result Value Ref Range Status   Specimen Description   Final    PORTA CATH Performed at [facility], [address] [place], [place] [address]    Special Requests   Final    BOTTLES DRAWN AEROBIC AND ANAEROBIC Blood Culture adequate volume Performed at [facility], [address], [place], [place] [address]    Culture  Setup Time   Final    GRAM NEGATIVE RODS BOTH BOTTLES POSITIVE Gram Stain Report Called to,Read Back By and Verified With: BARBARA MORRIS 07/23/18 @ 1320 LLS CRITICAL RESULT CALLED TO, READ BACK BY AND VERIFIED WITHMiquel Dunn RN 07/23/18 2044 JDW Performed at [facility], [place] [address], [place], [place] [address]    Culture (A)  Final    ESCHERICHIA COLI SUSCEPTIBILITIES PERFORMED ON PREVIOUS CULTURE WITHIN THE  LAST 5 DAYS.    Report Status 07/26/2018 FINAL  Final  Blood Culture ID Panel (Reflexed)     Status: Abnormal   Collection Time: 07/23/18 12:00 AM  Result Value Ref Range Status   Enterococcus species NOT DETECTED NOT DETECTED Final   Listeria  monocytogenes NOT DETECTED NOT DETECTED Final   Staphylococcus species NOT DETECTED NOT DETECTED Final   Staphylococcus aureus NOT DETECTED NOT DETECTED Final   Streptococcus species NOT DETECTED NOT DETECTED Final   Streptococcus agalactiae NOT DETECTED NOT DETECTED Final   Streptococcus pneumoniae NOT DETECTED NOT DETECTED Final   Streptococcus pyogenes NOT DETECTED NOT DETECTED Final   Acinetobacter baumannii NOT DETECTED NOT DETECTED Final   Enterobacteriaceae species DETECTED (A) NOT DETECTED Final    Comment: Enterobacteriaceae represent a large family of gram-negative bacteria, not a single organism. CRITICAL RESULT CALLED TO, READ BACK BY AND VERIFIED WITH: Miquel Dunn RN 07/23/18 2044 JDW    Enterobacter cloacae complex NOT DETECTED NOT DETECTED Final   Escherichia coli DETECTED (A) NOT DETECTED Final    Comment: CRITICAL RESULT CALLED TO, READ BACK BY AND VERIFIED WITH: Miquel Dunn RN 07/23/18 2044 JDW    Klebsiella oxytoca NOT DETECTED NOT DETECTED Final   Klebsiella pneumoniae NOT DETECTED NOT DETECTED Final   Proteus species NOT DETECTED NOT DETECTED Final   Serratia marcescens NOT DETECTED NOT DETECTED Final   Carbapenem resistance NOT DETECTED NOT DETECTED Final   Haemophilus influenzae NOT DETECTED NOT DETECTED Final   Neisseria meningitidis NOT DETECTED NOT DETECTED Final   Pseudomonas aeruginosa NOT DETECTED NOT DETECTED Final   Candida albicans NOT DETECTED NOT DETECTED Final   Candida glabrata NOT DETECTED NOT DETECTED Final   Candida krusei NOT DETECTED NOT DETECTED Final   Candida parapsilosis NOT DETECTED NOT DETECTED Final   Candida tropicalis NOT DETECTED NOT DETECTED Final    Comment: Performed at [facility], [place] [address], [place], [place] [address]  Culture, blood (Routine x 2)     Status: Abnormal   Collection Time: 07/23/18 12:17 AM  Result Value Ref Range Status   Specimen Description   Final    BLOOD LEFT HAND Performed at [facility], [address], [place], [place] [address]    Special Requests   Final    BOTTLES DRAWN AEROBIC AND ANAEROBIC Blood Culture adequate volume Performed at [facility], [address] [place], [place] [address]    Culture  Setup Time   Final    GRAM NEGATIVE RODS BOTH BOTTES OF SET Gram Stain Report Called to,Read Back By and Verified With: B MORRIS 07/23/18 @ 1320 LLS CRITICAL RESULT CALLED TO, READ BACK BY AND VERIFIED WITHMiquel Dunn RN 07/23/18 2044 JDW Performed at [facility], [place] [address], [place], [place] [address]    Culture (A)  Final    ESCHERICHIA COLI Confirmed Extended Spectrum Beta-Lactamase Producer (ESBL).  In bloodstream infections from ESBL organisms, carbapenems are preferred over piperacillin/tazobactam. They are shown to have a lower risk of mortality.    Report Status 07/26/2018 FINAL  Final   Organism ID, Bacteria ESCHERICHIA COLI  Final      Susceptibility   Escherichia coli - MIC*    AMPICILLIN >=32 RESISTANT Resistant     CEFAZOLIN >=64 RESISTANT Resistant     CEFEPIME 32 RESISTANT Resistant     CEFTAZIDIME RESISTANT Resistant     CEFTRIAXONE >=64 RESISTANT Resistant     CIPROFLOXACIN >=4 RESISTANT Resistant  GENTAMICIN >=16 RESISTANT Resistant     IMIPENEM <=0.25 SENSITIVE Sensitive     TRIMETH/SULFA 40 SENSITIVE Sensitive     AMPICILLIN/SULBACTAM >=32 RESISTANT Resistant     PIP/TAZO 8 SENSITIVE Sensitive     Extended ESBL POSITIVE Resistant     * ESCHERICHIA COLI  MRSA PCR Screening     Status: None   Collection Time: 07/23/18  2:37 AM  Result Value Ref Range Status   MRSA by PCR NEGATIVE NEGATIVE Final    Comment:        The GeneXpert MRSA Assay (FDA approved for NASAL specimens only), is one component of a comprehensive MRSA colonization surveillance program. It is not intended to diagnose MRSA infection nor to guide or monitor treatment for MRSA infections. Performed at [facility], [address], [place], [place] [address]      Radiology  Studies: No results found.   Scheduled Meds: . sodium chloride   Intravenous Once  . acetaminophen  650 mg Oral Once  . acyclovir  400 mg Oral BID  . Chlorhexidine Gluconate Cloth  6 each Topical Daily  . fluconazole  200 mg Oral Daily  . pantoprazole  40 mg Oral Daily  . potassium chloride  40 mEq Oral Once  . potassium chloride  40 mEq Oral Once  . sodium chloride flush  10-40 mL Intracatheter Q12H   Continuous Infusions: . sodium chloride 125 mL/hr at 07/27/18 0313  . meropenem (MERREM) IV       LOS: 4 days    Roxan Hockey, MD [facility]  If 7PM-7AM, please contact night-coverage [email or web address] Password [email or web address] 07/27/2018, 10:59 AM

## 2018-07-27 NOTE — Progress Notes (Signed)
Pharmacy Antibiotic Note  Bryce Hamilton is a 52 y.o. male admitted on 07/22/2018 with febrile neutropenia/E. Coli in blood.  Pharmacy has been consulted for Merrem dosing. Blood Cx +E.Coli ESBL in blood. Currently an AML pt and last chemo 07/13/2018, pt is neutropenic and slow to recover--> on neupogen. Tmax 102.  Plan: Continue Meropenem 1000 mg IV every 8 hours Monitor labs, c/s, and patient improvement.  Height: 6\' 1"  (185.4 cm) Weight: 204 lb 12.9 oz (92.9 kg) IBW/kg (Calculated) : 79.9  Temp (24hrs), Avg:100.6 F (38.1 C), Min:99.4 F (37.4 C), Max:102 F (38.9 C)  Recent Labs  Lab 07/23/18 0014 07/23/18 0030 07/23/18 0853 07/24/18 0422 07/25/18 0403 07/26/18 0419 07/27/18 0414  WBC <0.1*  --  0.1* 0.1* 0.1* 0.1* 0.1*  CREATININE 1.02  --  0.90 0.90 0.77  --  0.73  LATICACIDVEN  --  1.08  --   --   --   --   --     Estimated Creatinine Clearance: 120.7 mL/min (by C-G formula based on SCr of 0.73 mg/dL).    No Known Allergies  Antimicrobials this admission: Meropenem 9/10 >> Vanco 9/9 >> 9/10 Cefepime 9/9 >>9/10 Flagyl 9/9 >>9/10 Fluconazole continued from PTA  Dose adjustments this admission: N/A  Microbiology results: 9/9 BCx: E. Coli: +ESBL s- merrem, septra 9/9 MRSA PCR: negative  Thank you for allowing pharmacy to be a part of this patient's care.  Isac Sarna, BS Pharm D, BCPS Clinical Pharmacist Pager [phone] 07/27/2018 11:22 AM

## 2018-07-27 NOTE — Progress Notes (Signed)
CRITICAL VALUE ALERT  Critical Value: WBC 0.1 Platelets 11  Date & Time Notied: 07-27-18 0459  Provider Notified: Manuella Ghazi  Orders Received/Actions taken: None at this time

## 2018-07-28 LAB — CBC WITH DIFFERENTIAL/PLATELET
BASOS ABS: 0 10*3/uL (ref 0.0–0.1)
Basophils Relative: 0 %
EOS ABS: 0 10*3/uL (ref 0.0–0.7)
Eosinophils Relative: 3 %
HEMATOCRIT: 25.1 % — AB (ref 39.0–52.0)
HEMOGLOBIN: 8.6 g/dL — AB (ref 13.0–17.0)
LYMPHS ABS: 0.1 10*3/uL — AB (ref 0.7–4.0)
LYMPHS PCT: 31 %
MCH: 31.6 pg (ref 26.0–34.0)
MCHC: 34.3 g/dL (ref 30.0–36.0)
MCV: 92.3 fL (ref 78.0–100.0)
MONOS PCT: 14 %
Monocytes Absolute: 0 10*3/uL — ABNORMAL LOW (ref 0.1–1.0)
Neutro Abs: 0.2 10*3/uL — ABNORMAL LOW (ref 1.7–7.7)
Neutrophils Relative %: 52 %
Platelets: 36 10*3/uL — ABNORMAL LOW (ref 150–400)
RBC: 2.72 MIL/uL — AB (ref 4.22–5.81)
RDW: 14.9 % (ref 11.5–15.5)
WBC: 0.3 10*3/uL — AB (ref 4.0–10.5)

## 2018-07-28 LAB — HEPATIC FUNCTION PANEL
ALBUMIN: 2.6 g/dL — AB (ref 3.5–5.0)
ALT: 53 U/L — ABNORMAL HIGH (ref 0–44)
AST: 44 U/L — AB (ref 15–41)
Alkaline Phosphatase: 47 U/L (ref 38–126)
Bilirubin, Direct: 0.2 mg/dL (ref 0.0–0.2)
Indirect Bilirubin: 0.8 mg/dL (ref 0.3–0.9)
TOTAL PROTEIN: 6.4 g/dL — AB (ref 6.5–8.1)
Total Bilirubin: 1 mg/dL (ref 0.3–1.2)

## 2018-07-28 NOTE — Progress Notes (Signed)
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  He was diagnosed with AML in March and currently undergoes treatment at [facility].  His last chemo treatment was 07/13/18.  He had a temperature of 103 at home and was instructed to come to his nearest emergency department.  Blood cultures with ESBL E. Coli.  oncology is on board.   Assessment & Plan:    1)E. coli ESBL bacteremia in the setting of febrile neutropenia- severe and persistent leukopenia WBC up to 0.3 from  0.1 after Neupogen on 07/27/2018,, continue IV meropenem,   initial admission blood cultures on 07/23/2018 had E. coli ESBL,    repeat blood cultures on 07/27/2018 pending,  UA on admission was negative for infection, repeat UA   on 07/27/2018 also neg  2)Severe Pancytopenia due to chemotherapy -platelet count up to 36K from 11 K posttransfusion,    hemoglobin up to 8.6 from 7.5  (posttransfusion, patient has not received total of 4 units of packed cells  This admission) , denies bleeding at this time,   Continue  Meropenem,Acyclovir and Diflucan, after discussion with Dr. Walden Field from oncology service,    3)AML-last chemo 07/13/2018, patient apparently got Neulasta on 07/17/2018, usually follows with oncologist at [facility] [place] started on 07/27/2018  Disposition/Need for in-Hospital Stay- patient unable to be discharged at this time due to severe and persistent leukopenia WBC 0.1 and E. coli ESBL bacteremia requiring IV meropenem, continues with Persistent Neutropenic  fevers in the setting of severe neutropenia/leukopenia, also requiring administration of Neupogen, transfusion of packed cells and platelets    DVT prophylaxis: SCDs Code Status: Full code Family Communication: Patient only  Consultants:   Oncology  ID phone consult  Procedures:   Transfusion  Antimicrobials:  Anti-infectives (From admission, onward)   Start     Dose/Rate Route Frequency Ordered Stop   07/27/18 1800  meropenem (MERREM) 1 g in sodium chloride 0.9 % 100 mL IVPB     1 g 200 mL/hr over 30 Minutes Intravenous Every 8 hours 07/27/18 1057     07/24/18 2200  acyclovir (ZOVIRAX) tablet 400 mg  Status:  Discontinued     400 mg Oral 2 times daily 07/24/18 1302 07/24/18 1306   07/24/18 2200  acyclovir (ZOVIRAX) tablet 400 mg     400 mg Oral 2 times daily 07/24/18 1304     07/24/18 1000  meropenem (MERREM) 1 g in sodium chloride 0.9 % 100 mL IVPB  Status:  Discontinued     1 g 200 mL/hr over 30 Minutes Intravenous Every 8 hours 07/24/18 0829 07/27/18 1057   07/23/18 1000  acyclovir (ZOVIRAX) tablet 400 mg  Status:  Discontinued     400 mg Oral 2 times daily 07/23/18 0334 07/24/18 1107   07/23/18 1000  fluconazole (DIFLUCAN) tablet 200 mg     200 mg Oral Daily 07/23/18 0334     07/23/18 1000  levofloxacin (LEVAQUIN) tablet 500 mg  Status:  Discontinued     500 mg Oral Daily 07/23/18 0334 07/23/18 1005  07/23/18 1000  ceFEPIme (MAXIPIME) 2 g in sodium chloride 0.9 % 100 mL IVPB  Status:  Discontinued     2 g 200 mL/hr over 30 Minutes Intravenous Every 8 hours 07/23/18 0812 07/24/18 0812   07/23/18 1000  vancomycin (VANCOCIN) 1,250 mg in sodium chloride 0.9 % 250 mL IVPB  Status:  Discontinued     1,250 mg 166.7 mL/hr over 90 Minutes Intravenous Every 12 hours 07/23/18 0830 07/24/18 0812   07/23/18 0115  vancomycin (VANCOCIN) IVPB 1000 mg/200 mL premix     1,000 mg 200 mL/hr over 60 Minutes Intravenous  Once 07/23/18 0107 07/23/18 0328   07/23/18 0100  ceFEPIme  (MAXIPIME) 2 g in sodium chloride 0.9 % 100 mL IVPB     2 g 200 mL/hr over 30 Minutes Intravenous  Once 07/23/18 0058 07/23/18 0155   07/23/18 0100  metroNIDAZOLE (FLAGYL) IVPB 500 mg  Status:  Discontinued     500 mg 100 mL/hr over 60 Minutes Intravenous Every 8 hours 07/23/18 0058 07/24/18 1107   07/23/18 0100  vancomycin (VANCOCIN) IVPB 1000 mg/200 mL premix     1,000 mg 200 mL/hr over 60 Minutes Intravenous  Once 07/23/18 0058 07/23/18 0227       Subjective: Feeling less achy,, no further high fevers, no chills, no vomiting    Objective: Vitals:   07/28/18 1123 07/28/18 1200 07/28/18 1300 07/28/18 1400  BP:  (!) 150/96 127/81 (!) 146/82  Pulse: 81 84 86 91  Resp:      Temp: 98.7 F (37.1 C)     TempSrc: Oral     SpO2: 99% 100% 97% 96%  Weight:      Height:        Intake/Output Summary (Last 24 hours) at 07/28/2018 1601 Last data filed at 07/28/2018 0450 Gross per 24 hour  Intake 2391.25 ml  Output 2100 ml  Net 291.25 ml   Filed Weights   07/26/18 0500 07/27/18 0500 07/28/18 0400  Weight: 95.2 kg 92.9 kg 94.6 kg    Examination:  Patient is examined daily including today on 07/28/18 , exams remain the same as of yesterday except that has changed    General exam: Alert, awake, oriented x 3 Neck- Right-sided Jugular Port-A-Cath site is clean dry and intact  Respiratory system: Clear to auscultation. Respiratory effort normal. Cardiovascular system:RRR.  No murmurs gastrointestinal system: Abdomen is nondistended, soft and nontender. No organomegaly or masses felt. Normal bowel sounds heard. Central nervous system: Alert and oriented. No focal neurological deficits. Extremities: No C/C/E, +pedal pulses Skin: No rashes, lesions or ulcers Psychiatry: Judgement and insight appear normal. Mood & affect appropriate.   CBC: Recent Labs  Lab 07/24/18 0422 07/25/18 0403 07/26/18 0419 07/27/18 0414 07/28/18 0913  WBC 0.1* 0.1* 0.1* 0.1* 0.3*  NEUTROABS TOO FEW TO  COUNT, SMEAR AVAILABLE FOR REVIEW TOO FEW TO COUNT, SMEAR AVAILABLE FOR REVIEW TOO FEW TO COUNT, SMEAR AVAILABLE FOR REVIEW  --  0.2*  HGB 8.8* 8.7* 8.4* 7.5* 8.6*  HCT 26.0* 25.1* 24.8* 22.2* 25.1*  MCV 92.9 91.3 91.5 90.6 92.3  PLT 13* 22* 26* 11* 36*   Basic Metabolic Panel: Recent Labs  Lab 07/23/18 0014 07/23/18 0114 07/23/18 0853 07/24/18 0422 07/25/18 0403 07/27/18 0414  NA 137  --  136 138  --  137  K 3.5  --  3.4* 3.6  --  2.8*  CL 105  --  106 108  --  104  CO2 24  --  27  24  --  26  GLUCOSE 124*  --  118* 109*  --  103*  BUN 18  --  14 12  --  9  CREATININE 1.02  --  0.90 0.90 0.77 0.73  CALCIUM 9.1  --  8.3* 8.4*  --  8.1*  MG  --  1.5*  --   --   --   --   PHOS  --  3.6  --   --   --   --    GFR: Estimated Creatinine Clearance: 120.7 mL/min (by C-G formula based on SCr of 0.73 mg/dL). Liver Function Tests: Recent Labs  Lab 07/24/18 0422 07/25/18 0403 07/26/18 0419 07/27/18 0414 07/28/18 0913  AST 20 18 13* 24 44*  ALT 27 25 21 30  53*  ALKPHOS 51 47 43 44 47  BILITOT 1.3* 0.8 0.7 1.1 1.0  PROT 6.6 6.5 6.4* 6.1* 6.4*  ALBUMIN 3.2* 2.9* 2.7* 2.5* 2.6*   No results for input(s): LIPASE, AMYLASE in the last 168 hours. No results for input(s): AMMONIA in the last 168 hours. Coagulation Profile: Recent Labs  Lab 07/23/18 0014  INR 1.28   Cardiac Enzymes: No results for input(s): CKTOTAL, CKMB, CKMBINDEX, TROPONINI in the last 168 hours. BNP (last 3 results) No results for input(s): PROBNP in the last 8760 hours. HbA1C: No results for input(s): HGBA1C in the last 72 hours. CBG: No results for input(s): GLUCAP in the last 168 hours. Lipid Profile: No results for input(s): CHOL, HDL, LDLCALC, TRIG, CHOLHDL, LDLDIRECT in the last 72 hours. Thyroid Function Tests: No results for input(s): TSH, T4TOTAL, FREET4, T3FREE, THYROIDAB in the last 72 hours. Anemia Panel: No results for input(s): VITAMINB12, FOLATE, FERRITIN, TIBC, IRON, RETICCTPCT in the last  72 hours. Urine analysis:    Component Value Date/Time   COLORURINE YELLOW 07/27/2018 1750   APPEARANCEUR CLEAR 07/27/2018 1750   LABSPEC 1.010 07/27/2018 1750   PHURINE 7.0 07/27/2018 1750   GLUCOSEU NEGATIVE 07/27/2018 1750   HGBUR SMALL (A) 07/27/2018 1750   BILIRUBINUR NEGATIVE 07/27/2018 1750   KETONESUR NEGATIVE 07/27/2018 1750   PROTEINUR NEGATIVE 07/27/2018 1750   UROBILINOGEN 0.2 10/26/2009 1728   NITRITE NEGATIVE 07/27/2018 1750   LEUKOCYTESUR NEGATIVE 07/27/2018 1750   Sepsis Labs: @LABRCNTIP (procalcitonin:4,lacticidven:4)  ) Recent Results (from the past 240 hour(s))  Culture, blood (Routine x 2)     Status: Abnormal   Collection Time: 07/23/18 12:00 AM  Result Value Ref Range Status   Specimen Description   Final    PORTA CATH Performed at [facility], [address] [place], [place] [address]    Special Requests   Final    BOTTLES DRAWN AEROBIC AND ANAEROBIC Blood Culture adequate volume Performed at [facility], [address], [place], [place] [address]    Culture  Setup Time   Final    GRAM NEGATIVE RODS BOTH BOTTLES POSITIVE Gram Stain Report Called to,Read Back By and Verified With: BARBARA MORRIS 07/23/18 @ 1320 LLS CRITICAL RESULT CALLED TO, READ BACK BY AND VERIFIED WITHMiquel Dunn RN 07/23/18 2044 JDW Performed at [facility], [place] [address] [place], [place] [address]    Culture (A)  Final    ESCHERICHIA COLI SUSCEPTIBILITIES PERFORMED ON PREVIOUS CULTURE WITHIN THE LAST 5 DAYS.    Report Status 07/26/2018 FINAL  Final  Blood Culture ID Panel (Reflexed)     Status: Abnormal   Collection Time: 07/23/18 12:00 AM  Result Value Ref Range Status   Enterococcus species  NOT DETECTED NOT DETECTED Final   Listeria monocytogenes NOT DETECTED NOT DETECTED Final   Staphylococcus species NOT DETECTED NOT DETECTED Final   Staphylococcus aureus NOT DETECTED NOT DETECTED Final   Streptococcus species NOT DETECTED NOT DETECTED Final   Streptococcus  agalactiae NOT DETECTED NOT DETECTED Final   Streptococcus pneumoniae NOT DETECTED NOT DETECTED Final   Streptococcus pyogenes NOT DETECTED NOT DETECTED Final   Acinetobacter baumannii NOT DETECTED NOT DETECTED Final   Enterobacteriaceae species DETECTED (A) NOT DETECTED Final    Comment: Enterobacteriaceae represent a large family of gram-negative bacteria, not a single organism. CRITICAL RESULT CALLED TO, READ BACK BY AND VERIFIED WITH: Miquel Dunn RN 07/23/18 2044 JDW    Enterobacter cloacae complex NOT DETECTED NOT DETECTED Final   Escherichia coli DETECTED (A) NOT DETECTED Final    Comment: CRITICAL RESULT CALLED TO, READ BACK BY AND VERIFIED WITH: Miquel Dunn RN 07/23/18 2044 JDW    Klebsiella oxytoca NOT DETECTED NOT DETECTED Final   Klebsiella pneumoniae NOT DETECTED NOT DETECTED Final   Proteus species NOT DETECTED NOT DETECTED Final   Serratia marcescens NOT DETECTED NOT DETECTED Final   Carbapenem resistance NOT DETECTED NOT DETECTED Final   Haemophilus influenzae NOT DETECTED NOT DETECTED Final   Neisseria meningitidis NOT DETECTED NOT DETECTED Final   Pseudomonas aeruginosa NOT DETECTED NOT DETECTED Final   Candida albicans NOT DETECTED NOT DETECTED Final   Candida glabrata NOT DETECTED NOT DETECTED Final   Candida krusei NOT DETECTED NOT DETECTED Final   Candida parapsilosis NOT DETECTED NOT DETECTED Final   Candida tropicalis NOT DETECTED NOT DETECTED Final    Comment: Performed at [facility], [place] [address], [place], [place] [address]  Culture, blood (Routine x 2)     Status: Abnormal   Collection Time: 07/23/18 12:17 AM  Result Value Ref Range Status   Specimen Description   Final    BLOOD LEFT HAND Performed at [facility], [address] [place], [place] [address]    Special Requests   Final    BOTTLES DRAWN AEROBIC AND ANAEROBIC Blood Culture adequate volume Performed at [facility], [address] [place], [place] [address]    Culture  Setup Time   Final     GRAM NEGATIVE RODS BOTH BOTTES OF SET Gram Stain Report Called to,Read Back By and Verified With: B MORRIS 07/23/18 @ 1320 LLS CRITICAL RESULT CALLED TO, READ BACK BY AND VERIFIED WITHMiquel Dunn RN 07/23/18 2044 JDW Performed at [facility], [place] [address], [place], [place] [address]    Culture (A)  Final    ESCHERICHIA COLI Confirmed Extended Spectrum Beta-Lactamase Producer (ESBL).  In bloodstream infections from ESBL organisms, carbapenems are preferred over piperacillin/tazobactam. They are shown to have a lower risk of mortality.    Report Status 07/26/2018 FINAL  Final   Organism ID, Bacteria ESCHERICHIA COLI  Final      Susceptibility   Escherichia coli - MIC*    AMPICILLIN >=32 RESISTANT Resistant     CEFAZOLIN >=64 RESISTANT Resistant     CEFEPIME 32 RESISTANT Resistant     CEFTAZIDIME RESISTANT Resistant     CEFTRIAXONE >=64 RESISTANT Resistant     CIPROFLOXACIN >=4 RESISTANT Resistant     GENTAMICIN >=16 RESISTANT Resistant     IMIPENEM <=0.25 SENSITIVE Sensitive     TRIMETH/SULFA 40 SENSITIVE Sensitive     AMPICILLIN/SULBACTAM >=32 RESISTANT Resistant     PIP/TAZO 8 SENSITIVE Sensitive  Extended ESBL POSITIVE Resistant     * ESCHERICHIA COLI  MRSA PCR Screening     Status: None   Collection Time: 07/23/18  2:37 AM  Result Value Ref Range Status   MRSA by PCR NEGATIVE NEGATIVE Final    Comment:        The GeneXpert MRSA Assay (FDA approved for NASAL specimens only), is one component of a comprehensive MRSA colonization surveillance program. It is not intended to diagnose MRSA infection nor to guide or monitor treatment for MRSA infections. Performed at [facility], [address] [place], [place] [address]   Culture, blood (Routine X 2) w Reflex to ID Panel     Status: None (Preliminary result)   Collection Time: 07/27/18 11:24 AM  Result Value Ref Range Status   Specimen Description   Final    RIGHT ANTECUBITAL BOTTLES DRAWN AEROBIC AND ANAEROBIC    Special Requests Blood Culture adequate volume  Final   Culture  Setup Time PENDING  Incomplete   Culture   Final    NO GROWTH 1 DAY Performed at [facility], [address] [place],  [address]    Report Status PENDING  Incomplete     Radiology Studies: No results found.   Scheduled Meds: . sodium chloride   Intravenous Once  . sodium chloride   Intravenous Once  . acetaminophen  650 mg Oral Once  . acyclovir  400 mg Oral BID  . Chlorhexidine Gluconate Cloth  6 each Topical Daily  . filgrastim (NEUPOGEN)  SQ  480 mcg Subcutaneous q1800  . fluconazole  200 mg Oral Daily  . pantoprazole  40 mg Oral Daily  . sodium chloride flush  10-40 mL Intracatheter Q12H   Continuous Infusions: . sodium chloride 75 mL/hr at 07/28/18 0159  . meropenem (MERREM) IV 1 g (07/28/18 1020)     LOS: 5 days    Roxan Hockey, MD [facility]  If 7PM-7AM, please contact night-coverage [email or web address] Password [facility] 07/28/2018, 4:01 PM

## 2018-07-29 LAB — BASIC METABOLIC PANEL
Anion gap: 4 — ABNORMAL LOW (ref 5–15)
BUN: 8 mg/dL (ref 6–20)
CALCIUM: 8.6 mg/dL — AB (ref 8.9–10.3)
CHLORIDE: 103 mmol/L (ref 98–111)
CO2: 33 mmol/L — AB (ref 22–32)
Creatinine, Ser: 0.77 mg/dL (ref 0.61–1.24)
GFR calc Af Amer: >60 mL/min (ref >60)
GLUCOSE: 101 mg/dL — AB (ref 70–99)
POTASSIUM: 3.3 mmol/L — AB (ref 3.5–5.1)
SODIUM: 140 mmol/L (ref 135–145)

## 2018-07-29 LAB — BPAM RBC
BLOOD PRODUCT EXPIRATION DATE: 201910072359
BLOOD PRODUCT EXPIRATION DATE: 201910102359
ISSUE DATE / TIME: 201909132327
ISSUE DATE / TIME: 201909140232
UNIT TYPE AND RH: 5100
UNIT TYPE AND RH: 5100

## 2018-07-29 LAB — CBC WITH DIFFERENTIAL/PLATELET
BASOS ABS: 0 10*3/uL (ref 0.0–0.1)
Basophils Relative: 0 %
Eosinophils Absolute: 0 10*3/uL (ref 0.0–0.7)
Eosinophils Relative: 3 %
HEMATOCRIT: 26.3 % — AB (ref 39.0–52.0)
HEMOGLOBIN: 8.9 g/dL — AB (ref 13.0–17.0)
LYMPHS ABS: 0.2 10*3/uL — AB (ref 0.7–4.0)
LYMPHS PCT: 28 %
MCH: 31.3 pg (ref 26.0–34.0)
MCHC: 33.8 g/dL (ref 30.0–36.0)
MCV: 92.6 fL (ref 78.0–100.0)
MONOS PCT: 10 %
Monocytes Absolute: 0.1 10*3/uL (ref 0.1–1.0)
NEUTROS PCT: 59 %
Neutro Abs: 0.4 10*3/uL — ABNORMAL LOW (ref 1.7–7.7)
Platelets: 35 10*3/uL — ABNORMAL LOW (ref 150–400)
RBC: 2.84 MIL/uL — AB (ref 4.22–5.81)
RDW: 14.4 % (ref 11.5–15.5)
WBC: 0.7 10*3/uL — AB (ref 4.0–10.5)

## 2018-07-29 LAB — TYPE AND SCREEN
ABO/RH(D): O POS
ANTIBODY SCREEN: NEGATIVE
UNIT DIVISION: 0
UNIT DIVISION: 0

## 2018-07-29 LAB — HEPATIC FUNCTION PANEL
ALBUMIN: 2.6 g/dL — AB (ref 3.5–5.0)
ALT: 78 U/L — AB (ref 0–44)
AST: 55 U/L — ABNORMAL HIGH (ref 15–41)
Alkaline Phosphatase: 50 U/L (ref 38–126)
BILIRUBIN TOTAL: 1.2 mg/dL (ref 0.3–1.2)
Bilirubin, Direct: 0.1 mg/dL (ref 0.0–0.2)
Indirect Bilirubin: 1.1 mg/dL — ABNORMAL HIGH (ref 0.3–0.9)
Total Protein: 6.6 g/dL (ref 6.5–8.1)

## 2018-07-29 MED ORDER — POTASSIUM CHLORIDE CRYS ER 20 MEQ PO TBCR
40.0000 meq | EXTENDED_RELEASE_TABLET | Freq: Once | ORAL | Status: AC
  Administered 2018-07-29: 40 meq via ORAL
  Filled 2018-07-29: qty 2

## 2018-07-29 NOTE — Progress Notes (Signed)
Patient was taken of telemetry and pulse ox leads per verbal from Dr. Denton Brick at around 0930 due to doctor wanting patient to ambulate around room throughout the day. Dr. Denton Brick stated he would be changing patient level of care to med-surg.

## 2018-07-29 NOTE — Progress Notes (Signed)
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  He was diagnosed with AML in March and currently undergoes treatment at [facility].  His last chemo treatment was 07/13/18.  He had a temperature of 103 at home and was instructed to come to his nearest emergency department.  Blood cultures with ESBL E. Coli.  oncology is on board.   Assessment & Plan:    1)E. coli ESBL bacteremia in the setting of febrile neutropenia-  WBC up to 0.7 from  0.1 after Neupogen on 07/27/2018 and 07/28/18,, continue IV meropenem,   initial admission blood cultures on 07/23/2018 had E. coli ESBL,    repeat blood cultures on 07/27/2018 negative so far ,  UA on admission was negative for infection, repeat UA   on 07/27/2018 also neg  2)Severe Pancytopenia due to chemotherapy -platelet count up to 35K from 11 K posttransfusion,    hemoglobin up to 8.9 from 7.5  (posttransfusion, patient has not received total of 4 units of packed cells  This admission) , denies bleeding at this time,   Continue  Meropenem,Acyclovir and Diflucan, after discussion with Dr. Walden [place] from oncology service,    3)AML-last chemo 07/13/2018, patient apparently got Neulasta on 07/17/2018, usually follows with oncologist at [facility] [place] started on 07/27/2018  Disposition/Need for in-Hospital Stay- patient unable to be discharged at this time due to severe and persistent leukopenia WBC 0.7 and E. coli ESBL bacteremia requiring IV meropenem, continues with Persistent Neutropenic fevers in  the setting of severe neutropenia/leukopenia, also requiring administration of Neupogen, transfusion of packed cells and platelets   DVT prophylaxis: SCDs Code Status: Full code Family Communication: Patient only  Consultants:   Oncology  ID phone consult  Procedures:   Transfusion  Antimicrobials:  Anti-infectives (From admission, onward)   Start     Dose/Rate Route Frequency Ordered Stop   07/27/18 1800  meropenem (MERREM) 1 g in sodium chloride 0.9 % 100 mL IVPB     1 g 200 mL/hr over 30 Minutes Intravenous Every 8 hours 07/27/18 1057     07/24/18 2200  acyclovir (ZOVIRAX) tablet 400 mg  Status:  Discontinued     400 mg Oral 2 times daily 07/24/18 1302 07/24/18 1306   07/24/18 2200  acyclovir (ZOVIRAX) tablet 400 mg     400 mg Oral 2 times daily 07/24/18 1304     07/24/18 1000  meropenem (MERREM) 1 g in sodium chloride 0.9 % 100 mL IVPB  Status:  Discontinued     1 g 200 mL/hr over 30 Minutes Intravenous Every 8 hours 07/24/18 0829 07/27/18 1057   07/23/18 1000  acyclovir (ZOVIRAX) tablet 400 mg  Status:  Discontinued     400 mg Oral 2 times daily 07/23/18 0334 07/24/18 1107   07/23/18 1000  fluconazole (DIFLUCAN) tablet 200 mg     200 mg Oral Daily 07/23/18 0334     07/23/18 1000  levofloxacin (LEVAQUIN) tablet 500 mg  Status:  Discontinued     500 mg Oral Daily 07/23/18 0334 07/23/18  1005   07/23/18 1000  ceFEPIme (MAXIPIME) 2 g in sodium chloride 0.9 % 100 mL IVPB  Status:  Discontinued     2 g 200 mL/hr over 30 Minutes Intravenous Every 8 hours 07/23/18 0812 07/24/18 0812   07/23/18 1000  vancomycin (VANCOCIN) 1,250 mg in sodium chloride 0.9 % 250 mL IVPB  Status:  Discontinued     1,250 mg 166.7 mL/hr over 90 Minutes Intravenous Every 12 hours 07/23/18 0830 07/24/18 0812   07/23/18 0115  vancomycin (VANCOCIN) IVPB 1000 mg/200 mL premix     1,000 mg 200 mL/hr over 60 Minutes Intravenous  Once 07/23/18 0107 07/23/18 0328   07/23/18 0100  ceFEPIme (MAXIPIME) 2 g in  sodium chloride 0.9 % 100 mL IVPB     2 g 200 mL/hr over 30 Minutes Intravenous  Once 07/23/18 0058 07/23/18 0155   07/23/18 0100  metroNIDAZOLE (FLAGYL) IVPB 500 mg  Status:  Discontinued     500 mg 100 mL/hr over 60 Minutes Intravenous Every 8 hours 07/23/18 0058 07/24/18 1107   07/23/18 0100  vancomycin (VANCOCIN) IVPB 1000 mg/200 mL premix     1,000 mg 200 mL/hr over 60 Minutes Intravenous  Once 07/23/18 0058 07/23/18 0227       Subjective: Feeling less achy,, no further high fevers, no chills, no vomiting    Objective: Vitals:   07/29/18 0925 07/29/18 1058 07/29/18 1622 07/29/18 1633  BP:  140/88  (!) 150/99  Pulse:    75  Resp:    18  Temp:  98.5 F (36.9 C) 98.6 F (37 C)   TempSrc:  Oral Oral   SpO2: 99%   99%  Weight:      Height:        Intake/Output Summary (Last 24 hours) at 07/29/2018 1903 Last data filed at 07/29/2018 1500 Gross per 24 hour  Intake 491.75 ml  Output 2100 ml  Net -1608.25 ml   Filed Weights   07/27/18 0500 07/28/18 0400 07/29/18 0400  Weight: 92.9 kg 94.6 kg 93.8 kg    Examination:  Patient is examined daily including today on 07/29/18 , exams remain the same as of yesterday except that has changed    General exam: Alert, awake, oriented x 3 Neck- Right-sided Jugular Port-A-Cath site is clean dry and intact  Respiratory system: Clear to auscultation. Respiratory effort normal. Cardiovascular system:RRR.  No murmurs gastrointestinal system: Abdomen is nondistended, soft and nontender. No organomegaly or masses felt. Normal bowel sounds heard. Central nervous system: Alert and oriented. No focal neurological deficits. Extremities: No C/C/E, +pedal pulses Skin: No rashes, lesions or ulcers Psychiatry: Judgement and insight appear normal. Mood & affect appropriate.   CBC: Recent Labs  Lab 07/24/18 0422 07/25/18 0403 07/26/18 0419 07/27/18 0414 07/28/18 0913 07/29/18 0413  WBC 0.1* 0.1* 0.1* 0.1* 0.3* 0.7*  NEUTROABS TOO FEW TO  COUNT, SMEAR AVAILABLE FOR REVIEW TOO FEW TO COUNT, SMEAR AVAILABLE FOR REVIEW TOO FEW TO COUNT, SMEAR AVAILABLE FOR REVIEW  --  0.2* 0.4*  HGB 8.8* 8.7* 8.4* 7.5* 8.6* 8.9*  HCT 26.0* 25.1* 24.8* 22.2* 25.1* 26.3*  MCV 92.9 91.3 91.5 90.6 92.3 92.6  PLT 13* 22* 26* 11* 36* 35*   Basic Metabolic Panel: Recent Labs  Lab 07/23/18 0014 07/23/18 0114 07/23/18 0853 07/24/18 0422 07/25/18 0403 07/27/18 0414 07/29/18 0413  NA 137  --  136 138  --  137 140  K 3.5  --  3.4* 3.6  --  2.8* 3.3*  CL  105  --  106 108  --  104 103  CO2 24  --  27 24  --  26 33*  GLUCOSE 124*  --  118* 109*  --  103* 101*  BUN 18  --  14 12  --  9 8  CREATININE 1.02  --  0.90 0.90 0.77 0.73 0.77  CALCIUM 9.1  --  8.3* 8.4*  --  8.1* 8.6*  MG  --  1.5*  --   --   --   --   --   PHOS  --  3.6  --   --   --   --   --    GFR: Estimated Creatinine Clearance: 120.7 mL/min (by C-G formula based on SCr of 0.77 mg/dL). Liver Function Tests: Recent Labs  Lab 07/25/18 0403 07/26/18 0419 07/27/18 0414 07/28/18 0913 07/29/18 0413  AST 18 13* 24 44* 55*  ALT 25 21 30  53* 78*  ALKPHOS 47 43 44 47 50  BILITOT 0.8 0.7 1.1 1.0 1.2  PROT 6.5 6.4* 6.1* 6.4* 6.6  ALBUMIN 2.9* 2.7* 2.5* 2.6* 2.6*   No results for input(s): LIPASE, AMYLASE in the last 168 hours. No results for input(s): AMMONIA in the last 168 hours. Coagulation Profile: Recent Labs  Lab 07/23/18 0014  INR 1.28   Cardiac Enzymes: No results for input(s): CKTOTAL, CKMB, CKMBINDEX, TROPONINI in the last 168 hours. BNP (last 3 results) No results for input(s): PROBNP in the last 8760 hours. HbA1C: No results for input(s): HGBA1C in the last 72 hours. CBG: No results for input(s): GLUCAP in the last 168 hours. Lipid Profile: No results for input(s): CHOL, HDL, LDLCALC, TRIG, CHOLHDL, LDLDIRECT in the last 72 hours. Thyroid Function Tests: No results for input(s): TSH, T4TOTAL, FREET4, T3FREE, THYROIDAB in the last 72 hours. Anemia Panel: No  results for input(s): VITAMINB12, FOLATE, FERRITIN, TIBC, IRON, RETICCTPCT in the last 72 hours. Urine analysis:    Component Value Date/Time   COLORURINE YELLOW 07/27/2018 1750   APPEARANCEUR CLEAR 07/27/2018 1750   LABSPEC 1.010 07/27/2018 1750   PHURINE 7.0 07/27/2018 1750   GLUCOSEU NEGATIVE 07/27/2018 1750   HGBUR SMALL (A) 07/27/2018 1750   BILIRUBINUR NEGATIVE 07/27/2018 1750   KETONESUR NEGATIVE 07/27/2018 1750   PROTEINUR NEGATIVE 07/27/2018 1750   UROBILINOGEN 0.2 10/26/2009 1728   NITRITE NEGATIVE 07/27/2018 1750   LEUKOCYTESUR NEGATIVE 07/27/2018 1750   Sepsis Labs: @LABRCNTIP (procalcitonin:4,lacticidven:4)  ) Recent Results (from the past 240 hour(s))  Culture, blood (Routine x 2)     Status: Abnormal   Collection Time: 07/23/18 12:00 AM  Result Value Ref Range Status   Specimen Description   Final    PORTA CATH Performed at [facility], [address] [place], [place] [address]    Special Requests   Final    BOTTLES DRAWN AEROBIC AND ANAEROBIC Blood Culture adequate volume Performed at [facility], [address], [place], [place] [address]    Culture  Setup Time   Final    GRAM NEGATIVE RODS BOTH BOTTLES POSITIVE Gram Stain Report Called to,Read Back By and Verified With: BARBARA MORRIS 07/23/18 @ 1320 LLS CRITICAL RESULT CALLED TO, READ BACK BY AND VERIFIED WITHMiquel Dunn RN 07/23/18 2044 JDW Performed at [facility], [place] [address] [place], [place] [address]    Culture (A)  Final    ESCHERICHIA COLI SUSCEPTIBILITIES PERFORMED ON PREVIOUS CULTURE WITHIN THE LAST 5 DAYS.    Report Status 07/26/2018 FINAL  Final  Blood Culture ID Panel (Reflexed)     Status: Abnormal   Collection Time: 07/23/18 12:00 AM  Result Value Ref Range Status   Enterococcus species NOT DETECTED NOT DETECTED Final   Listeria monocytogenes NOT DETECTED NOT DETECTED Final   Staphylococcus species NOT DETECTED NOT DETECTED Final   Staphylococcus aureus NOT DETECTED NOT DETECTED  Final   Streptococcus species NOT DETECTED NOT DETECTED Final   Streptococcus agalactiae NOT DETECTED NOT DETECTED Final   Streptococcus pneumoniae NOT DETECTED NOT DETECTED Final   Streptococcus pyogenes NOT DETECTED NOT DETECTED Final   Acinetobacter baumannii NOT DETECTED NOT DETECTED Final   Enterobacteriaceae species DETECTED (A) NOT DETECTED Final    Comment: Enterobacteriaceae represent a large family of gram-negative bacteria, not a single organism. CRITICAL RESULT CALLED TO, READ BACK BY AND VERIFIED WITH: Miquel Dunn RN 07/23/18 2044 JDW    Enterobacter cloacae complex NOT DETECTED NOT DETECTED Final   Escherichia coli DETECTED (A) NOT DETECTED Final    Comment: CRITICAL RESULT CALLED TO, READ BACK BY AND VERIFIED WITH: Miquel Dunn RN 07/23/18 2044 JDW    Klebsiella oxytoca NOT DETECTED NOT DETECTED Final   Klebsiella pneumoniae NOT DETECTED NOT DETECTED Final   Proteus species NOT DETECTED NOT DETECTED Final   Serratia marcescens NOT DETECTED NOT DETECTED Final   Carbapenem resistance NOT DETECTED NOT DETECTED Final   Haemophilus influenzae NOT DETECTED NOT DETECTED Final   Neisseria meningitidis NOT DETECTED NOT DETECTED Final   Pseudomonas aeruginosa NOT DETECTED NOT DETECTED Final   Candida albicans NOT DETECTED NOT DETECTED Final   Candida glabrata NOT DETECTED NOT DETECTED Final   Candida krusei NOT DETECTED NOT DETECTED Final   Candida parapsilosis NOT DETECTED NOT DETECTED Final   Candida tropicalis NOT DETECTED NOT DETECTED Final    Comment: Performed at [facility], [place] [address] [place], [place] [address]  Culture, blood (Routine x 2)     Status: Abnormal   Collection Time: 07/23/18 12:17 AM  Result Value Ref Range Status   Specimen Description   Final    BLOOD LEFT HAND Performed at [facility], [address] [place], [place] [address]    Special Requests   Final    BOTTLES DRAWN AEROBIC AND ANAEROBIC Blood Culture adequate volume Performed at [facility], [address], [place], [place] [address]    Culture  Setup Time   Final    GRAM NEGATIVE RODS BOTH BOTTES OF SET Gram Stain Report Called to,Read Back By and Verified With: B MORRIS 07/23/18 @ 1320 LLS CRITICAL RESULT CALLED TO, READ BACK BY AND VERIFIED WITHMiquel Dunn RN 07/23/18 2044 JDW Performed at [facility], [place] [address] [place], [place] [address]    Culture (A)  Final    ESCHERICHIA COLI Confirmed Extended Spectrum Beta-Lactamase Producer (ESBL).  In bloodstream infections from ESBL organisms, carbapenems are preferred over piperacillin/tazobactam. They are shown to have a lower risk of mortality.    Report Status 07/26/2018 FINAL  Final   Organism ID, Bacteria ESCHERICHIA COLI  Final      Susceptibility   Escherichia coli - MIC*    AMPICILLIN >=32 RESISTANT Resistant     CEFAZOLIN >=64 RESISTANT Resistant     CEFEPIME 32 RESISTANT Resistant     CEFTAZIDIME RESISTANT Resistant     CEFTRIAXONE >=64 RESISTANT Resistant     CIPROFLOXACIN >=4 RESISTANT Resistant     GENTAMICIN >=16 RESISTANT Resistant     IMIPENEM <=0.25 SENSITIVE Sensitive  TRIMETH/SULFA 40 SENSITIVE Sensitive     AMPICILLIN/SULBACTAM >=32 RESISTANT Resistant     PIP/TAZO 8 SENSITIVE Sensitive     Extended ESBL POSITIVE Resistant     * ESCHERICHIA COLI  MRSA PCR Screening     Status: None   Collection Time: 07/23/18  2:37 AM  Result Value Ref Range Status   MRSA by PCR NEGATIVE NEGATIVE Final    Comment:        The GeneXpert MRSA Assay (FDA approved for NASAL specimens only), is one component of a comprehensive MRSA colonization surveillance program. It is not intended to diagnose MRSA infection nor to guide or monitor treatment for MRSA infections. Performed at [facility], [address], [place], [place] [address]   Culture, blood (Routine X 2) w Reflex to ID Panel     Status: None (Preliminary result)   Collection Time: 07/27/18 11:24 AM  Result Value Ref Range Status   Specimen  Description   Final    RIGHT ANTECUBITAL BOTTLES DRAWN AEROBIC AND ANAEROBIC   Special Requests Blood Culture adequate volume  Final   Culture  Setup Time PENDING  Incomplete   Culture   Final    NO GROWTH 2 DAYS Performed at [facility], [address], [place], [place] [address]    Report Status PENDING  Incomplete     Radiology Studies: No results found.   Scheduled Meds: . sodium chloride   Intravenous Once  . sodium chloride   Intravenous Once  . acetaminophen  650 mg Oral Once  . acyclovir  400 mg Oral BID  . Chlorhexidine Gluconate Cloth  6 each Topical Daily  . filgrastim (NEUPOGEN)  SQ  480 mcg Subcutaneous q1800  . fluconazole  200 mg Oral Daily  . pantoprazole  40 mg Oral Daily  . sodium chloride flush  10-40 mL Intracatheter Q12H   Continuous Infusions: . sodium chloride 10 mL/hr at 07/29/18 1546  . meropenem (MERREM) IV 1 g (07/29/18 1835)     LOS: 6 days    Roxan Hockey, MD [facility]  If 7PM-7AM, please contact night-coverage [email or web address] Password [email or web address] 07/29/2018, 7:03 PM

## 2018-07-30 ENCOUNTER — Other Ambulatory Visit (HOSPITAL_COMMUNITY): Payer: BC Managed Care – PPO

## 2018-07-30 LAB — HEPATIC FUNCTION PANEL
ALBUMIN: 2.7 g/dL — AB (ref 3.5–5.0)
ALK PHOS: 51 U/L (ref 38–126)
ALT: 82 U/L — ABNORMAL HIGH (ref 0–44)
AST: 45 U/L — ABNORMAL HIGH (ref 15–41)
BILIRUBIN DIRECT: 0.1 mg/dL (ref 0.0–0.2)
BILIRUBIN INDIRECT: 0.7 mg/dL (ref 0.3–0.9)
BILIRUBIN TOTAL: 0.8 mg/dL (ref 0.3–1.2)
Total Protein: 6.7 g/dL (ref 6.5–8.1)

## 2018-07-30 LAB — CBC
HCT: 29.1 % — ABNORMAL LOW (ref 39.0–52.0)
Hemoglobin: 9.8 g/dL — ABNORMAL LOW (ref 13.0–17.0)
MCH: 31.6 pg (ref 26.0–34.0)
MCHC: 33.7 g/dL (ref 30.0–36.0)
MCV: 93.9 fL (ref 78.0–100.0)
PLATELETS: 27 10*3/uL — AB (ref 150–400)
RBC: 3.1 MIL/uL — ABNORMAL LOW (ref 4.22–5.81)
RDW: 14.1 % (ref 11.5–15.5)
WBC: 1.2 10*3/uL — CL (ref 4.0–10.5)

## 2018-07-30 LAB — PREPARE PLATELET PHERESIS: UNIT DIVISION: 0

## 2018-07-30 LAB — CREATININE, SERUM
CREATININE: 0.75 mg/dL (ref 0.61–1.24)
GFR calc Af Amer: >60 mL/min (ref >60)

## 2018-07-30 LAB — BPAM PLATELET PHERESIS
BLOOD PRODUCT EXPIRATION DATE: 201909152359
ISSUE DATE / TIME: 201909131737
Unit Type and Rh: 5100

## 2018-07-30 MED ORDER — POTASSIUM CHLORIDE CRYS ER 20 MEQ PO TBCR
40.0000 meq | EXTENDED_RELEASE_TABLET | Freq: Once | ORAL | Status: AC
  Administered 2018-07-30: 40 meq via ORAL
  Filled 2018-07-30: qty 2

## 2018-07-30 MED ORDER — HEPARIN SOD (PORK) LOCK FLUSH 100 UNIT/ML IV SOLN
500.0000 [IU] | INTRAVENOUS | Status: AC | PRN
  Administered 2018-07-30: 500 [IU]
  Filled 2018-07-30: qty 5

## 2018-07-30 MED ORDER — SULFAMETHOXAZOLE-TRIMETHOPRIM 800-160 MG PO TABS: 1.0000 | ORAL_TABLET | Freq: Two times a day (BID) | ORAL | 0 refills | Status: AC

## 2018-07-30 MED ORDER — FILGRASTIM 480 MCG/1.6ML IJ SOLN
480.0000 ug | Freq: Every day | INTRAMUSCULAR | Status: DC
  Administered 2018-07-30: 480 ug via SUBCUTANEOUS
  Filled 2018-07-30 (×3): qty 1.6

## 2018-07-30 NOTE — Progress Notes (Signed)
Patient alert and oriented x4. No complaints of pain, shortness of breath, chest pain, dizziness, nausea or vomiting. Patient up out of bed, ambulatory independently with steady gait. Patient tolerated medications and diet well. Appetite good. IV discontinued and implanted port deaccessed without complications. Discharge instructions, follow up with providers and medication education completed. Patient expressed full understanding of instructions, follow up and medication education. Patient discharged with all belongings for home via car (Daughter driving patient home).

## 2018-07-30 NOTE — Discharge Summary (Signed)
Bryce Hamilton, is a 52 y.o. male  DOB Mar 09, 1966  MRN [id number].  Admission date:  07/22/2018  Admitting Physician  Reubin Milan, MD  Discharge Date:  07/30/2018   Primary MD  Mikey Kirschner, MD  Recommendations for primary care physician for things to follow:   1)Your platelet count is still low so Avoid ibuprofen/Advil/Aleve/Motrin/Goody Powders/Naproxen/BC powders/Meloxicam/Diclofenac/Indomethacin and other Nonsteroidal anti-inflammatory medications as these will make you more likely to bleed and can cause stomach ulcers, can also cause Kidney problems.   2)Take Bactrim/sulfa antibiotic twice a day for the next week for infection in your blood  3)Return to the oncology/outpatient cancer unit on 07/31/2018 for blood work, you also need to continue your usual blood work and you  on Mondays and Thursdays,  4) Dr Walden Field the Oncologist will tell you  if you needs additional transfusion after your blood work is done on Tuesday, 07/31/2018  5)Also follow-up with your oncologist at [facility] as previously scheduled and advised  6)Repeat CBC and CMP blood work on Tuesday, 07/31/2018 at the cancer/oncology unit here in [place] with Dr. Walden Field   Admission Diagnosis  Neutropenic fever ([place]) [D70.9, R50.81] Pancytopenia ([place]) [D61.818]   Discharge Diagnosis  Neutropenic fever ([place]) [D70.9, R50.81] Pancytopenia ([place]) [F35.456]    Principal Problem:   E coli ESBL bacteremia in Neutropenic Patient Active Problems:   Hyperlipidemia   AML (acute myeloid leukemia) with failed remission ([place])   Febrile neutropenia (HCC)   Pancytopenia due to chemotherapy ([place])   Hyperbilirubinemia   Hypertension   Hypomagnesemia   Sinus tachycardia      Past Medical History:  Diagnosis Date  . Hyperlipidemia   . Hypertension   . Leukemia [facility])     Past Surgical History:  Procedure  Laterality Date  . APPENDECTOMY     has had scar tissue removed in that area since appendectomy  . CHOLECYSTECTOMY    . COLONOSCOPY N/A 02/10/2016   Procedure: COLONOSCOPY;  Surgeon: Danie Binder, MD;  Location: [facility] ENDO SUITE;  Service: Endoscopy;  Laterality: N/A;  1:45 PM       HPI  from the history and physical done on the day of admission:   Chief Complaint: Fever.  HPI: Bryce Hamilton is a 52 y.o. male with medical history significant of hyperlipidemia, hypertension, AML with recent chemotherapy treatment with cytarabine cycle # 4 who is coming to the emergency department due to febrile neutropenia.  Per patient, he started having fever in the evening around 2200. He just received his last high dose cytarabine treatment this past week and was discharged three days ago. He mentions that he has had febrile neutropenia in 3 out of four cycles of chemotherapy. He has been taking prophylactic acyclovir, fluconazole and levofloxacin since discharge. He denies travel history. He denies headache, sore throat, rhinorrhea, cough, wheezing, hemoptysis, nausea, emesis, diarrhea, constipation, dysuria, frequency or skin rashes. He denies CP, palpitations, dyspnea, diaphoresis, PND or orthopnea. No abdominal or flank pain. No polyuria, polydipsia or  polyphagia.  ED Course: Initial vital signs temperature 102.9 F, pulse 120, respirations 20, blood pressure 169/107 mmHg and O2 sat 98% on room air.  The patient received 650 mg of oral acetaminophen, a 1000 mL of NS bolus, cefepime, IV metronidazole and vancomycin in the ED.  Blood cultures x2 were drawn.  His work-up shows a normal urinalysis, his white count was less than 0.1, hemoglobin 7.3 g/dL and platelets 27.  PT was 15.9 and INR 1.28.  Lactic acid was normal at 1.08 mmol/L.  CMP shows a glucose of 124 and total bilirubin of 1.7 mg/dL.  All other chemistry values are so far normal.  His chest radiograph shows a right jugular Port-A-Cath, but did  not show any acute abnormalities     Hospital Course:   Brief Narrative:  52 year old man admitted from home on 9/8 due to febrile neutropenia.  He was diagnosed with AML in March and currently undergoes treatment at [facility].  His last chemo treatment was 07/13/18.  He had a temperature of 103 at home and was instructed to come to his nearest emergency department.  Blood cultures with ESBL E. Coli.  oncology is on board.   Assessment & Plan:    1)E. coli ESBL bacteremia in the setting of febrile neutropenia-  WBC up to 1.2 from  0.1 after Neupogen on 07/27/2018 and 07/28/18 and 07/29/18,, d/w Dr Scharlene Gloss (ID) on 07/30/18 ,patient was treated with IV meropenem from 07/27/2018 to 07/30/2018, prior to that he was on cefepime and Levaquin, Dr Linus Salmons discharge home on p.o. Bactrim for 5 to 7 days to complete course. Patient has been afebrile for more than 72 hours,     initial admission blood cultures on 07/23/2018 had E. coli ESBL,    repeat blood cultures on 07/27/2018 negative so far ,  UA on admission was negative for infection, repeat UA   on 07/27/2018 also neg  2)Severe Pancytopenia due to chemotherapy -platelet count up to 27K from 11 K posttransfusion,    hemoglobin up to 9.8 from 7.5  (posttransfusion, patient has not received total of 4 units of packed cells  This admission) , denies bleeding at this time,   treated with iv  Meropenem, ID recommends discharge home on p.o. Bactrim, continue,Acyclovir and Diflucan, case d/w  Dr. Walden Field from oncology service on 07/30/18 prior to discharge home,    3)AML-last chemo 07/13/2018, patient apparently got Neulasta on 07/17/2018, usually follows with oncologist at [facility] [place] started on 07/27/2018, last dose 07/30/18  Disposition/-  home   DVT prophylaxis: SCDs Code Status: Full code Family Communication: Patient only  Consultants:   Oncology  ID phone consult  Procedures:   Transfusion     Discharge Condition: stable  Follow UP- Dr Walden Field   Diet and Activity recommendation:  As advised  Discharge Instructions    Discharge Instructions    Call MD for:  difficulty breathing, headache or visual disturbances   Complete by:  As directed    Call MD for:  hives   Complete by:  As directed    Call MD for:  persistant dizziness or light-headedness   Complete by:  As directed    Call MD for:  persistant nausea and vomiting   Complete by:  As directed    Call MD for:  severe uncontrolled pain   Complete by:  As directed    Call MD for:  temperature >100.4   Complete by:  As directed  Diet general   Complete by:  As directed    Discharge instructions   Complete by:  As directed    1)Your platelet count is still low so Avoid ibuprofen/Advil/Aleve/Motrin/Goody Powders/Naproxen/BC powders/Meloxicam/Diclofenac/Indomethacin and other Nonsteroidal anti-inflammatory medications as these will make you more likely to bleed and can cause stomach ulcers, can also cause Kidney problems.   2)Take Bactrim/sulfa antibiotic twice a day for the next week for infection in your blood  3)Return to the oncology/outpatient cancer unit on 07/31/2018 for blood work, you also need to continue your usual blood work and you  on Mondays and Thursdays,  4) Dr Walden Field the Oncologist will tell you  if you needs additional transfusion after your blood work is done on Tuesday, 07/31/2018  5)Also follow-up with your oncologist at [facility] as previously scheduled and advised  6)Repeat CBC and CMP blood work on Tuesday, 07/31/2018 at the cancer/oncology unit here in [place] with Dr. Walden Field   Increase activity slowly   Complete by:  As directed        Discharge Medications     Allergies as of 07/30/2018   No Known Allergies     Medication List    STOP taking these medications   levofloxacin 500 MG tablet Commonly known as:  LEVAQUIN     TAKE these medications   acyclovir 400  MG tablet Commonly known as:  ZOVIRAX Take 1 tablet (400 mg total) by mouth 2 times daily while neutropenic. Your physician will instruct you when to START and STOP taking. **Please start 04/24/18**   fluconazole 200 MG tablet Commonly known as:  DIFLUCAN Take 1 tablet (200 mg total) by mouth daily while neutropenic. Your physician will instruct you when to START and STOP taking. **Please start 04/24/18**   pantoprazole 40 MG tablet Commonly known as:  PROTONIX Take 40 mg by mouth daily.   sulfamethoxazole-trimethoprim 800-160 MG tablet Commonly known as:  BACTRIM DS,SEPTRA DS Take 1 tablet by mouth 2 (two) times daily for 7 days.      Major procedures and Radiology Reports - PLEASE review detailed and final reports for all details, in brief -   Dg Chest 2 View  Result Date: 07/23/2018 CLINICAL DATA:  Fever starting tonight, finished chemotherapy last week, history leukemia EXAM: CHEST - 2 VIEW COMPARISON:  01/21/2018 FINDINGS: RIGHT jugular Port-A-Cath with tip projecting over SVC near cavoatrial junction. Normal heart size, mediastinal contours, and pulmonary vascularity. Lungs clear. No pleural effusion or pneumothorax. Bones unremarkable. IMPRESSION: No acute abnormalities. Electronically Signed   By: Lavonia Dana M.D.   On: 07/23/2018 01:07    Micro Results    Recent Results (from the past 240 hour(s))  Culture, blood (Routine x 2)     Status: Abnormal   Collection Time: 07/23/18 12:00 AM  Result Value Ref Range Status   Specimen Description   Final    PORTA CATH Performed at [facility], [address], [place], [place] [address]    Special Requests   Final    BOTTLES DRAWN AEROBIC AND ANAEROBIC Blood Culture adequate volume Performed at [facility], [address] [place], [place] [address]    Culture  Setup Time   Final    GRAM NEGATIVE RODS BOTH BOTTLES POSITIVE Gram Stain Report Called to,Read Back By and Verified With: BARBARA MORRIS 07/23/18 @ 1320 LLS CRITICAL  RESULT CALLED TO, READ BACK BY AND VERIFIED WITHMiquel Dunn RN 07/23/18 2044 JDW Performed at [facility], [place]  [address] [place], [place] [address]    Culture (A)  Final    ESCHERICHIA COLI SUSCEPTIBILITIES PERFORMED ON PREVIOUS CULTURE WITHIN THE LAST 5 DAYS.    Report Status 07/26/2018 FINAL  Final  Blood Culture ID Panel (Reflexed)     Status: Abnormal   Collection Time: 07/23/18 12:00 AM  Result Value Ref Range Status   Enterococcus species NOT DETECTED NOT DETECTED Final   Listeria monocytogenes NOT DETECTED NOT DETECTED Final   Staphylococcus species NOT DETECTED NOT DETECTED Final   Staphylococcus aureus NOT DETECTED NOT DETECTED Final   Streptococcus species NOT DETECTED NOT DETECTED Final   Streptococcus agalactiae NOT DETECTED NOT DETECTED Final   Streptococcus pneumoniae NOT DETECTED NOT DETECTED Final   Streptococcus pyogenes NOT DETECTED NOT DETECTED Final   Acinetobacter baumannii NOT DETECTED NOT DETECTED Final   Enterobacteriaceae species DETECTED (A) NOT DETECTED Final    Comment: Enterobacteriaceae represent a large family of gram-negative bacteria, not a single organism. CRITICAL RESULT CALLED TO, READ BACK BY AND VERIFIED WITH: Miquel Dunn RN 07/23/18 2044 JDW    Enterobacter cloacae complex NOT DETECTED NOT DETECTED Final   Escherichia coli DETECTED (A) NOT DETECTED Final    Comment: CRITICAL RESULT CALLED TO, READ BACK BY AND VERIFIED WITH: Miquel Dunn RN 07/23/18 2044 JDW    Klebsiella oxytoca NOT DETECTED NOT DETECTED Final   Klebsiella pneumoniae NOT DETECTED NOT DETECTED Final   Proteus species NOT DETECTED NOT DETECTED Final   Serratia marcescens NOT DETECTED NOT DETECTED Final   Carbapenem resistance NOT DETECTED NOT DETECTED Final   Haemophilus influenzae NOT DETECTED NOT DETECTED Final   Neisseria meningitidis NOT DETECTED NOT DETECTED Final   Pseudomonas aeruginosa NOT DETECTED NOT DETECTED Final   Candida albicans NOT DETECTED NOT DETECTED Final    Candida glabrata NOT DETECTED NOT DETECTED Final   Candida krusei NOT DETECTED NOT DETECTED Final   Candida parapsilosis NOT DETECTED NOT DETECTED Final   Candida tropicalis NOT DETECTED NOT DETECTED Final    Comment: Performed at [facility], [place] [address], [place], [place] [address]  Culture, blood (Routine x 2)     Status: Abnormal   Collection Time: 07/23/18 12:17 AM  Result Value Ref Range Status   Specimen Description   Final    BLOOD LEFT HAND Performed at [facility], [address], [place], [place] [address]    Special Requests   Final    BOTTLES DRAWN AEROBIC AND ANAEROBIC Blood Culture adequate volume Performed at [facility] [address], [place], [place] [address]    Culture  Setup Time   Final    GRAM NEGATIVE RODS BOTH BOTTES OF SET Gram Stain Report Called to,Read Back By and Verified With: B MORRIS 07/23/18 @ 1320 LLS CRITICAL RESULT CALLED TO, READ BACK BY AND VERIFIED WITHMiquel Dunn RN 07/23/18 2044 JDW Performed at [facility], [place] [address], [place], [place] [address]    Culture (A)  Final    ESCHERICHIA COLI Confirmed Extended Spectrum Beta-Lactamase Producer (ESBL).  In bloodstream infections from ESBL organisms, carbapenems are preferred over piperacillin/tazobactam. They are shown to have a lower risk of mortality.    Report Status 07/26/2018 FINAL  Final   Organism ID, Bacteria ESCHERICHIA COLI  Final      Susceptibility   Escherichia coli - MIC*    AMPICILLIN >=32 RESISTANT Resistant     CEFAZOLIN >=64 RESISTANT Resistant     CEFEPIME 32 RESISTANT Resistant  CEFTAZIDIME RESISTANT Resistant     CEFTRIAXONE >=64 RESISTANT Resistant     CIPROFLOXACIN >=4 RESISTANT Resistant     GENTAMICIN >=16 RESISTANT Resistant     IMIPENEM <=0.25 SENSITIVE Sensitive     TRIMETH/SULFA 40 SENSITIVE Sensitive     AMPICILLIN/SULBACTAM >=32 RESISTANT Resistant     PIP/TAZO 8 SENSITIVE Sensitive     Extended ESBL POSITIVE Resistant     * ESCHERICHIA  COLI  MRSA PCR Screening     Status: None   Collection Time: 07/23/18  2:37 AM  Result Value Ref Range Status   MRSA by PCR NEGATIVE NEGATIVE Final    Comment:        The GeneXpert MRSA Assay (FDA approved for NASAL specimens only), is one component of a comprehensive MRSA colonization surveillance program. It is not intended to diagnose MRSA infection nor to guide or monitor treatment for MRSA infections. Performed at [facility], [address] [place], [place] [address]   Culture, blood (Routine X 2) w Reflex to ID Panel     Status: None (Preliminary result)   Collection Time: 07/27/18 11:24 AM  Result Value Ref Range Status   Specimen Description   Final    RIGHT ANTECUBITAL BOTTLES DRAWN AEROBIC AND ANAEROBIC   Special Requests Blood Culture adequate volume  Final   Culture  Setup Time PENDING  Incomplete   Culture   Final    NO GROWTH 3 DAYS Performed at [facility], [address], [place], [place] [address]    Report Status PENDING  Incomplete    Today   Subjective    Rayhaan Huster today has no new complaints, no fevers, no chills, no nausea no vomiting or diarrhea, no abdominal pain, no dysuria, no cough Patient is ambulating without hypoxia, he is eager to go home          Patient has been seen and examined prior to discharge   Objective   Blood pressure (!) 144/92, pulse 84, temperature 99.3 F (37.4 C), temperature source Oral, resp. rate 17, height 6\' 1"  (1.854 m), weight 91.2 kg, SpO2 98 %.   Intake/Output Summary (Last 24 hours) at 07/30/2018 1142 Last data filed at 07/30/2018 1010 Gross per 24 hour  Intake 445.12 ml  Output 1300 ml  Net -854.88 ml    Exam Patient is examined daily including today on 07/30/18, exams remain the same as of yesterday except that has changed    General exam: Alert, awake, oriented x 3 Neck- Right-sided Jugular Port-A-Cath site is clean dry and intact  Respiratory system: Clear to auscultation. Respiratory  effort normal. Cardiovascular system:RRR.  No murmurs gastrointestinal system: Abdomen is nondistended, soft and nontender. No organomegaly or masses felt. Normal bowel sounds heard. Central nervous system: Alert and oriented. No focal neurological deficits. Extremities: No C/C/E, +pedal pulses Skin: No rashes, lesions or ulcers Psychiatry: Judgement and insight appear normal. Mood & affect appropriate   Data Review   CBC w Diff:  Lab Results  Component Value Date   WBC 1.2 (LL) 07/30/2018   HGB 9.8 (L) 07/30/2018   HCT 29.1 (L) 07/30/2018   PLT 27 (LL) 07/30/2018   LYMPHOPCT 28 07/29/2018   BANDSPCT 5 01/22/2018   MONOPCT 10 07/29/2018   EOSPCT 3 07/29/2018   BASOPCT 0 07/29/2018    CMP:  Lab Results  Component Value Date   NA 140 07/29/2018   NA 140 06/27/2017   K 3.3 (L) 07/29/2018   CL 103 07/29/2018  CO2 33 (H) 07/29/2018   BUN 8 07/29/2018   BUN 10 06/27/2017   CREATININE 0.75 07/30/2018   CREATININE 0.91 07/05/2014   PROT 6.7 07/30/2018   PROT 7.5 06/27/2017   ALBUMIN 2.7 (L) 07/30/2018   ALBUMIN 4.5 06/27/2017   BILITOT 0.8 07/30/2018   BILITOT 0.9 06/27/2017   ALKPHOS 51 07/30/2018   AST 45 (H) 07/30/2018   ALT 82 (H) 07/30/2018  .   Total Discharge time is about 33 minutes  Roxan Hockey M.D on 07/30/2018 at 11:42 AM  Pager---[phone]  Go to [email or web address] - password [email or web address] for contact info  [facility] - Office  [phone]

## 2018-07-30 NOTE — Discharge Instructions (Signed)
1)Your platelet count is still low so Avoid ibuprofen/Advil/Aleve/Motrin/Goody Powders/Naproxen/BC powders/Meloxicam/Diclofenac/Indomethacin and other Nonsteroidal anti-inflammatory medications as these will make you more likely to bleed and can cause stomach ulcers, can also cause Kidney problems.   2)Take Bactrim/sulfa antibiotic twice a day for the next week for infection in your blood  3)Return to the oncology/outpatient cancer unit on 07/31/2018 for blood work, you also need to continue your usual blood work and you  on Mondays and Thursdays,  4) Dr Walden Field the Oncologist will tell you  if you needs additional transfusion after your blood work is done on Tuesday, 07/31/2018  5)Also follow-up with your oncologist at [facility] as previously scheduled and advised  6)Repeat CBC and CMP blood work on Tuesday, 07/31/2018 at the cancer/oncology unit here in [place] with Dr. Walden Field

## 2018-07-30 NOTE — Progress Notes (Signed)
CRITICAL VALUE ALERT  Critical Value:  WBC 1.2, Platelets 27  Date & Time Notied:  07/30/18 @0925   Provider Notified: Dr. Denton Brick  Orders Received/Actions taken: No new orders currently

## 2018-07-31 ENCOUNTER — Inpatient Hospital Stay (HOSPITAL_COMMUNITY): Payer: BC Managed Care – PPO

## 2018-07-31 DIAGNOSIS — Z5189 Encounter for other specified aftercare: Secondary | ICD-10-CM | POA: Diagnosis not present

## 2018-07-31 DIAGNOSIS — C9200 Acute myeloblastic leukemia, not having achieved remission: Secondary | ICD-10-CM

## 2018-07-31 DIAGNOSIS — C9201 Acute myeloblastic leukemia, in remission: Secondary | ICD-10-CM | POA: Diagnosis not present

## 2018-07-31 LAB — COMPREHENSIVE METABOLIC PANEL
ALT: 92 U/L — AB (ref 0–44)
AST: 44 U/L — AB (ref 15–41)
Albumin: 3.1 g/dL — ABNORMAL LOW (ref 3.5–5.0)
Alkaline Phosphatase: 64 U/L (ref 38–126)
Anion gap: 10 (ref 5–15)
BUN: 11 mg/dL (ref 6–20)
CHLORIDE: 101 mmol/L (ref 98–111)
CO2: 26 mmol/L (ref 22–32)
CREATININE: 0.98 mg/dL (ref 0.61–1.24)
Calcium: 9 mg/dL (ref 8.9–10.3)
GFR calc Af Amer: >60 mL/min (ref >60)
GFR calc non Af Amer: >60 mL/min (ref >60)
Glucose, Bld: 134 mg/dL — ABNORMAL HIGH (ref 70–99)
Potassium: 4 mmol/L (ref 3.5–5.1)
SODIUM: 137 mmol/L (ref 135–145)
Total Bilirubin: 0.7 mg/dL (ref 0.3–1.2)
Total Protein: 7.4 g/dL (ref 6.5–8.1)

## 2018-07-31 LAB — CBC WITH DIFFERENTIAL/PLATELET
BASOS ABS: 0 10*3/uL (ref 0.0–0.1)
Basophils Relative: 0 %
EOS ABS: 0 10*3/uL (ref 0.0–0.7)
Eosinophils Relative: 0 %
HCT: 29.6 % — ABNORMAL LOW (ref 39.0–52.0)
Hemoglobin: 9.7 g/dL — ABNORMAL LOW (ref 13.0–17.0)
Lymphocytes Relative: 10 %
Lymphs Abs: 0.2 10*3/uL — ABNORMAL LOW (ref 0.7–4.0)
MCH: 31.2 pg (ref 26.0–34.0)
MCHC: 32.8 g/dL (ref 30.0–36.0)
MCV: 95.2 fL (ref 78.0–100.0)
Monocytes Absolute: 0.1 10*3/uL (ref 0.1–1.0)
Monocytes Relative: 7 %
NEUTROS PCT: 83 %
Neutro Abs: 1.8 10*3/uL (ref 1.7–7.7)
PLATELETS: 19 10*3/uL — AB (ref 150–400)
RBC: 3.11 MIL/uL — AB (ref 4.22–5.81)
RDW: 13.5 % (ref 11.5–15.5)
WBC: 2.1 10*3/uL — AB (ref 4.0–10.5)

## 2018-07-31 LAB — MAGNESIUM: Magnesium: 2 mg/dL (ref 1.7–2.4)

## 2018-07-31 NOTE — Progress Notes (Signed)
CRITICAL VALUE ALERT Critical value received:  Platelets-19 Date of notification:  07/31/18 Time of notification: 9234 Critical value read back:  Yes.   Nurse who received alert:  M.Edis Huish,LPN MD notified (1st page):   V.Higgs, MD

## 2018-08-02 ENCOUNTER — Encounter (HOSPITAL_COMMUNITY): Payer: Self-pay | Admitting: *Deleted

## 2018-08-02 ENCOUNTER — Inpatient Hospital Stay (HOSPITAL_COMMUNITY): Payer: BC Managed Care – PPO

## 2018-08-02 DIAGNOSIS — C9200 Acute myeloblastic leukemia, not having achieved remission: Secondary | ICD-10-CM

## 2018-08-02 DIAGNOSIS — C9201 Acute myeloblastic leukemia, in remission: Secondary | ICD-10-CM | POA: Diagnosis not present

## 2018-08-02 LAB — CBC WITH DIFFERENTIAL/PLATELET
BASOS PCT: 0 %
Basophils Absolute: 0 10*3/uL (ref 0.0–0.1)
Eosinophils Absolute: 0 10*3/uL (ref 0.0–0.7)
Eosinophils Relative: 0 %
HCT: 29.3 % — ABNORMAL LOW (ref 39.0–52.0)
Hemoglobin: 9.5 g/dL — ABNORMAL LOW (ref 13.0–17.0)
Lymphocytes Relative: 10 %
Lymphs Abs: 0.3 10*3/uL — ABNORMAL LOW (ref 0.7–4.0)
MCH: 31 pg (ref 26.0–34.0)
MCHC: 32.4 g/dL (ref 30.0–36.0)
MCV: 95.8 fL (ref 78.0–100.0)
MONO ABS: 0.2 10*3/uL (ref 0.1–1.0)
Monocytes Relative: 7 %
NEUTROS ABS: 2.9 10*3/uL (ref 1.7–7.7)
NEUTROS PCT: 83 %
Platelets: 27 10*3/uL — CL (ref 150–400)
RBC: 3.06 MIL/uL — ABNORMAL LOW (ref 4.22–5.81)
RDW: 14.3 % (ref 11.5–15.5)
WBC: 3.4 10*3/uL — ABNORMAL LOW (ref 4.0–10.5)

## 2018-08-02 LAB — COMPREHENSIVE METABOLIC PANEL
ALBUMIN: 3.3 g/dL — AB (ref 3.5–5.0)
ALT: 101 U/L — ABNORMAL HIGH (ref 0–44)
ANION GAP: 7 (ref 5–15)
AST: 41 U/L (ref 15–41)
Alkaline Phosphatase: 72 U/L (ref 38–126)
BUN: 8 mg/dL (ref 6–20)
CHLORIDE: 105 mmol/L (ref 98–111)
CO2: 27 mmol/L (ref 22–32)
Calcium: 9.1 mg/dL (ref 8.9–10.3)
Creatinine, Ser: 1.01 mg/dL (ref 0.61–1.24)
GFR calc Af Amer: >60 mL/min (ref >60)
GFR calc non Af Amer: >60 mL/min (ref >60)
GLUCOSE: 136 mg/dL — AB (ref 70–99)
POTASSIUM: 4.1 mmol/L (ref 3.5–5.1)
SODIUM: 139 mmol/L (ref 135–145)
Total Bilirubin: 0.4 mg/dL (ref 0.3–1.2)
Total Protein: 7.6 g/dL (ref 6.5–8.1)

## 2018-08-02 LAB — MAGNESIUM: Magnesium: 2.1 mg/dL (ref 1.7–2.4)

## 2018-08-02 NOTE — Progress Notes (Unsigned)
CRITICAL VALUE ALERT Critical value received:  Platelets 27,000 Date of notification:  08-02-2018 Time of notification: 6116 Critical value read back:  Yes.   Nurse who received alert:  C. Chalsea Darko RN MD notified (1st [place] Shipper):  Dr. Walden Field

## 2018-08-02 NOTE — Progress Notes (Signed)
Critical value called to Eilene Ghazi, RN at [facility]. Platelet count 27K.   No orders received at this time.

## 2018-08-04 LAB — CULTURE, BLOOD (ROUTINE X 2)
CULTURE: NO GROWTH
Special Requests: ADEQUATE

## 2018-08-06 ENCOUNTER — Other Ambulatory Visit (HOSPITAL_COMMUNITY): Payer: BC Managed Care – PPO

## 2018-08-09 ENCOUNTER — Inpatient Hospital Stay (HOSPITAL_COMMUNITY): Payer: BC Managed Care – PPO

## 2018-08-09 DIAGNOSIS — C9200 Acute myeloblastic leukemia, not having achieved remission: Secondary | ICD-10-CM

## 2018-08-09 DIAGNOSIS — C9201 Acute myeloblastic leukemia, in remission: Secondary | ICD-10-CM | POA: Diagnosis not present

## 2018-08-09 LAB — CBC WITH DIFFERENTIAL/PLATELET
BASOS PCT: 0 %
Basophils Absolute: 0 10*3/uL (ref 0.0–0.1)
Eosinophils Absolute: 0 10*3/uL (ref 0.0–0.7)
Eosinophils Relative: 0 %
HEMATOCRIT: 28.7 % — AB (ref 39.0–52.0)
HEMOGLOBIN: 9.4 g/dL — AB (ref 13.0–17.0)
Lymphocytes Relative: 11 %
Lymphs Abs: 0.4 10*3/uL — ABNORMAL LOW (ref 0.7–4.0)
MCH: 31.3 pg (ref 26.0–34.0)
MCHC: 32.8 g/dL (ref 30.0–36.0)
MCV: 95.7 fL (ref 78.0–100.0)
MONOS PCT: 21 %
Monocytes Absolute: 0.8 10*3/uL (ref 0.1–1.0)
NEUTROS ABS: 2.6 10*3/uL (ref 1.7–7.7)
NEUTROS PCT: 68 %
Platelets: 100 10*3/uL — ABNORMAL LOW (ref 150–400)
RBC: 3 MIL/uL — AB (ref 4.22–5.81)
RDW: 14.2 % (ref 11.5–15.5)
WBC: 3.8 10*3/uL — AB (ref 4.0–10.5)

## 2018-08-09 LAB — COMPREHENSIVE METABOLIC PANEL
ALBUMIN: 3.3 g/dL — AB (ref 3.5–5.0)
ALK PHOS: 78 U/L (ref 38–126)
ALT: 38 U/L (ref 0–44)
ANION GAP: 6 (ref 5–15)
AST: 21 U/L (ref 15–41)
BILIRUBIN TOTAL: 0.5 mg/dL (ref 0.3–1.2)
BUN: 7 mg/dL (ref 6–20)
CALCIUM: 9.1 mg/dL (ref 8.9–10.3)
CO2: 27 mmol/L (ref 22–32)
CREATININE: 0.73 mg/dL (ref 0.61–1.24)
Chloride: 106 mmol/L (ref 98–111)
GFR calc Af Amer: >60 mL/min (ref >60)
GFR calc non Af Amer: >60 mL/min (ref >60)
GLUCOSE: 108 mg/dL — AB (ref 70–99)
Potassium: 3.5 mmol/L (ref 3.5–5.1)
SODIUM: 139 mmol/L (ref 135–145)
TOTAL PROTEIN: 7.6 g/dL (ref 6.5–8.1)

## 2018-08-09 LAB — MAGNESIUM: Magnesium: 1.8 mg/dL (ref 1.7–2.4)

## 2018-08-13 ENCOUNTER — Other Ambulatory Visit (HOSPITAL_COMMUNITY): Payer: BC Managed Care – PPO

## 2018-08-16 ENCOUNTER — Other Ambulatory Visit (HOSPITAL_COMMUNITY): Payer: BC Managed Care – PPO

## 2018-10-08 ENCOUNTER — Encounter (HOSPITAL_COMMUNITY): Payer: Self-pay | Admitting: *Deleted

## 2018-10-08 DIAGNOSIS — C9200 Acute myeloblastic leukemia, not having achieved remission: Secondary | ICD-10-CM

## 2018-10-08 NOTE — Progress Notes (Signed)
Orders received from [facility] for patient to have CBC with diff and platelet count to be  drawn 10/26/18 for one time.  Orders are in and appt made. Patient is aware.

## 2018-10-16 ENCOUNTER — Other Ambulatory Visit (HOSPITAL_COMMUNITY): Payer: BC Managed Care – PPO

## 2018-10-26 ENCOUNTER — Inpatient Hospital Stay (HOSPITAL_COMMUNITY): Payer: BC Managed Care – PPO | Attending: Hematology

## 2018-10-26 DIAGNOSIS — C9200 Acute myeloblastic leukemia, not having achieved remission: Secondary | ICD-10-CM | POA: Diagnosis not present

## 2018-10-26 LAB — CBC WITH DIFFERENTIAL/PLATELET
Abs Immature Granulocytes: 0.02 10*3/uL (ref 0.00–0.07)
BASOS ABS: 0 10*3/uL (ref 0.0–0.1)
BASOS PCT: 0 %
Eosinophils Absolute: 0 10*3/uL (ref 0.0–0.5)
Eosinophils Relative: 1 %
HCT: 39.2 % (ref 39.0–52.0)
Hemoglobin: 12.6 g/dL — ABNORMAL LOW (ref 13.0–17.0)
Immature Granulocytes: 1 %
LYMPHS ABS: 0.7 10*3/uL (ref 0.7–4.0)
LYMPHS PCT: 21 %
MCH: 30.4 pg (ref 26.0–34.0)
MCHC: 32.1 g/dL (ref 30.0–36.0)
MCV: 94.7 fL (ref 80.0–100.0)
MONOS PCT: 12 %
Monocytes Absolute: 0.4 10*3/uL (ref 0.1–1.0)
NEUTROS PCT: 65 %
Neutro Abs: 2 10*3/uL (ref 1.7–7.7)
PLATELETS: 140 10*3/uL — AB (ref 150–400)
RBC: 4.14 MIL/uL — ABNORMAL LOW (ref 4.22–5.81)
RDW: 12.7 % (ref 11.5–15.5)
WBC: 3.1 10*3/uL — ABNORMAL LOW (ref 4.0–10.5)
nRBC: 0 % (ref 0.0–0.2)

## 2019-04-13 IMAGING — DX DG CHEST 2V
2 series · 2 of 2 positions shown · non-contrast
Comparison: None.

CLINICAL DATA: Generalized weakness.

EXAM:
CHEST - 2 VIEW

[chest pa]
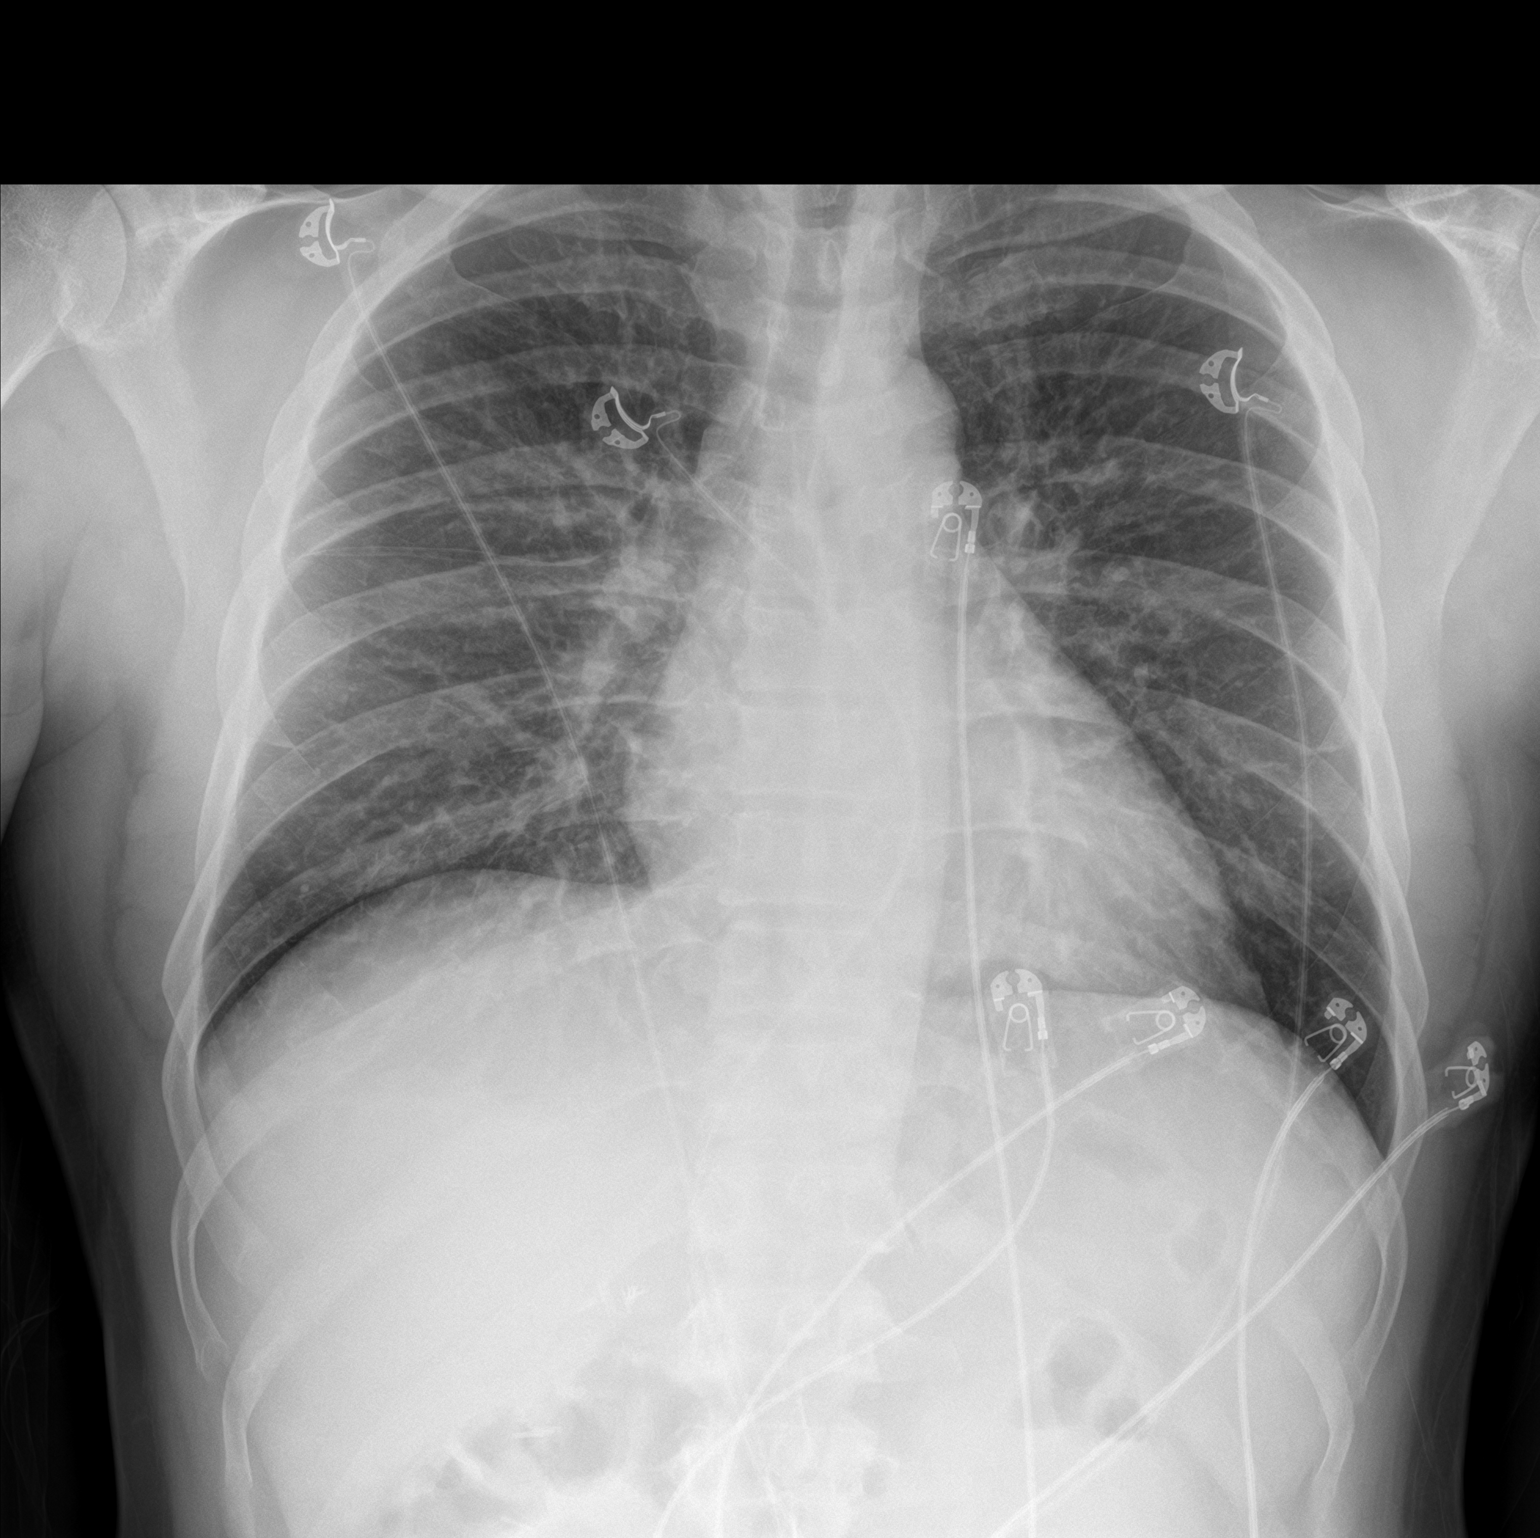

[chest lat]
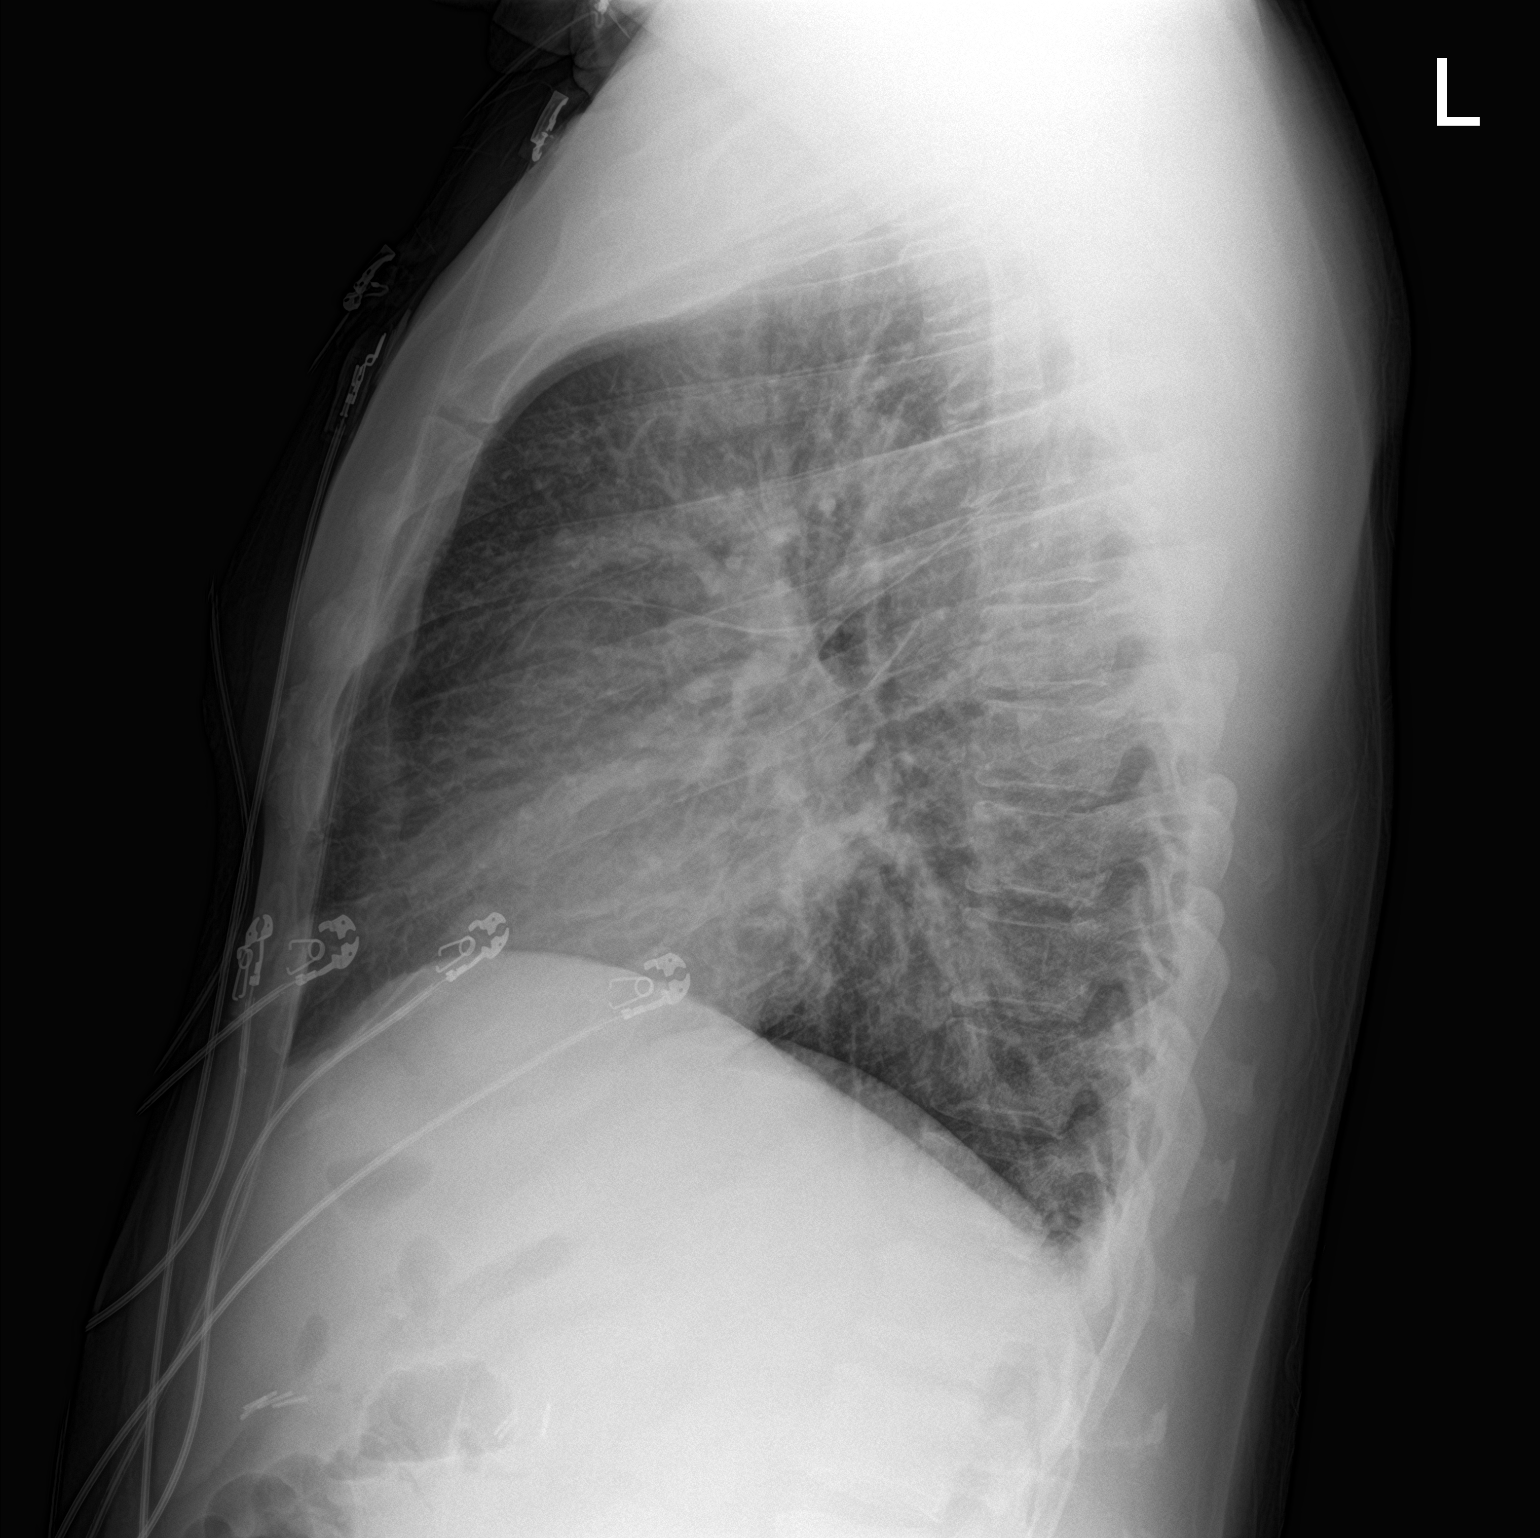

[2 of 2 positions shown; findings below may reference images not displayed]

FINDINGS: The heart size and mediastinal contours are within normal limits.
Mildly increased interstitial markings. No focal consolidation,
pleural effusion, or pneumothorax. No acute osseous abnormality.
IMPRESSION: 1. Mildly increased interstitial markings which can be seen with
edema or interstitial pneumonia.

## 2019-05-14 ENCOUNTER — Other Ambulatory Visit: Payer: BC Managed Care – PPO

## 2019-05-14 ENCOUNTER — Other Ambulatory Visit: Payer: Self-pay

## 2019-05-14 DIAGNOSIS — Z20822 Contact with and (suspected) exposure to covid-19: Secondary | ICD-10-CM

## 2019-05-16 LAB — NOVEL CORONAVIRUS, NAA: SARS-CoV-2, NAA: NOT DETECTED

## 2019-05-31 ENCOUNTER — Ambulatory Visit (INDEPENDENT_AMBULATORY_CARE_PROVIDER_SITE_OTHER): Payer: BC Managed Care – PPO | Admitting: Family Medicine

## 2019-05-31 ENCOUNTER — Other Ambulatory Visit: Payer: Self-pay

## 2019-05-31 ENCOUNTER — Encounter: Payer: Self-pay | Admitting: Family Medicine

## 2019-05-31 VITALS — BP 140/86 | Temp 97.8°F | Ht 73.0 in | Wt 231.0 lb

## 2019-05-31 DIAGNOSIS — N5201 Erectile dysfunction due to arterial insufficiency: Secondary | ICD-10-CM

## 2019-05-31 DIAGNOSIS — Z0000 Encounter for general adult medical examination without abnormal findings: Secondary | ICD-10-CM

## 2019-05-31 DIAGNOSIS — I10 Essential (primary) hypertension: Secondary | ICD-10-CM

## 2019-05-31 DIAGNOSIS — Z125 Encounter for screening for malignant neoplasm of prostate: Secondary | ICD-10-CM

## 2019-05-31 MED ORDER — SILDENAFIL CITRATE 20 MG PO TABS: ORAL_TABLET | ORAL | 6 refills | Status: DC

## 2019-05-31 MED ORDER — FAMOTIDINE 40 MG PO TABS: ORAL_TABLET | ORAL | 11 refills | Status: AC

## 2019-05-31 NOTE — Progress Notes (Signed)
Subjective:    Patient ID: Bryce Hamilton, male    DOB: [birth year], 53 y.o.   MRN: [id number]  HPI The patient comes in today for a wellness visit.    A review of their health history was completed.  A review of medications was also completed.  Any needed refills; none  Eating habits: trying to eat healthy; pt has started to do a Keto diet  Falls/  MVA accidents in past few months: none  Regular exercise: not like pt should but is trying to get better  Specialist pt sees on regular basis: See specialist at [facility] for Leukemia   Preventative health issues were discussed.   Additional concerns: none at this time  Wife had pos  Corona virus and so pt was screende. And it was negative  Exercise not the best could be better   Patient has been having progressive difficulty with erectile dysfunction.  He feels this time he definitely gets on and treats this.  We have discussed that in the past.  He wonders about testosterone supplement.  He wonders about the medications which may work better.  This is been progressing over the last several years but now is substantial and affecting his sex life   Needs b  w   Review of Systems  Constitutional: Negative for activity change, appetite change and fever.  HENT: Negative for congestion and rhinorrhea.   Eyes: Negative for discharge.  Respiratory: Negative for cough and wheezing.   Cardiovascular: Negative for chest pain.  Gastrointestinal: Negative for abdominal pain, blood in stool and vomiting.  Genitourinary: Negative for difficulty urinating and frequency.  Musculoskeletal: Negative for neck pain.  Skin: Negative for rash.  Allergic/Immunologic: Negative for environmental allergies and food allergies.  Neurological: Negative for weakness and headaches.  Psychiatric/Behavioral: Negative for agitation.  All other systems reviewed and are negative.      Objective:   Physical Exam Vitals signs reviewed.  Constitutional:       Appearance: He is well-developed.  HENT:     Head: Normocephalic and atraumatic.     Right Ear: External ear normal.     Left Ear: External ear normal.     Nose: Nose normal.  Eyes:     Pupils: Pupils are equal, round, and reactive to light.  Neck:     Musculoskeletal: Normal range of motion and neck supple.     Thyroid: No thyromegaly.  Cardiovascular:     Rate and Rhythm: Normal rate and regular rhythm.     Heart sounds: Normal heart sounds. No murmur.  Pulmonary:     Effort: Pulmonary effort is normal. No respiratory distress.     Breath sounds: Normal breath sounds. No wheezing.  Abdominal:     General: Bowel sounds are normal. There is no distension.     Palpations: Abdomen is soft. There is no mass.     Tenderness: There is no abdominal tenderness.  Genitourinary:    Penis: Normal.   Musculoskeletal: Normal range of motion.  Lymphadenopathy:     Cervical: No cervical adenopathy.  Skin:    General: Skin is warm and dry.     Findings: No erythema.  Neurological:     Mental Status: He is alert.     Motor: No abnormal muscle tone.  Psychiatric:        Behavior: Behavior normal.        Judgment: Judgment normal.           Assessment & Plan:  Impression 1 wellness exam.  Diet discussed.  Exercise discussed.  Colon  Done three yrs go.  Blood work discussed and ordered  2.  Erectile dysfunction.  Substantial problem for patient long discussion held.  Not a good candidate for testosterone supplement.  Rationale discussed.  We will press on use proper medication.  Side effects benefits discussed

## 2019-06-01 LAB — BASIC METABOLIC PANEL
BUN/Creatinine Ratio: 9 (ref 9–20)
BUN: 8 mg/dL (ref 6–24)
CO2: 25 mmol/L (ref 20–29)
Calcium: 9.6 mg/dL (ref 8.7–10.2)
Chloride: 100 mmol/L (ref 96–106)
Creatinine, Ser: 0.91 mg/dL (ref 0.76–1.27)
GFR calc Af Amer: 110 mL/min/{1.73_m2} (ref >59)
GFR calc non Af Amer: 95 mL/min/{1.73_m2} (ref >59)
Glucose: 101 mg/dL — ABNORMAL HIGH (ref 65–99)
Potassium: 3.7 mmol/L (ref 3.5–5.2)
Sodium: 143 mmol/L (ref 134–144)

## 2019-06-01 LAB — LIPID PANEL
Chol/HDL Ratio: 3.5 ratio (ref 0.0–5.0)
Cholesterol, Total: 219 mg/dL — ABNORMAL HIGH (ref 100–199)
HDL: 62 mg/dL (ref >39)
LDL Calculated: 136 mg/dL — ABNORMAL HIGH (ref 0–99)
Triglycerides: 104 mg/dL (ref 0–149)
VLDL Cholesterol Cal: 21 mg/dL (ref 5–40)

## 2019-06-01 LAB — HEPATIC FUNCTION PANEL
ALT: 35 IU/L (ref 0–44)
AST: 27 IU/L (ref 0–40)
Albumin: 4.7 g/dL (ref 3.8–4.9)
Alkaline Phosphatase: 60 IU/L (ref 39–117)
Bilirubin Total: 0.8 mg/dL (ref 0.0–1.2)
Bilirubin, Direct: 0.17 mg/dL (ref 0.00–0.40)
Total Protein: 7.3 g/dL (ref 6.0–8.5)

## 2019-06-01 LAB — PSA: Prostate Specific Ag, Serum: 0.9 ng/mL (ref 0.0–4.0)

## 2019-06-05 ENCOUNTER — Encounter: Payer: Self-pay | Admitting: Family Medicine

## 2019-09-12 ENCOUNTER — Other Ambulatory Visit: Payer: Self-pay | Admitting: Family Medicine

## 2019-11-05 ENCOUNTER — Other Ambulatory Visit: Payer: Self-pay

## 2019-11-05 ENCOUNTER — Ambulatory Visit (INDEPENDENT_AMBULATORY_CARE_PROVIDER_SITE_OTHER): Payer: BC Managed Care – PPO | Admitting: Family Medicine

## 2019-11-05 DIAGNOSIS — I10 Essential (primary) hypertension: Secondary | ICD-10-CM | POA: Diagnosis not present

## 2019-11-05 MED ORDER — AMLODIPINE BESYLATE 5 MG PO TABS: 5.0000 mg | ORAL_TABLET | Freq: Every day | ORAL | 5 refills | Status: DC

## 2019-11-05 NOTE — Progress Notes (Signed)
   Subjective:  Audio 1  Patient ID: Bryce Hamilton, male    DOB: December 27, 1965, 53 y.o.   MRN: [id number]  HPIpt was seeing specialist for AMl and was told he was concerned about his bp. Pt checked bp yesterday am and it was 146/91 in the left arm and in the right arm 163/108. Then checked in the evening and 152 /95 in left arm and 142/98 in the right arm. This morning 135/92 in left arm and 116/79 in right arm. Not doing any regular exercise. Tries to eat healthy. Has gotten off track with diet. Eats a lot of fast food.   Virtual Visit via Telephone Note  I connected with Bryce Hamilton on 11/05/19 at  9:30 AM EST by telephone and verified that I am speaking with the correct person using two identifiers.  Location: Patient: home Provider: office   I discussed the limitations, risks, security and privacy concerns of performing an evaluation and management service by telephone and the availability of in person appointments. I also discussed with the patient that there may be a patient responsible charge related to this service. The patient expressed understanding and agreed to proceed.   History of Present Illness:    Observations/Objective:   Assessment and Plan:   Follow Up Instructions:    I discussed the assessment and treatment plan with the patient. The patient was provided an opportunity to ask questions and all were answered. The patient agreed with the plan and demonstrated an understanding of the instructions.   The patient was advised to call back or seek an in-person evaluation if the symptoms worsen or if the condition fails to improve as anticipated.  I provided 18 minutes of non-face-to-face time during this encounter.  Recent notes reviewed over the past year.  Blood pressure has been rising steadily.  Patient's recent blood pressure numbers substantially elevated  Mostly watching a decent diet.  Exercising some not as much as he used to Review of Systems No  headache, no major weight loss or weight gain, no chest pain no back pain abdominal pain no change in bowel habits complete ROS otherwise negative     Objective:   Physical Exam  Virtual      Assessment & Plan:  Impression essential hypertension.  Discussed.  Time for medication.  Will initiate.  Rationale discussed.  Side effects benefits discussed

## 2019-11-08 ENCOUNTER — Encounter: Payer: Self-pay | Admitting: Family Medicine

## 2019-12-18 ENCOUNTER — Encounter: Payer: Self-pay | Admitting: Family Medicine

## 2020-01-19 ENCOUNTER — Ambulatory Visit: Payer: BC Managed Care – PPO | Attending: Internal Medicine

## 2020-01-19 DIAGNOSIS — Z23 Encounter for immunization: Secondary | ICD-10-CM | POA: Insufficient documentation

## 2020-01-19 NOTE — Progress Notes (Signed)
   Covid-19 Vaccination Clinic  Name:  DEMECIO GILES    MRN: [id number] DOB: Apr 13, 1966  01/19/2020  Bryce Hamilton was observed post Covid-19 immunization for 15 minutes without incident. He was provided with Vaccine Information Sheet and instruction to access the V-Safe system.   Bryce Hamilton was instructed to call 911 with any severe reactions post vaccine: Marland Kitchen Difficulty breathing  . Swelling of face and throat  . A fast heartbeat  . A bad rash all over body  . Dizziness and weakness   Immunizations Administered    Name Date Dose VIS Date Route   Pfizer COVID-19 Vaccine 01/19/2020 12:45 PM 0.3 mL 10/25/2019 Intramuscular   Manufacturer: [employer]   Lot: [id number]   [place]: [id number]

## 2020-02-09 ENCOUNTER — Other Ambulatory Visit: Payer: Self-pay | Admitting: Family Medicine

## 2020-02-09 ENCOUNTER — Ambulatory Visit: Payer: BC Managed Care – PPO | Attending: Internal Medicine

## 2020-02-09 DIAGNOSIS — Z23 Encounter for immunization: Secondary | ICD-10-CM

## 2020-02-09 NOTE — Progress Notes (Signed)
   Covid-19 Vaccination Clinic  Name:  Bryce Hamilton    MRN: [id number] DOB: 1966-11-08  02/09/2020  Mr. [name] was observed post Covid-19 immunization for 15 minutes without incident. He was provided with Vaccine Information Sheet and instruction to access the V-Safe system.   Mr. [name] was instructed to call 911 with any severe reactions post vaccine: Marland Kitchen Difficulty breathing  . Swelling of face and throat  . A fast heartbeat  . A bad rash all over body  . Dizziness and weakness   Immunizations Administered    Name Date Dose VIS Date Route   Pfizer COVID-19 Vaccine 02/09/2020 11:26 AM 0.3 mL 10/25/2019 Intramuscular   Manufacturer: [place]   Lot: [id number]   [place]: [id number]

## 2020-04-29 ENCOUNTER — Inpatient Hospital Stay (HOSPITAL_COMMUNITY): Payer: BC Managed Care – PPO | Attending: Hematology

## 2020-04-29 ENCOUNTER — Other Ambulatory Visit: Payer: Self-pay

## 2020-04-29 DIAGNOSIS — C9200 Acute myeloblastic leukemia, not having achieved remission: Secondary | ICD-10-CM | POA: Diagnosis not present

## 2020-04-29 DIAGNOSIS — D649 Anemia, unspecified: Secondary | ICD-10-CM

## 2020-04-29 DIAGNOSIS — D696 Thrombocytopenia, unspecified: Secondary | ICD-10-CM

## 2020-04-29 LAB — COMPREHENSIVE METABOLIC PANEL
ALT: 50 U/L — ABNORMAL HIGH (ref 0–44)
AST: 43 U/L — ABNORMAL HIGH (ref 15–41)
Albumin: 3.4 g/dL — ABNORMAL LOW (ref 3.5–5.0)
Alkaline Phosphatase: 59 U/L (ref 38–126)
Anion gap: 12 (ref 5–15)
BUN: 16 mg/dL (ref 6–20)
CO2: 24 mmol/L (ref 22–32)
Calcium: 8.9 mg/dL (ref 8.9–10.3)
Chloride: 99 mmol/L (ref 98–111)
Creatinine, Ser: 1.06 mg/dL (ref 0.61–1.24)
GFR calc Af Amer: >60 mL/min (ref >60)
GFR calc non Af Amer: >60 mL/min (ref >60)
Glucose, Bld: 158 mg/dL — ABNORMAL HIGH (ref 70–99)
Potassium: 3.6 mmol/L (ref 3.5–5.1)
Sodium: 135 mmol/L (ref 135–145)
Total Bilirubin: 0.9 mg/dL (ref 0.3–1.2)
Total Protein: 8.1 g/dL (ref 6.5–8.1)

## 2020-04-29 LAB — URIC ACID: Uric Acid, Serum: 3.5 mg/dL — ABNORMAL LOW (ref 3.7–8.6)

## 2020-04-29 LAB — MAGNESIUM: Magnesium: 1.8 mg/dL (ref 1.7–2.4)

## 2020-04-29 LAB — LACTATE DEHYDROGENASE: LDH: 448 U/L — ABNORMAL HIGH (ref 98–192)

## 2020-04-29 LAB — PREPARE RBC (CROSSMATCH)

## 2020-04-29 NOTE — Progress Notes (Unsigned)
CRITICAL VALUE ALERT  Critical Value:  hgb 6.5; platelets 6  Date & Time Notied:  04/29/2020 at 1004  Provider Notified: Cristela Felt, NP  Orders Received/Actions taken: transfuse 1 unit PRBC and 1 unit platelets (both irradiated) per standing orders from [facility].

## 2020-04-30 ENCOUNTER — Other Ambulatory Visit: Payer: Self-pay

## 2020-04-30 ENCOUNTER — Emergency Department (HOSPITAL_COMMUNITY): Payer: BC Managed Care – PPO

## 2020-04-30 ENCOUNTER — Encounter (HOSPITAL_COMMUNITY): Payer: Self-pay | Admitting: Emergency Medicine

## 2020-04-30 ENCOUNTER — Encounter (HOSPITAL_COMMUNITY): Payer: BC Managed Care – PPO

## 2020-04-30 ENCOUNTER — Emergency Department (HOSPITAL_COMMUNITY)
Admission: EM | Admit: 2020-04-30 | Discharge: 2020-04-30 | Disposition: A | Discharge status: Home / Self Care | Payer: BC Managed Care – PPO | Attending: Emergency Medicine | Admitting: Emergency Medicine

## 2020-04-30 DIAGNOSIS — Z20822 Contact with and (suspected) exposure to covid-19: Secondary | ICD-10-CM | POA: Diagnosis not present

## 2020-04-30 DIAGNOSIS — R509 Fever, unspecified: Secondary | ICD-10-CM | POA: Diagnosis not present

## 2020-04-30 DIAGNOSIS — C9202 Acute myeloblastic leukemia, in relapse: Secondary | ICD-10-CM | POA: Diagnosis not present

## 2020-04-30 DIAGNOSIS — D649 Anemia, unspecified: Secondary | ICD-10-CM

## 2020-04-30 DIAGNOSIS — R55 Syncope and collapse: Secondary | ICD-10-CM | POA: Diagnosis present

## 2020-04-30 DIAGNOSIS — Z9221 Personal history of antineoplastic chemotherapy: Secondary | ICD-10-CM | POA: Insufficient documentation

## 2020-04-30 DIAGNOSIS — D696 Thrombocytopenia, unspecified: Secondary | ICD-10-CM

## 2020-04-30 LAB — PREPARE RBC (CROSSMATCH)

## 2020-04-30 LAB — PREPARE PLATELET PHERESIS
Unit division: 0
Unit division: 0

## 2020-04-30 LAB — COMPREHENSIVE METABOLIC PANEL
ALT: 187 U/L — ABNORMAL HIGH (ref 0–44)
AST: 203 U/L — ABNORMAL HIGH (ref 15–41)
Albumin: 3 g/dL — ABNORMAL LOW (ref 3.5–5.0)
Alkaline Phosphatase: 57 U/L (ref 38–126)
Anion gap: 11 (ref 5–15)
BUN: 17 mg/dL (ref 6–20)
CO2: 23 mmol/L (ref 22–32)
Calcium: 8.1 mg/dL — ABNORMAL LOW (ref 8.9–10.3)
Chloride: 98 mmol/L (ref 98–111)
Creatinine, Ser: 0.95 mg/dL (ref 0.61–1.24)
GFR calc Af Amer: >60 mL/min (ref >60)
GFR calc non Af Amer: >60 mL/min (ref >60)
Glucose, Bld: 148 mg/dL — ABNORMAL HIGH (ref 70–99)
Potassium: 3.4 mmol/L — ABNORMAL LOW (ref 3.5–5.1)
Sodium: 132 mmol/L — ABNORMAL LOW (ref 135–145)
Total Bilirubin: 0.8 mg/dL (ref 0.3–1.2)
Total Protein: 7.6 g/dL (ref 6.5–8.1)

## 2020-04-30 LAB — CBC WITH DIFFERENTIAL/PLATELET
Abs Immature Granulocytes: 0.29 10*3/uL — ABNORMAL HIGH (ref 0.00–0.07)
Basophils Absolute: 0 10*3/uL (ref 0.0–0.1)
Basophils Absolute: 0 10*3/uL (ref 0.0–0.1)
Basophils Relative: 0 %
Basophils Relative: 0 %
Blasts: 66 %
Eosinophils Absolute: 0 10*3/uL (ref 0.0–0.5)
Eosinophils Absolute: 0 10*3/uL (ref 0.0–0.5)
Eosinophils Relative: 0 %
Eosinophils Relative: 0 %
HCT: 19.4 % — ABNORMAL LOW (ref 39.0–52.0)
HCT: 15.3 % — ABNORMAL LOW (ref 39.0–52.0)
Hemoglobin: 6.5 g/dL — CL (ref 13.0–17.0)
Hemoglobin: 5.3 g/dL — CL (ref 13.0–17.0)
Immature Granulocytes: 3 %
Lymphocytes Relative: 18 %
Lymphocytes Relative: 33 %
Lymphs Abs: 3.5 10*3/uL (ref 0.7–4.0)
Lymphs Abs: 3.5 10*3/uL (ref 0.7–4.0)
MCH: 30 pg (ref 26.0–34.0)
MCH: 30.5 pg (ref 26.0–34.0)
MCHC: 33.5 g/dL (ref 30.0–36.0)
MCHC: 34.6 g/dL (ref 30.0–36.0)
MCV: 89.4 fL (ref 80.0–100.0)
MCV: 87.9 fL (ref 80.0–100.0)
Monocytes Absolute: 1.1 10*3/uL — ABNORMAL HIGH (ref 0.1–1.0)
Monocytes Absolute: 6.2 10*3/uL — ABNORMAL HIGH (ref 0.1–1.0)
Monocytes Relative: 3 %
Monocytes Relative: 57 %
Myelocytes: 2 %
Neutro Abs: 1.3 10*3/uL — ABNORMAL LOW (ref 1.7–7.7)
Neutro Abs: 0.8 10*3/uL — ABNORMAL LOW (ref 1.7–7.7)
Neutrophils Relative %: 11 %
Neutrophils Relative %: 7 %
Platelets: 6 10*3/uL — CL (ref 150–400)
Platelets: <5 10*3/uL — CL (ref 150–400)
RBC: 2.17 MIL/uL — ABNORMAL LOW (ref 4.22–5.81)
RBC: 1.74 MIL/uL — ABNORMAL LOW (ref 4.22–5.81)
RDW: 14.2 % (ref 11.5–15.5)
RDW: 14.2 % (ref 11.5–15.5)
WBC Morphology: ABNORMAL
WBC: 11 10*3/uL — ABNORMAL HIGH (ref 4.0–10.5)
WBC: 10.9 10*3/uL — ABNORMAL HIGH (ref 4.0–10.5)
nRBC: 0 % (ref 0.0–0.2)
nRBC: 0.2 % (ref 0.0–0.2)

## 2020-04-30 LAB — URINALYSIS, ROUTINE W REFLEX MICROSCOPIC
Bacteria, UA: NONE SEEN
Bilirubin Urine: NEGATIVE
Glucose, UA: NEGATIVE mg/dL
Ketones, ur: NEGATIVE mg/dL
Leukocytes,Ua: NEGATIVE
Nitrite: NEGATIVE
Protein, ur: 30 mg/dL — AB
Specific Gravity, Urine: 1.017 (ref 1.005–1.030)
pH: 5 (ref 5.0–8.0)

## 2020-04-30 LAB — BPAM PLATELET PHERESIS
Blood Product Expiration Date: 202106172359
Blood Product Expiration Date: 202106172359
Unit Type and Rh: 6200
Unit Type and Rh: 6200

## 2020-04-30 LAB — APTT: aPTT: 34 seconds (ref 24–36)

## 2020-04-30 LAB — PROTIME-INR
INR: 1.5 — ABNORMAL HIGH (ref 0.8–1.2)
Prothrombin Time: 17.7 seconds — ABNORMAL HIGH (ref 11.4–15.2)

## 2020-04-30 LAB — SARS CORONAVIRUS 2 BY RT PCR (HOSPITAL ORDER, PERFORMED IN ~~LOC~~ HOSPITAL LAB): SARS Coronavirus 2: NEGATIVE

## 2020-04-30 LAB — LACTIC ACID, PLASMA: Lactic Acid, Venous: 1.4 mmol/L (ref 0.5–1.9)

## 2020-04-30 LAB — PATHOLOGIST SMEAR REVIEW

## 2020-04-30 MED ORDER — VANCOMYCIN HCL IN DEXTROSE 1-5 GM/200ML-% IV SOLN: 1000.0000 mg | Freq: Once | INTRAVENOUS | Status: DC

## 2020-04-30 MED ORDER — SODIUM CHLORIDE 0.9 % IV SOLN
1000.0000 mL | INTRAVENOUS | Status: DC
  Administered 2020-04-30: 1000 mL via INTRAVENOUS

## 2020-04-30 MED ORDER — SODIUM CHLORIDE 0.9 % IV SOLN
10.0000 mL/h | Freq: Once | INTRAVENOUS | Status: AC
  Administered 2020-04-30: 10 mL/h via INTRAVENOUS

## 2020-04-30 MED ORDER — ACETAMINOPHEN 325 MG PO TABS
650.0000 mg | ORAL_TABLET | Freq: Once | ORAL | Status: AC
  Administered 2020-04-30: 650 mg via ORAL
  Filled 2020-04-30: qty 2

## 2020-04-30 MED ORDER — SODIUM CHLORIDE 0.9 % IV SOLN: 10.0000 mL/h | Freq: Once | INTRAVENOUS | Status: DC

## 2020-04-30 MED ORDER — SODIUM CHLORIDE 0.9 % IV SOLN
2.0000 g | Freq: Once | INTRAVENOUS | Status: AC
  Administered 2020-04-30: 2 g via INTRAVENOUS
  Filled 2020-04-30: qty 2

## 2020-04-30 MED ORDER — VANCOMYCIN HCL 2000 MG/400ML IV SOLN
2000.0000 mg | Freq: Once | INTRAVENOUS | Status: AC
  Administered 2020-04-30: 2000 mg via INTRAVENOUS
  Filled 2020-04-30: qty 400

## 2020-04-30 MED ORDER — METRONIDAZOLE IN NACL 5-0.79 MG/ML-% IV SOLN
500.0000 mg | Freq: Once | INTRAVENOUS | Status: AC
  Administered 2020-04-30: 500 mg via INTRAVENOUS
  Filled 2020-04-30: qty 100

## 2020-04-30 MED ORDER — SODIUM CHLORIDE 0.9 % IV BOLUS
500.0000 mL | Freq: Once | INTRAVENOUS | Status: AC
  Administered 2020-04-30: 500 mL via INTRAVENOUS

## 2020-04-30 NOTE — ED Triage Notes (Signed)
Pt reports weakness, dizziness, and near syncope. Pt has hx of Leukemia, states currently in remission. Bloodwork drawn yesterday with resulting low platelets. Pt scheduled for blood transfusion today at 10am and requested to be brought here. Per EMS, fever of 101.1. Last tylenol around 3am. Ambulatory from stretcher to bed.

## 2020-04-30 NOTE — ED Notes (Addendum)
Date and time results received: 04/30/20 8:14 AM  (use smartphrase ".now" to insert current time)  Test: hemoglobin Critical Value: 5.3  Name of Provider Notified: Rogene Houston MD  Orders Received? Or Actions Taken?: na

## 2020-04-30 NOTE — ED Notes (Signed)
Pt is aware we need urine sample, urinal at bedside.  

## 2020-04-30 NOTE — ED Provider Notes (Signed)
MSE was initiated and I personally evaluated the patient and placed orders (if any) at  6:58 AM on April 30, 2020.  The patient appears stable so that the remainder of the MSE may be completed by another provider.  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  There is a slight cough but no other localizing symptoms.  Sepsis work-up is ordered.   Delora Fuel, MD 71/21/97 [phone]

## 2020-04-30 NOTE — ED Provider Notes (Addendum)
[facility] EMERGENCY DEPARTMENT Provider Note   CSN: [id number] Arrival date & time: 04/30/20  8250     History Chief Complaint  Patient presents with   Near Syncope    Bryce Hamilton is a 54 y.o. male.  Patient with a history of AML with recent relapse.  Followed by hematology oncology at [facility].  He was scheduled for admission to start chemotherapy on Monday by the hematology oncology service Dr. Florene Glen.  Patient also was scheduled today to receive a unit of platelets and 1 unit of packed red blood cells.  For labs that showed anemia and thrombocytopenia.  Labs from yesterday show that his platelet count was 6000.  Patient last evening started with fevers.  And patient was feeling lightheaded.  Thought maybe was in a pass out.  He arrived here with a temp of 101.  Not hypotensive.  Oxygen sats are normal.  Tachycardic with a heart rate of 110.        Past Medical History:  Diagnosis Date   Hyperlipidemia    Hypertension    Leukemia ([place])     Patient Active Problem List   Diagnosis Date Noted   E coli ESBL bacteremia in Neutropenic Patient 07/24/2018   Febrile neutropenia ([place]) 07/23/2018   Pancytopenia due to chemotherapy ([place]) 07/23/2018   Hyperbilirubinemia 07/23/2018   Hypertension 07/23/2018   Hypomagnesemia 07/23/2018   Sinus tachycardia 07/23/2018   AML (acute myeloid leukemia) with failed remission ([place]) 06/01/2018   CAP (community acquired pneumonia) 01/21/2018   Thrombocytopenia ([place]) 01/21/2018   Anemia 01/21/2018   Leukocytosis 01/21/2018   Generalized weakness 01/21/2018   Hemolytic anemia ([place]) 01/21/2018   Special screening for malignant neoplasms, colon    Hyperlipidemia 09/27/2015    Past Surgical History:  Procedure Laterality Date   APPENDECTOMY     has had scar tissue removed in that area since appendectomy   CHOLECYSTECTOMY     COLONOSCOPY N/A 02/10/2016   Procedure: COLONOSCOPY;  Surgeon: Danie Binder, MD;  Location: [facility];  Service: Endoscopy;  Laterality: N/A;  1:45 PM       Family History  Problem Relation Age of Onset   Hypertension Mother    Diabetes Father    Leukemia Father     Social History   Tobacco Use   Smoking status: Never Smoker   Smokeless tobacco: Never Used  Vaping Use   Vaping Use: Never used  Substance Use Topics   Alcohol use: No   Drug use: No    Home Medications Prior to Admission medications   Medication Sig Start Date End Date Taking? Authorizing Provider  acyclovir (ZOVIRAX) 400 MG tablet Take 1 tablet (400 mg total) by mouth 2 times daily while neutropenic. Your physician will instruct you when to START and STOP taking. **Please start 04/24/18** 04/23/18   [provider]  amLODipine (NORVASC) 5 MG tablet TAKE 1 TABLET BY MOUTH EVERYDAY AT BEDTIME 02/10/20   Mikey Kirschner, MD  famotidine (PEPCID) 40 MG tablet Take one tablet po q am Patient not taking: Reported on 11/05/2019 05/31/19   Mikey Kirschner, MD  fluconazole (DIFLUCAN) 200 MG tablet Take 1 tablet (200 mg total) by mouth daily while neutropenic. Your physician will instruct you when to START and STOP taking. **Please start 04/24/18** 04/23/18   [provider]  pantoprazole (PROTONIX) 40 MG tablet Take 40 mg by mouth daily.  02/28/18   [provider]  sildenafil (REVATIO) 20 MG  tablet TAKE 3-5 TABLETS BY MOUTH 1-2 HOURS PRIOR TO SEX (NOT COVERED) Patient not taking: Reported on 11/05/2019 09/12/19   Mikey Kirschner, MD    Allergies    Patient has no known allergies.  Review of Systems   Review of Systems  Constitutional: Positive for chills and fever.  HENT: Negative for rhinorrhea and sore throat.   Eyes: Negative for visual disturbance.  Respiratory: Negative for cough and shortness of breath.   Cardiovascular: Negative for chest pain and leg swelling.  Gastrointestinal: Negative for abdominal pain, diarrhea, nausea and  vomiting.  Genitourinary: Negative for dysuria.  Musculoskeletal: Negative for back pain and neck pain.  Skin: Negative for rash.  Neurological: Negative for dizziness, light-headedness and headaches.  Hematological: Does not bruise/bleed easily.  Psychiatric/Behavioral: Negative for confusion.    Physical Exam Updated Vital Signs BP 134/74 (BP Location: Right Arm)    Pulse (!) 110    Temp (!) 101.1 F (38.4 C) (Oral)    Resp 18    Ht 1.854 m (6\' 1" )    Wt 102.5 kg    SpO2 97%    BMI 29.82 kg/m   Physical Exam Vitals and nursing note reviewed.  Constitutional:      General: He is not in acute distress.    Appearance: Normal appearance. He is well-developed. He is not ill-appearing.  HENT:     Head: Normocephalic and atraumatic.  Eyes:     Extraocular Movements: Extraocular movements intact.     Conjunctiva/sclera: Conjunctivae normal.     Pupils: Pupils are equal, round, and reactive to light.     Comments: Sclerae pale  Cardiovascular:     Rate and Rhythm: Normal rate and regular rhythm.     Heart sounds: No murmur heard.   Pulmonary:     Effort: Pulmonary effort is normal. No respiratory distress.     Breath sounds: Normal breath sounds.  Abdominal:     Palpations: Abdomen is soft.     Tenderness: There is no abdominal tenderness.  Musculoskeletal:        General: No swelling. Normal range of motion.     Cervical back: Neck supple.  Skin:    General: Skin is warm and dry.  Neurological:     General: No focal deficit present.     Mental Status: He is alert and oriented to person, place, and time.     ED Results / Procedures / Treatments   Labs (all labs ordered are listed, but only abnormal results are displayed) Labs Reviewed  CULTURE, BLOOD (ROUTINE X 2)  CULTURE, BLOOD (ROUTINE X 2)  URINE CULTURE  LACTIC ACID, PLASMA  LACTIC ACID, PLASMA  COMPREHENSIVE METABOLIC PANEL  CBC WITH DIFFERENTIAL/PLATELET  APTT  PROTIME-INR  URINALYSIS, ROUTINE W REFLEX  MICROSCOPIC    EKG EKG Interpretation  Date/Time:  Thursday April 30 2020 06:48:00 EDT Ventricular Rate:  110 PR Interval:    QRS Duration: 87 QT Interval:  346 QTC Calculation: 468 R Axis:   30 Text Interpretation: Sinus tachycardia Confirmed by Fredia Sorrow [phone]) on 04/30/2020 7:17:19 AM   Radiology No results found.  Procedures Procedures (including critical care time)   CRITICAL CARE Performed by: Fredia Sorrow Total critical care time: 90 minutes Critical care time was exclusive of separately billable procedures and treating other patients. Critical care was necessary to treat or prevent imminent or life-threatening deterioration. Critical care was time spent personally by me on the following activities: development of treatment plan with patient  and/or surrogate as well as nursing, discussions with consultants, evaluation of patient's response to treatment, examination of patient, obtaining history from patient or surrogate, ordering and performing treatments and interventions, ordering and review of laboratory studies, ordering and review of radiographic studies, pulse oximetry and re-evaluation of patient's condition.   Medications Ordered in ED Medications - No data to display  ED Course  I have reviewed the triage vital signs and the nursing notes.  Pertinent labs & imaging results that were available during my care of the patient were reviewed by me and considered in my medical decision making (see chart for details).    MDM Rules/Calculators/A&P                         Patient with relapse acute myelogenous leukemia, presenting today with fever and chest x-ray showing a left hilar infiltrate.  Patient with significant anemia hemoglobin 5.3.  Platelets less than 5.  Patient was scheduled for transfusion of platelets and RBCs today and then was scheduled to be admitted to [facility] where he is followed by Dr. Florene Glen on Monday to resume chemotherapy.  Based on  the fever of 101 and the chest x-ray finding patient started on broad-spectrum antibiotics and be due to his low platelets and hemoglobin.  White blood cell count not bad today.  Lactic acid is normal.  Patient not hypotensive is tachycardic.  Patient will receive 500 cc bolus will receive 2 units of blood 1 unit of platelets.  Contacted Dr. Florene Glen at [facility] and they want to arrange admission.  They agree with the broad-spectrum antibiotics he has received so far.  And they agree with doing the blood transfusion and platelet transfusions here.  They are working on a bed.  They are expecting patient to be admitted there for 4 weeks to go through his chemo trial.  Mina Marble will be back with [place] when they have a bed available.   Our plan is to try to transfuse him at least 1 unit of packed red blood cells here and 1 unit of platelets.  Otherwise since they are already prepared by the blood bank that will be lost.  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

## 2020-04-30 NOTE — ED Notes (Signed)
Date and time results received: 04/30/20 8:15 AM  (use smartphrase ".now" to insert current time)  Test: platelet count Critical Value: >5  Name of Provider Notified: Zackowski MD  Orders Received? Or Actions Taken?: na

## 2020-05-01 LAB — BPAM RBC
Blood Product Expiration Date: 202107042359
Blood Product Expiration Date: 202107152359
Blood Product Expiration Date: 202107152359
ISSUE DATE / TIME: 202106170909
Unit Type and Rh: 5100
Unit Type and Rh: 5100
Unit Type and Rh: 5100

## 2020-05-01 LAB — TYPE AND SCREEN
ABO/RH(D): O POS
Antibody Screen: NEGATIVE
Unit division: 0
Unit division: 0
Unit division: 0

## 2020-05-01 LAB — URINE CULTURE: Culture: <10000 — AB

## 2020-05-05 LAB — CULTURE, BLOOD (ROUTINE X 2)
Culture: NO GROWTH
Culture: NO GROWTH
Special Requests: ADEQUATE
Special Requests: ADEQUATE

## 2020-05-14 DEATH — deceased

## 2021-07-21 IMAGING — DX DG CHEST 1V PORT
1 series · 1 of 1 positions shown · non-contrast
Comparison: 07/23/2018

CLINICAL DATA: Fever

EXAM:
PORTABLE CHEST 1 VIEW

[chest ap]
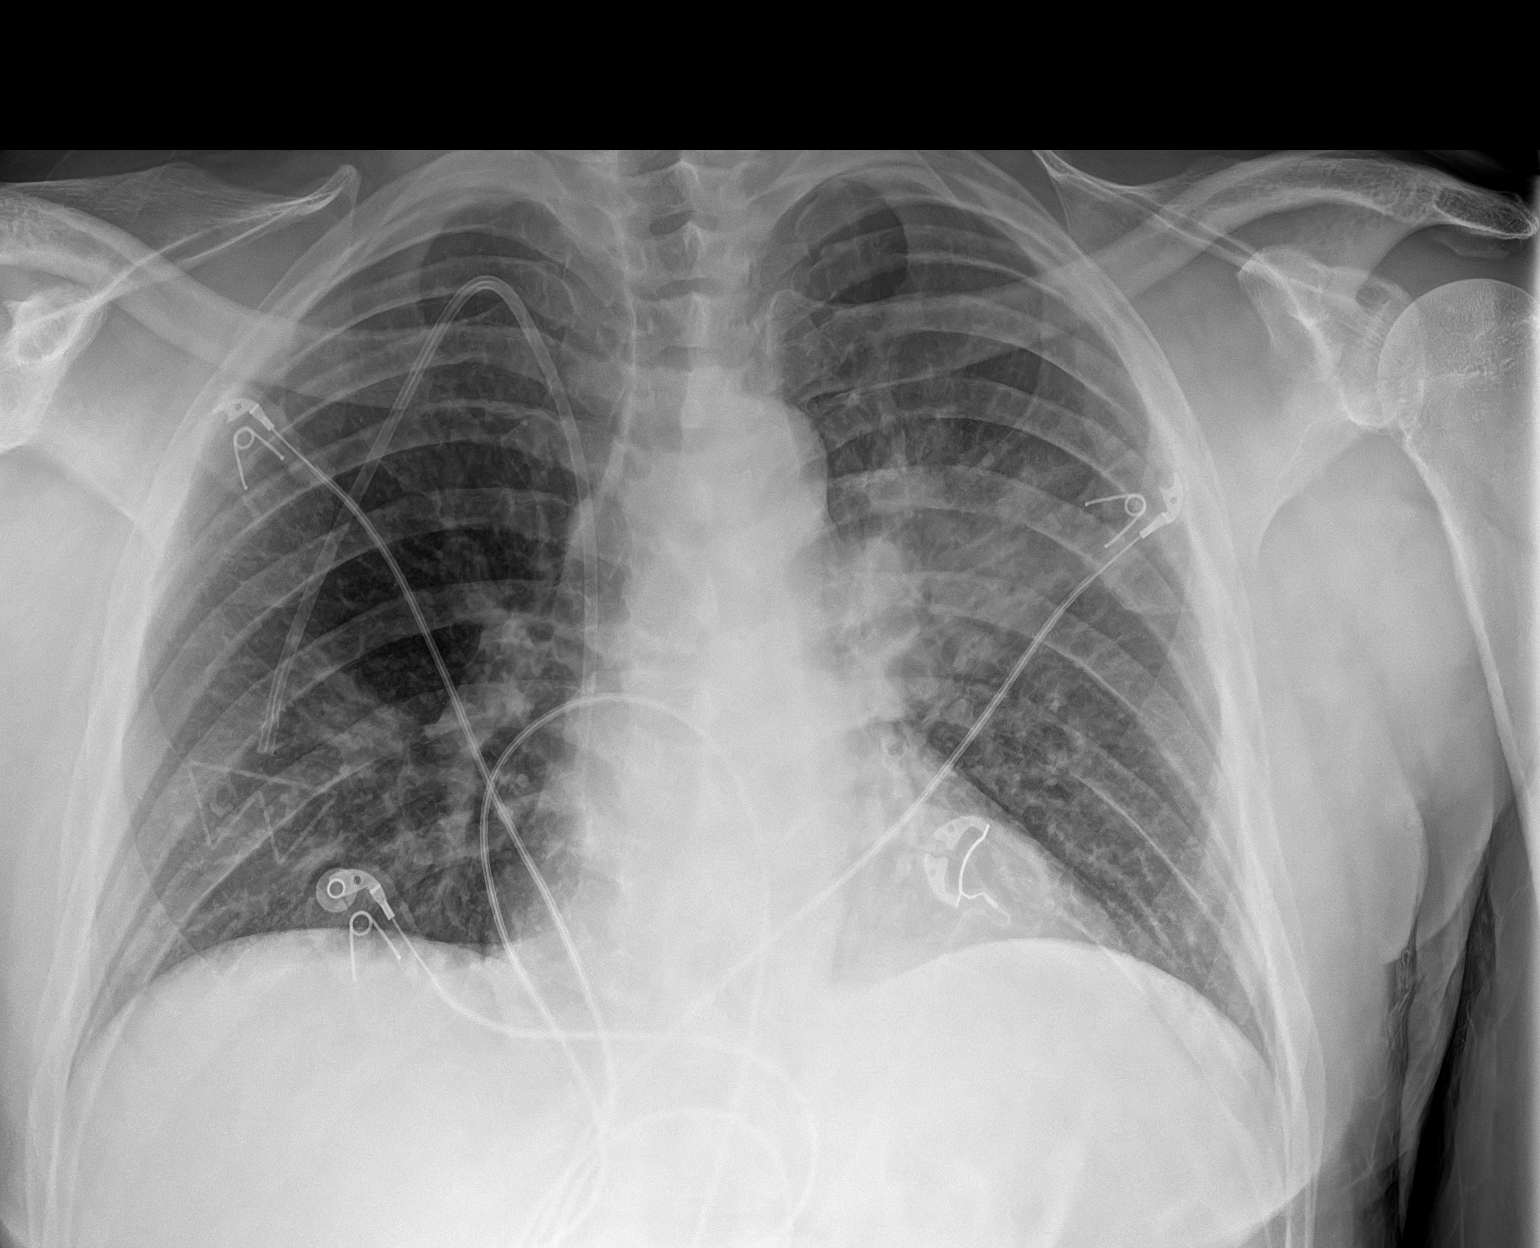

[1 of 1 positions shown; findings below may reference images not displayed]

FINDINGS: Dense left perihilar airspace disease. Borderline reticular density
at the right base. Normal heart size. No visible effusion and no
pneumothorax. Porta catheter with tip at the upper cavoatrial
junction.
IMPRESSION: Left perihilar pneumonia.
